# Patient Record
Sex: Male | Born: 1955 | Race: White | Hispanic: No | Marital: Married | Smoking: Current every day smoker
Health system: Southern US, Community
[De-identification: ages and names within clinical notes are randomized; demographics above are authoritative.]

## PROBLEM LIST (undated history)

## (undated) DIAGNOSIS — I719 Aortic aneurysm of unspecified site, without rupture: Secondary | ICD-10-CM

## (undated) DIAGNOSIS — E119 Type 2 diabetes mellitus without complications: Secondary | ICD-10-CM

## (undated) DIAGNOSIS — Z87891 Personal history of nicotine dependence: Secondary | ICD-10-CM

## (undated) DIAGNOSIS — Z720 Tobacco use: Secondary | ICD-10-CM

## (undated) DIAGNOSIS — IMO0001 Reserved for inherently not codable concepts without codable children: Secondary | ICD-10-CM

## (undated) DIAGNOSIS — Z794 Long term (current) use of insulin: Secondary | ICD-10-CM

## (undated) DIAGNOSIS — G4733 Obstructive sleep apnea (adult) (pediatric): Secondary | ICD-10-CM

## (undated) DIAGNOSIS — R1030 Lower abdominal pain, unspecified: Secondary | ICD-10-CM

## (undated) DIAGNOSIS — M62830 Muscle spasm of back: Secondary | ICD-10-CM

## (undated) DIAGNOSIS — M25552 Pain in left hip: Secondary | ICD-10-CM

## (undated) DIAGNOSIS — R109 Unspecified abdominal pain: Secondary | ICD-10-CM

## (undated) DIAGNOSIS — Z0000 Encounter for general adult medical examination without abnormal findings: Secondary | ICD-10-CM

## (undated) DIAGNOSIS — F419 Anxiety disorder, unspecified: Secondary | ICD-10-CM

## (undated) DIAGNOSIS — R079 Chest pain, unspecified: Secondary | ICD-10-CM

## (undated) DIAGNOSIS — I2109 ST elevation (STEMI) myocardial infarction involving other coronary artery of anterior wall: Secondary | ICD-10-CM

## (undated) DIAGNOSIS — I4891 Unspecified atrial fibrillation: Secondary | ICD-10-CM

## (undated) DIAGNOSIS — F09 Unspecified mental disorder due to known physiological condition: Secondary | ICD-10-CM

## (undated) DIAGNOSIS — R030 Elevated blood-pressure reading, without diagnosis of hypertension: Secondary | ICD-10-CM

## (undated) HISTORY — PX: CORONARY ANGIOPLASTY: SHX604

## (undated) HISTORY — DX: Unspecified mental disorder due to known physiological condition: F09

## (undated) HISTORY — DX: Anxiety disorder, unspecified: F41.9

## (undated) HISTORY — DX: Unspecified atrial fibrillation: I48.91

## (undated) HISTORY — DX: Tobacco use: Z72.0

## (undated) HISTORY — DX: Chest pain, unspecified: R07.9

## (undated) HISTORY — DX: Reserved for inherently not codable concepts without codable children: IMO0001

## (undated) HISTORY — DX: Elevated blood-pressure reading, without diagnosis of hypertension: R03.0

---

## 2005-12-08 HISTORY — PX: LAPAROSCOPIC CHOLECYSTECTOMY: SUR755

## 2006-02-02 ENCOUNTER — Emergency Department (HOSPITAL_COMMUNITY): Admission: EM | Admit: 2006-02-02 | Discharge: 2006-02-02 | Payer: Self-pay | Admitting: Emergency Medicine

## 2006-05-27 ENCOUNTER — Ambulatory Visit: Payer: Self-pay | Admitting: Cardiology

## 2006-10-19 ENCOUNTER — Emergency Department (HOSPITAL_COMMUNITY): Admission: EM | Admit: 2006-10-19 | Discharge: 2006-10-19 | Payer: Self-pay | Admitting: Emergency Medicine

## 2008-12-08 HISTORY — PX: CORONARY ARTERY BYPASS GRAFT: SHX141

## 2009-05-30 ENCOUNTER — Encounter: Payer: Self-pay | Admitting: Cardiology

## 2009-05-30 ENCOUNTER — Inpatient Hospital Stay (HOSPITAL_COMMUNITY): Admission: EM | Admit: 2009-05-30 | Discharge: 2009-05-30 | Payer: Self-pay | Admitting: Emergency Medicine

## 2009-06-07 DIAGNOSIS — I2109 ST elevation (STEMI) myocardial infarction involving other coronary artery of anterior wall: Secondary | ICD-10-CM

## 2009-06-07 HISTORY — PX: CARDIAC CATHETERIZATION: SHX172

## 2009-06-07 HISTORY — DX: ST elevation (STEMI) myocardial infarction involving other coronary artery of anterior wall: I21.09

## 2009-06-12 ENCOUNTER — Inpatient Hospital Stay (HOSPITAL_COMMUNITY): Admission: EM | Admit: 2009-06-12 | Discharge: 2009-06-19 | Payer: Self-pay | Admitting: Emergency Medicine

## 2009-06-12 ENCOUNTER — Encounter (INDEPENDENT_AMBULATORY_CARE_PROVIDER_SITE_OTHER): Payer: Self-pay | Admitting: *Deleted

## 2009-06-13 ENCOUNTER — Encounter: Payer: Self-pay | Admitting: Cardiology

## 2009-06-13 ENCOUNTER — Encounter (INDEPENDENT_AMBULATORY_CARE_PROVIDER_SITE_OTHER): Payer: Self-pay | Admitting: Cardiovascular Disease

## 2009-06-13 ENCOUNTER — Ambulatory Visit: Payer: Self-pay | Admitting: Cardiothoracic Surgery

## 2009-06-15 ENCOUNTER — Encounter: Payer: Self-pay | Admitting: Cardiothoracic Surgery

## 2009-06-15 ENCOUNTER — Encounter: Payer: Self-pay | Admitting: Cardiology

## 2009-06-16 ENCOUNTER — Encounter: Payer: Self-pay | Admitting: Cardiology

## 2009-06-19 ENCOUNTER — Encounter: Payer: Self-pay | Admitting: Cardiothoracic Surgery

## 2009-07-18 ENCOUNTER — Encounter: Admission: RE | Admit: 2009-07-18 | Discharge: 2009-07-18 | Payer: Self-pay | Admitting: Cardiothoracic Surgery

## 2009-07-18 ENCOUNTER — Encounter: Payer: Self-pay | Admitting: Cardiology

## 2009-07-18 ENCOUNTER — Ambulatory Visit: Payer: Self-pay | Admitting: Cardiothoracic Surgery

## 2009-12-17 ENCOUNTER — Encounter: Payer: Self-pay | Admitting: Cardiology

## 2009-12-23 ENCOUNTER — Encounter: Payer: Self-pay | Admitting: Cardiology

## 2009-12-25 ENCOUNTER — Encounter: Payer: Self-pay | Admitting: Cardiology

## 2009-12-26 DIAGNOSIS — I429 Cardiomyopathy, unspecified: Secondary | ICD-10-CM

## 2009-12-26 DIAGNOSIS — F4323 Adjustment disorder with mixed anxiety and depressed mood: Secondary | ICD-10-CM

## 2009-12-26 DIAGNOSIS — I472 Ventricular tachycardia, unspecified: Secondary | ICD-10-CM | POA: Insufficient documentation

## 2009-12-26 DIAGNOSIS — F17200 Nicotine dependence, unspecified, uncomplicated: Secondary | ICD-10-CM | POA: Insufficient documentation

## 2009-12-26 DIAGNOSIS — R072 Precordial pain: Secondary | ICD-10-CM | POA: Insufficient documentation

## 2009-12-28 ENCOUNTER — Ambulatory Visit: Payer: Self-pay | Admitting: Cardiology

## 2009-12-28 DIAGNOSIS — R0989 Other specified symptoms and signs involving the circulatory and respiratory systems: Secondary | ICD-10-CM | POA: Insufficient documentation

## 2009-12-28 DIAGNOSIS — I2589 Other forms of chronic ischemic heart disease: Secondary | ICD-10-CM

## 2010-01-01 ENCOUNTER — Encounter: Payer: Self-pay | Admitting: Cardiology

## 2010-01-07 ENCOUNTER — Encounter (INDEPENDENT_AMBULATORY_CARE_PROVIDER_SITE_OTHER): Payer: Self-pay | Admitting: *Deleted

## 2010-01-23 ENCOUNTER — Encounter: Payer: Self-pay | Admitting: Cardiology

## 2010-01-28 ENCOUNTER — Ambulatory Visit: Payer: Self-pay | Admitting: Cardiology

## 2010-02-28 IMAGING — CR DG CHEST 1V PORT
1 series · 1 of 1 positions shown · non-contrast
Comparison: Portable exam 1115 hours compared to 05/30/2009

CLINICAL DATA: Unstable angina, chest pain, left arm numbness

PORTABLE CHEST - 1 VIEW

[AP]
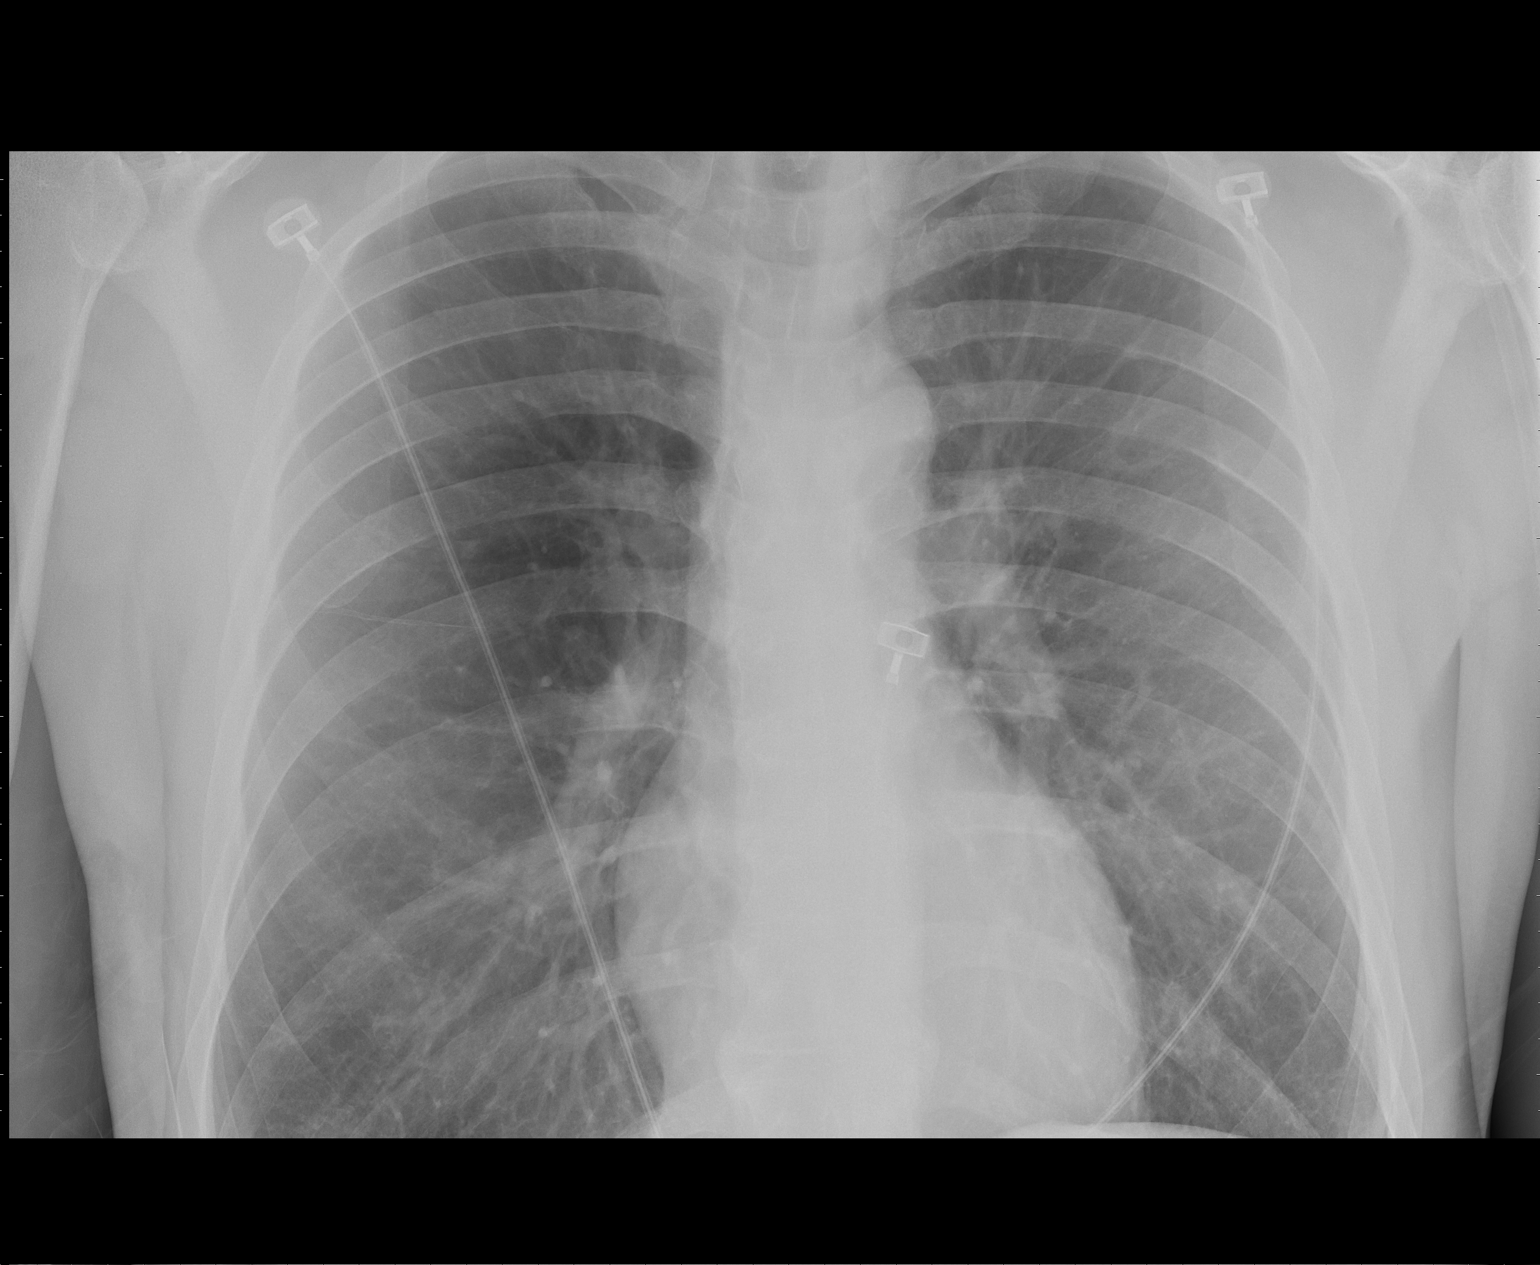

[1 of 1 positions shown; findings below may reference images not displayed]

FINDINGS: Normal heart size, mediastinal contours, pulmonary vascularity.
Lungs clear.
Question mild underlying emphysematous changes.
IMPRESSION: No acute abnormalities.

## 2010-03-06 IMAGING — CR DG CHEST 2V
2 series · 2 of 2 positions shown · non-contrast
Comparison: 06/17/2009

CLINICAL DATA: Coronary artery disease. Status post CABG.

CHEST - 2 VIEW

[w chest pa]
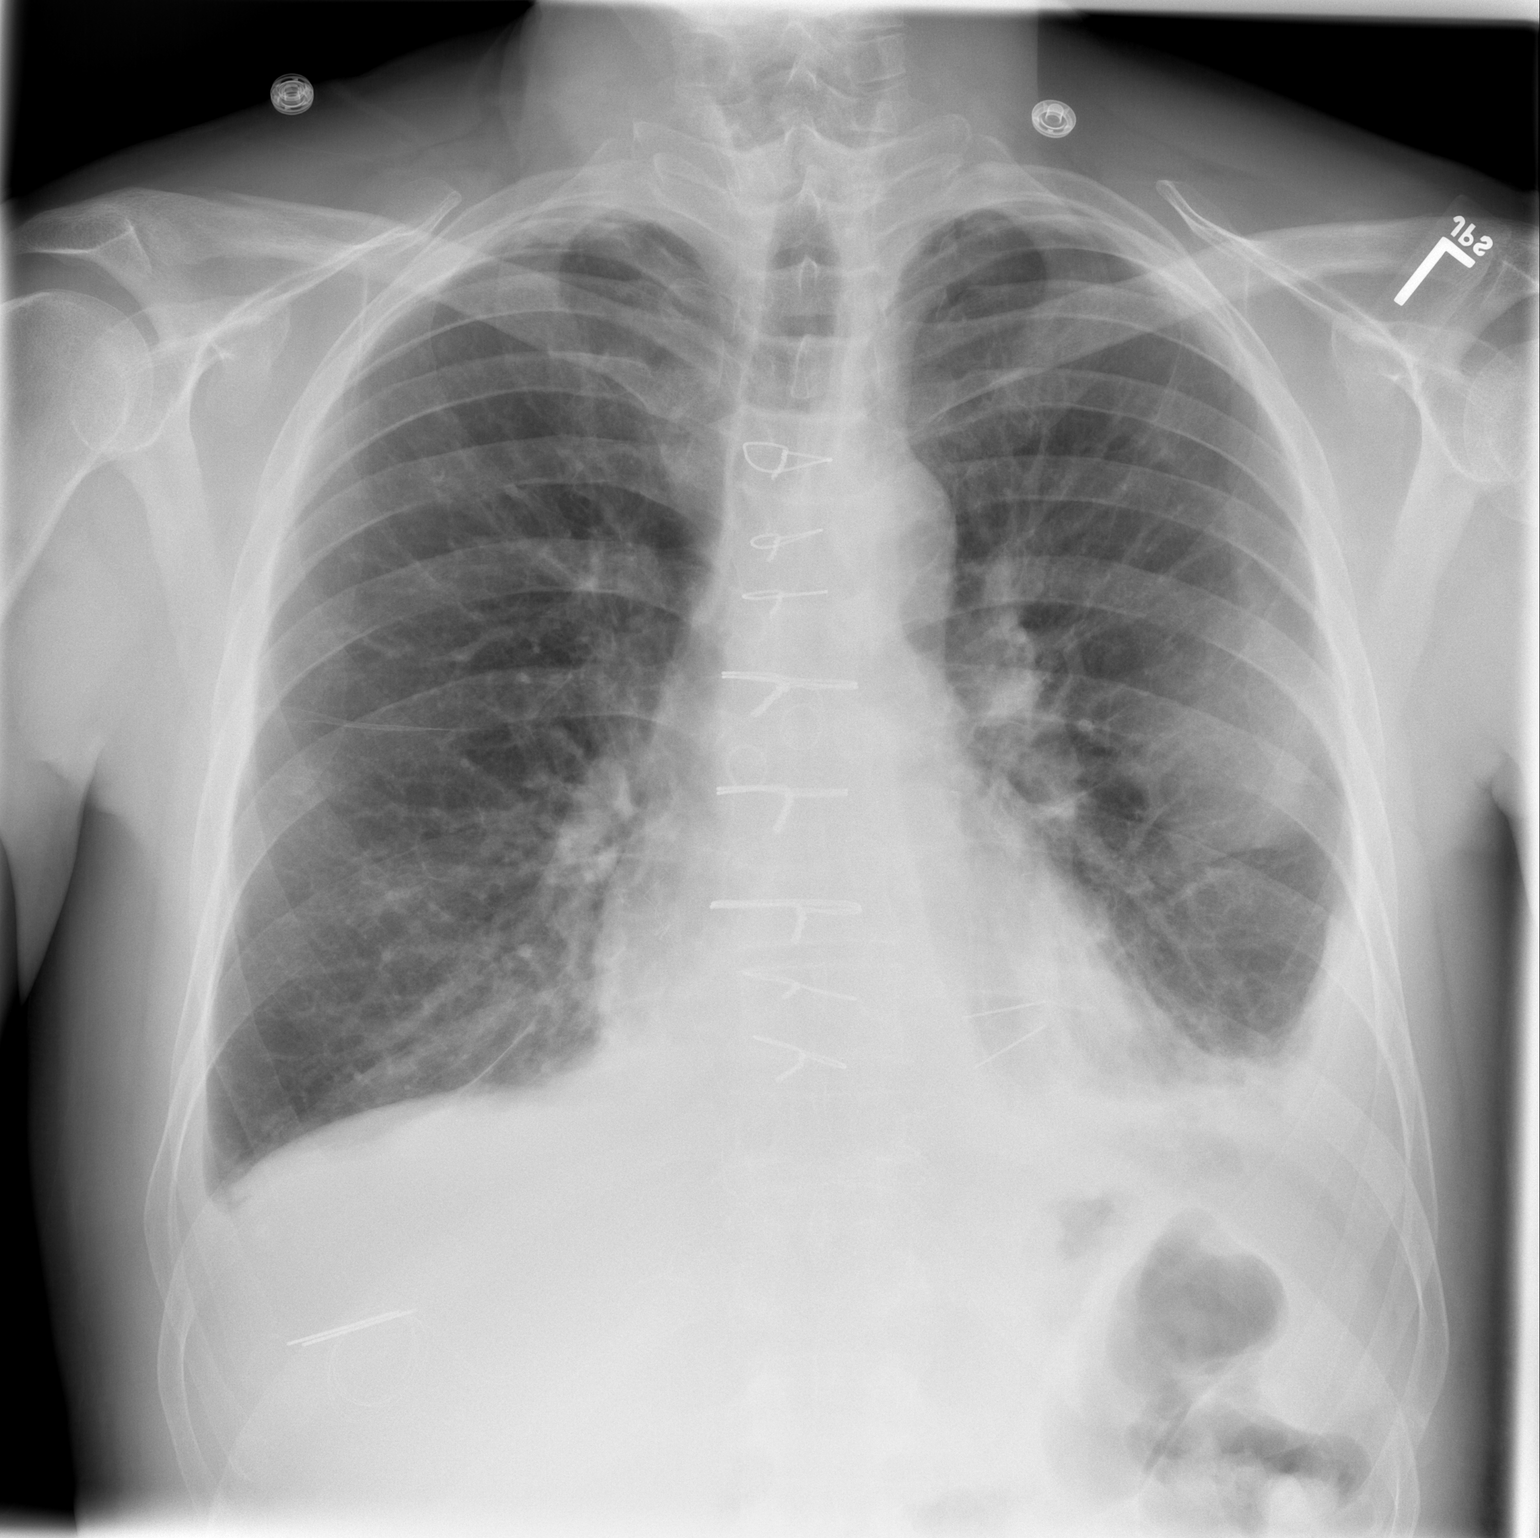

[w chest lat]
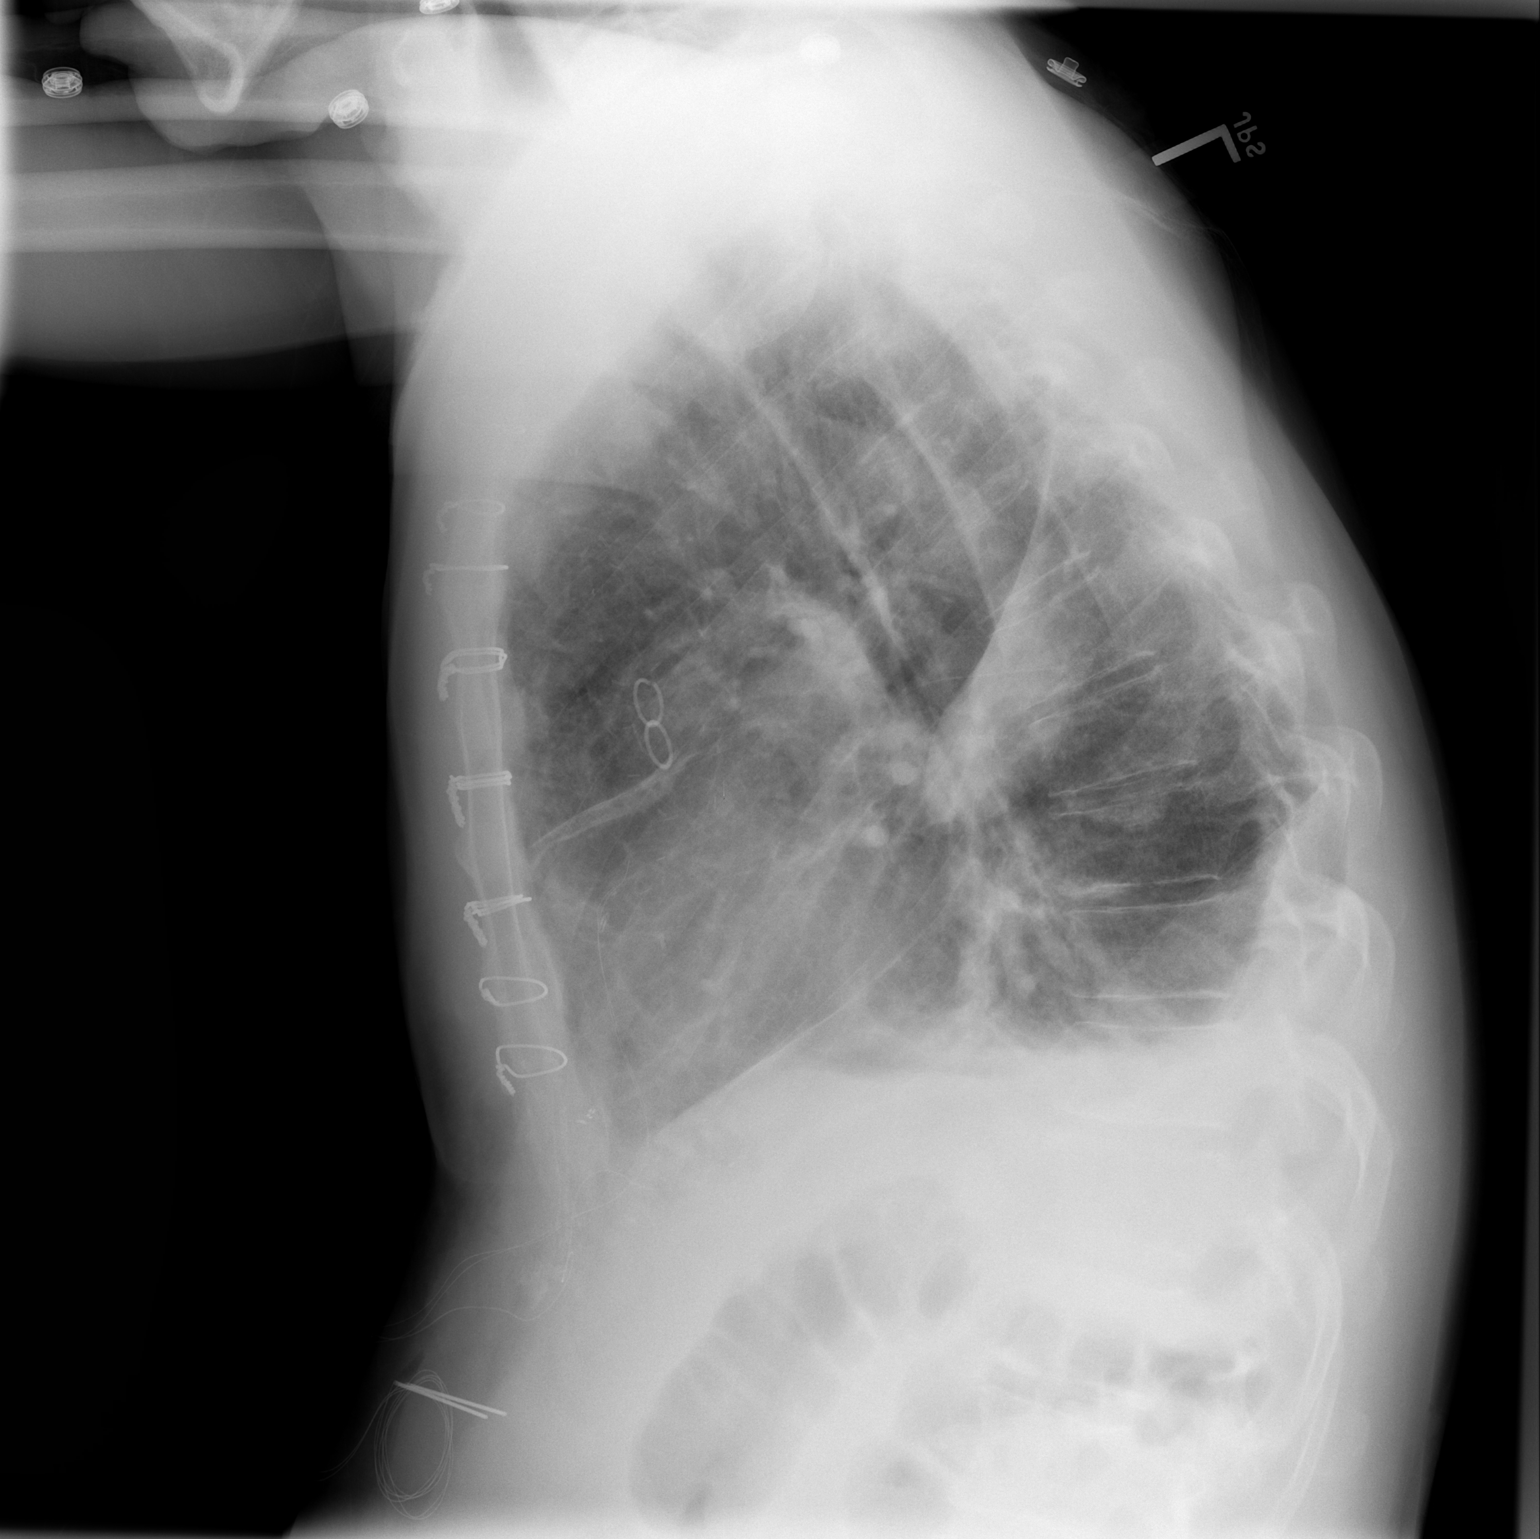

[2 of 2 positions shown; findings below may reference images not displayed]

FINDINGS: The left-sided chest tube has been removed.  There is no
pneumothorax.

The small left effusion has slightly increased.  There is fluid
loculated along the posterior-superior aspect of the left major
fissure.  There is a small right effusion.  Heart size and
vascularity are normal.  There is mild atelectasis at the bases
medially.
IMPRESSION: No pneumothorax after chest tube removal.  Small effusions.  Mild
bibasilar atelectasis.

## 2011-01-09 NOTE — Letter (Signed)
Summary: Discharge Summary  Discharge Summary   Imported By: Zachary George 12/27/2009 11:35:00  _____________________________________________________________________  External Attachment:    Type:   Image     Comment:   External Document

## 2011-01-09 NOTE — Letter (Signed)
Summary: Discharge Summary  Discharge Summary   Imported By: Zachary George 12/27/2009 11:35:54  _____________________________________________________________________  External Attachment:    Type:   Image     Comment:   External Document

## 2011-01-09 NOTE — Letter (Signed)
Summary: External Correspondence/ OFFICE NOTE DR. VYAS  External Correspondence/ OFFICE NOTE DR. VYAS   Imported By: Dorise Hiss 12/26/2009 16:35:31  _____________________________________________________________________  External Attachment:    Type:   Image     Comment:   External Document

## 2011-01-09 NOTE — Procedures (Signed)
Summary: Holter and Event  Holter and Event   Imported By: Zachary George 12/26/2009 16:34:16  _____________________________________________________________________  External Attachment:    Type:   Image     Comment:   External Document

## 2011-01-09 NOTE — Assessment & Plan Note (Signed)
Summary: 1 MO FU -SRS   Visit Type:  Follow-up Primary Provider:  Sherril Croon  CC:  follow-up visit.  History of Present Illness: the patient is a 55 year old male with a history of an ischemic cardiomyopathy ejection fraction 40%. The patient is status large anterior wall myocardial infarction July 2010. The patient underwent coronary artery bypass grafting. Preoperatively his ejection fraction was 25%. Last office visit the patient was found to have a loud carotid bruits and carotid Dopplers were obtained. However, it was less than 50% stenosis bilaterally. The patient also reported episodes of dizziness and a CardioNet monitor was applied by his primary care physician. There was only a single episode of a 5 beat run of ventricular tachycardia with a heart rate of 192 beats per minute at that time the patient was asymptomatic. The patient also had junctional rhythm at 30 beats per minute in the early morning hours while asleep. The patient currently reports no palpitations or syncope. He denies any chest pain. He is just recovering from a recent upper respiratory tract infection and has been prescribed antibiotics by his primary care physician. The patient continues to smoke but is trying to quit.  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 Take 1 Tablet By Mouth Once A Day 2)  Carvedilol 3.125 Mg Tabs (Carvedilol) .... Take 1 Tablet By Mouth Two Times A Day 3)   Aspir-Trin 325 Mg Tbec (Aspirin) .... Take 1 Tablet By Mouth Once A Day 4)  Lisinopril 10 Mg Tabs (Lisinopril) .... Take 1 Tablet By Mouth Once A Day 5)  Amoxicillin 500 Mg Caps (Amoxicillin) .... Take 1 Tablet By Mouth Three Times A Day With Food For Ten Days  Allergies (verified): No Known Drug Allergies  Comments:  Nurse/Medical Assistant: The patient's medications and allergies were reviewed with the patient and were updated in the Medication and Allergy Lists. Bottles reviewed.  Past History:  Past Medical History: Last updated: 12/26/2009 chest pains elevated blood pressure Anxiety disorder Tobacco abuse Chronic brain syndrome per patient  Past Surgical History: Last updated: 12/26/2009 CABG x 3 Cholecystectomy 2007 Cardiac Catheterization  Family History: Last updated: 12/26/2009 + family hx of CAD  Social History: Last updated: 12/26/2009 Married with 2 children Unemployed smokes 2 packs of cigaretts per day He uses marijuana frequently  Risk Factors: Smoking Status: current (01/28/2010) Packs/Day: 1 PPD (01/28/2010)  Review of Systems  The patient denies fatigue, malaise, fever, weight gain/loss, vision loss, decreased hearing, hoarseness, chest pain, palpitations, shortness of breath, prolonged cough, wheezing, sleep apnea, coughing up blood, abdominal pain, blood in stool, nausea, vomiting, diarrhea, heartburn, incontinence, blood in urine, muscle weakness, joint pain, leg swelling, rash, skin lesions, headache, fainting, dizziness, depression, anxiety, enlarged lymph nodes, easy bruising or bleeding, and environmental allergies.    Vital Signs:  Patient profile:   55 year old male Height:      69 inches Weight:      176 pounds Pulse rate:   87 / minute BP  sitting:   108 / 71  (left arm) Cuff size:   regular  Vitals Entered By: Carlye Grippe (January 28, 2010 12:54 PM) CC: follow-up visit   Physical Exam  Additional Exam:  General:  Well-developed, well-nourished in no distress head: Normocephalic and atraumatic eyes PERRLA/EOMI intact, conjunctiva and lids normal nose: No deformity or lesions mouth normal dentition, normal posterior pharynx neck: Supple, no JVD.  No masses, thyromegaly or abnormal cervical nodes. soft bilateral carotid bruits lungs: Normal breath sounds bilaterally without wheezing.  Normal percussion heart: regular rate and rhythm with normal S1 and S2, no S3 or S4.  PMI is normal.  No pathological murmurs abdomen: Normal bowel sounds, abdomen is soft and nontender without masses, organomegaly or hernias noted.  No hepatosplenomegaly musculoskeletal: Back normal, normal gait muscle strength and tone normal pulsus: Pulse is normal in all 4 extremities Extremities: No peripheral pitting edema neurologic: Alert and oriented x 3 skin: Intact without lesions or rashes cervical nodes: No significant adenopathy psychologic: Normal affect    Impression & Recommendations:  Problem # 1:  CARDIOMYOPATHY, ISCHEMIC (ICD-414.8) the patient has no evidence of congestive heart failure. Will continue on his current medical regimen. He has had significant improvement in his ejection fraction postoperatively. The patient is also statin drug therapy and blood work can be done by his primary care physician. His updated medication list for this problem includes:    Carvedilol 3.125 Mg Tabs (Carvedilol) .Marland Kitchen... Take 1 tablet by mouth two times a day    Aspir-trin 325 Mg Tbec (Aspirin) .Marland Kitchen... Take 1 tablet by mouth once a day    Lisinopril 10 Mg Tabs (Lisinopril) .Marland Kitchen... Take 1 tablet by mouth once a day  Problem # 2:  CAROTID BRUIT (ICD-785.9) no evidence of significant carotid artery disease. Continue risk factor modification.  Problem # 3:  NONDEPENDENT TOBACCO USE DISORDER (ICD-305.1) the patient was counseled extensively regarding tobacco discontinuation.  Patient Instructions: 1)  Your physician recommends that  you continue on your current medications as directed. Please refer to the Current Medication list given to you today. 2)  Your physician wants you to follow-up in: 6months. You will receive a reminder letter in the mail about two months in advance. If you don't receive a letter, please call our office to schedule the follow-up appointment.

## 2011-01-09 NOTE — Consult Note (Signed)
Summary: Consultation Report  Consultation Report   Imported By: Zachary George 12/27/2009 11:36:36  _____________________________________________________________________  External Attachment:    Type:   Image     Comment:   External Document

## 2011-01-09 NOTE — Cardiovascular Report (Signed)
Summary: Cardiac Cath Other  Cardiac Cath Other   Imported By: Zachary George 12/27/2009 11:45:43  _____________________________________________________________________  External Attachment:    Type:   Image     Comment:   External Document

## 2011-01-09 NOTE — Assessment & Plan Note (Signed)
Summary: EST LAST SEEN 2006 CARDIOMYOPATHY   Visit Type:  Initial Consult Primary Provider:  Vyas  CC:  new patient and abnormal heart functin.  History of Present Illness: the patient is a 55 year old Dave [name] with a history of an ischemic cardiomyopathy ejection fraction currently 40%. The patient is status a large anterior wall myocardial infarction in July of 2010. He said single he underwent coronary artery bypass grafting. His ejection fraction at that time was 25%. 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 The patient does have carotid bruits on exam today. He [facility]. The patient also reported dizziness over the last several weeks. The patient was scheduled for a 2-D echocardiogram was scheduled by his primary care physician. The study was read [facility] Ejection fraction was 40% with no mention was made of focal wall motion abnormalities. The patient then also wore a CardioNet monitor which demonstrated a single episode of 5 beat run of ventricular tachycardia with heart rate of 192 beats per minute. However patient was asymptomatic. No dizziness was reported. Another strip also reveals a junctional rhythm at a heart rate of 33 beats per minute but this was recorded early in the morning and the patient was asleep. Of note is also that the patient has significant anxiety and smokes marijuana, for medical purposes. He does smoke tobacco. He denies any recent presyncope or syncope. He has had no heart failure symptoms with no shortness of breath orthopnea PND.  Preventive Screening-Counseling & Management  Alcohol-Tobacco     Smoking Status: current     Smoking Cessation Counseling: yes     Packs/Day: 1 PPD  Clinical Review Panels:  CXR CXR results  Clinical Data: Post CABG for chest  pain    CHEST - 2 VIEW    Comparison: 06/18/2009    Findings: Post CABG.  Heart normal.  Lungs have cleared.  No   pleural fluid.  Osseous structures intact.    IMPRESSION:   Post CABG - no active disease.       Released By:  Bernerd Limbo,  M.D. (07/18/2009)  Echocardiogram Echocardiogram Echocardiography  Study Conclusions    1. Left ventricle: The cavity size was normal. Wall thickness was      normal. Systolic function was normal. The estimated ejection      fraction was in the range of 50% to 55%. Wall motion was normal;      there were no regional wall motion abnormalities.   2. Mitral valve: Calcified annulus. Mild regurgitation.   Echocardiography. M-mode, complete 2D, spectral Doppler, and color   Doppler. Height: Height: 175cm. Height: 68.9in. Weight: Weight:   76kg. Weight: 167.2lb. Body mass index: BMI: Dave.8kg/m^2. Body   surface area: BSA: 1.83m^2. Patient status: Inpatient. Location:   (06/13/2009)  Cardiac Imaging Cardiac Cath Findings  CARDIAC CATHETERIZATION   IMPRESSION:   1. Severe global left ventricular dysfunction with an ejection       fraction of approximately 25% with significant hypocontractility to       akinesis involving an inferior wall hypocontractility       anterolaterally and significant hypocontractility in the       inferolateral wall.   2. Significant multivessel coronary obstructive disease with separate       ostium giving rise to the left anterior descending and left  circumflex vessels with diffuse 70% ostial proximal left anterior       descending stenosis, 60% left anterior descending stenosis after       the takeoff of the first septal and diagonal vessel and bifurcation       95% and 80% septal mid left anterior descending stenosis.   3. Large circumflex system with questionable ostial catheter spasm.   4. Total occlusion of the right coronary artery with extensive left-to-       right collaterals.   5. An 80% left  vertebral artery narrowing arising from the left       circumflex system with evidence for plaque in the proximal       subclavian system with a widely patent unbypassed left internal       mammary artery.   6. 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      RECOMMENDATIONS:  The patient will be evaluated for consideration of   CABG revascularization surgery.           (06/12/2009)    Current Medications (verified): 1)  Simvastatin 40 Mg Tabs (Simvastatin) .... Take 1 Tablet By Mouth Once A Day 2)  Carvedilol 3.125 Mg Tabs (Carvedilol) .... Take 1 Tablet By Mouth Two Times A Day 3)  Aspir-Trin 325 Mg Tbec (Aspirin) .... Take 1 Tablet By Mouth Once A Day 4)  Lisinopril 10 Mg Tabs (Lisinopril) .... Take 1 Tablet By Mouth Once A Day  Allergies (verified): No Known Drug Allergies  Comments:  Nurse/Medical Assistant: The patient's medications and allergies were reviewed with the patient and were updated in the Medication and Allergy Lists. Bottlles brought.  Past History:  Past Medical History: Last updated: 12/26/2009 chest pains elevated blood pressure Anxiety disorder Tobacco abuse Chronic brain syndrome per patient  Past Surgical History: Last updated: 12/26/2009 CABG x 3 Cholecystectomy 2007 Cardiac Catheterization  Family History: Last updated: 12/26/2009 + family hx of CAD  Social History: Last updated: 12/26/2009 Married with 2 children Unemployed smokes 2 packs of cigaretts per day He uses marijuana frequently  Risk Factors: Smoking Status: current (12/28/2009) Packs/Day: 1 PPD (12/28/2009)  Social History: Smoking Status:  current Packs/Day:  1 PPD  Review of Systems       The patient complains of dizziness.  The patient denies fatigue, malaise, fever, weight gain/loss, vision loss, decreased hearing, hoarseness, chest pain, palpitations, shortness of breath, prolonged cough,  wheezing, sleep apnea, coughing up blood, abdominal pain, blood in stool, nausea, vomiting, diarrhea, heartburn, incontinence, blood in urine, muscle weakness, joint pain, leg swelling, rash, skin lesions, headache, fainting, depression, anxiety, enlarged lymph nodes, easy bruising or bleeding, and environmental allergies.    Vital Signs:  Patient profile:   55 year old Dave [name] Height:      69 inches Weight:      185 pounds BMI:     27.42 Pulse rate:   79 / minute BP sitting:   127 / 84  (left arm) Cuff size:   regular  Vitals Entered By: Carlye Grippe (December 28, 2009 8:10 AM)  Nutrition Counseling: Patient's BMI is greater than 25 and therefore counseled on weight management options. CC: new patient,abnormal heart functin   Physical Exam  Additional Exam:  General: Well-developed, well-nourished in no distress head: Normocephalic and atraumatic eyes PERRLA/EOMI intact, conjunctiva and lids normal nose: No deformity or lesions mouth normal dentition, normal posterior pharynx neck: Supple, no  JVD.  No masses, thyromegaly or abnormal cervical nodes. Bilateral carotid bruits left greater than right lungs: Normal breath sounds bilaterally without wheezing.  Normal percussion heart: regular rate and rhythm with normal S1 and S2, no S3 or S4.  PMI is normal.  No pathological murmurs abdomen: Normal bowel sounds, abdomen is soft and nontender without masses, organomegaly or hernias noted.  No hepatosplenomegaly musculoskeletal: Back normal, normal gait muscle strength and tone normal pulsus: Pulse is normal in all 4 extremities Extremities: No peripheral pitting edema neurologic: Alert and oriented x 3 skin: Intact without lesions or rashes cervical nodes: No significant adenopathy psychologic: Normal affect    Impression & Recommendations:  Problem # 1:  CAROTID BRUIT (ICD-785.9) the patient will be scheduled for followup carotid Dopplers. He has a significant bruit particularly  on the left side. He has known disease of 60-79% proximal 6 months ago. Orders: Carotid Duplex (Carotid Duplex)  Problem # 2:  PAROXYSMAL VENTRICULAR TACHYCARDIA (ICD-427.1) the patient has a reported strip with nonsustained VT of only 5 beats. He was asymptomatic. His ejection fraction is currently 40% currently no clear indication for ICD placement. The patient however will need a followup echocardiogram in approximately in 6 months. I would prefer this performed inhospital and read  by our group. I am concerned that no mention was made of an anterior wall motion abnormality. His updated medication list for this problem includes:    Carvedilol 3.125 Mg Tabs (Carvedilol) .Marland Kitchen... Take 1 tablet by mouth two times a day    Aspir-trin 325 Mg Tbec (Aspirin) .Marland Kitchen... Take 1 tablet by mouth once a day    Lisinopril 10 Mg Tabs (Lisinopril) .Marland Kitchen... Take 1 tablet by mouth once a day  Orders: T-Basic Metabolic Panel [id number])  Problem # 3:  CARDIOMYOPATHY, ISCHEMIC (ICD-414.8) lisinopril will be up titrated to 10 mg p.o. q. day. His updated medication list for this problem includes:    Carvedilol 3.125 Mg Tabs (Carvedilol) .Marland Kitchen... Take 1 tablet by mouth two times a day    Aspir-trin 325 Mg Tbec (Aspirin) .Marland Kitchen... Take 1 tablet by mouth once a day    Lisinopril 10 Mg Tabs (Lisinopril) .Marland Kitchen... Take 1 tablet by mouth once a day  Orders: T-Basic Metabolic Panel [id number])  Problem # 4:  ADJ DISORDER WITH MIXED ANXIETY & DEPRESSED MOOD (ICD-309.28) the patient significant anxiety and uses marijuana for relief. I encouraged the patient to stop the use of marijuana as well has tobacco.  Patient Instructions: 1)  Lisinopril 10mg  daily 2)  Carotid dopplers  3)  Labs:  BMET 4)  Follow up in  1 month Prescriptions: LISINOPRIL 10 MG TABS (LISINOPRIL) Take 1 tablet by mouth once a day  #30 x 6   Entered by:   Hoover Brunette, LPN   Authorized by:   Lewayne Bunting, MD, [facility]   Signed by:   Hoover Brunette, LPN on 83/15/1761    Method used:   Electronically to        [facility] [address] (retail)       [address]       [place], [place]  [address]       Ph: [phone] or [phone]       Fax: [phone]   RxID:   [id number]

## 2011-01-09 NOTE — Letter (Signed)
Summary: Engineer, materials at [facility]  [address]   [place], [place] [address]   Phone: [phone]  Fax: [phone]        January 07, 2010 MRN: [id number]   Dave [name] [address] [place], [place]  [address]   Dear Mr. PERZ,  Your test ordered by Selena Batten has been reviewed by your physician (or physician assistant) and was found to be normal or stable. Your physician (or physician assistant) felt no changes were needed at this time.  ____ Echocardiogram  ____ Cardiac Stress Test  __X__ Lab Work  ____ Peripheral vascular study of arms, legs or neck  ____ CT scan or X-ray  ____ Lung or Breathing test  ____ Other:   Thank you.   Hoover Brunette, LPN    Duane Boston, M.D., F.A.C.C. Thressa Sheller, M.D., F.A.C.C. Oneal Grout, M.D., F.A.C.C. Cheree Ditto, M.D., F.A.C.C. Daiva Nakayama, M.D., F.A.C.C. Kenney Houseman, M.D., F.A.C.C. Jeanne Ivan, PA-C

## 2011-01-09 NOTE — Op Note (Signed)
Summary: Operative Report  Operative Report   Imported By: Zachary George 12/27/2009 11:37:24  _____________________________________________________________________  External Attachment:    Type:   Image     Comment:   External Document

## 2011-01-09 NOTE — Progress Notes (Signed)
Summary: TCT OFFICE VISIT  TCT OFFICE VISIT   Imported By: Zachary George 12/26/2009 16:49:16  _____________________________________________________________________  External Attachment:    Type:   Image     Comment:   External Document

## 2011-03-16 LAB — BLOOD GAS, ARTERIAL
Acid-base deficit: 0.3 mmol/L (ref 0.0–2.0)
Bicarbonate: 23.6 mEq/L (ref 20.0–24.0)
FIO2: 0.21 %
O2 Saturation: 95.6 %
Patient temperature: 98.6
TCO2: 24.7 mmol/L (ref 0–100)
pCO2 arterial: 36.9 mmHg (ref 35.0–45.0)
pH, Arterial: 7.421 (ref 7.350–7.450)
pO2, Arterial: 73 mmHg — ABNORMAL LOW (ref 80.0–100.0)

## 2011-03-16 LAB — TSH: TSH: 2.107 u[IU]/mL (ref 0.350–4.500)

## 2011-03-16 LAB — POCT I-STAT 4, (NA,K, GLUC, HGB,HCT)
Glucose, Bld: 103 mg/dL — ABNORMAL HIGH (ref 70–99)
Glucose, Bld: 105 mg/dL — ABNORMAL HIGH (ref 70–99)
Glucose, Bld: 81 mg/dL (ref 70–99)
Glucose, Bld: 81 mg/dL (ref 70–99)
Glucose, Bld: 82 mg/dL (ref 70–99)
Glucose, Bld: 84 mg/dL (ref 70–99)
Glucose, Bld: 91 mg/dL (ref 70–99)
HCT: 22 % — ABNORMAL LOW (ref 39.0–52.0)
HCT: 25 % — ABNORMAL LOW (ref 39.0–52.0)
HCT: 25 % — ABNORMAL LOW (ref 39.0–52.0)
HCT: 26 % — ABNORMAL LOW (ref 39.0–52.0)
HCT: 31 % — ABNORMAL LOW (ref 39.0–52.0)
HCT: 34 % — ABNORMAL LOW (ref 39.0–52.0)
HCT: 37 % — ABNORMAL LOW (ref 39.0–52.0)
Hemoglobin: 10.5 g/dL — ABNORMAL LOW (ref 13.0–17.0)
Hemoglobin: 11.6 g/dL — ABNORMAL LOW (ref 13.0–17.0)
Hemoglobin: 12.6 g/dL — ABNORMAL LOW (ref 13.0–17.0)
Hemoglobin: 7.5 g/dL — CL (ref 13.0–17.0)
Hemoglobin: 8.5 g/dL — ABNORMAL LOW (ref 13.0–17.0)
Hemoglobin: 8.5 g/dL — ABNORMAL LOW (ref 13.0–17.0)
Hemoglobin: 8.8 g/dL — ABNORMAL LOW (ref 13.0–17.0)
Potassium: 3.7 mEq/L (ref 3.5–5.1)
Potassium: 3.8 meq/L (ref 3.5–5.1)
Potassium: 3.8 meq/L (ref 3.5–5.1)
Potassium: 4 mEq/L (ref 3.5–5.1)
Potassium: 4.1 mEq/L (ref 3.5–5.1)
Potassium: 4.1 meq/L (ref 3.5–5.1)
Potassium: 4.6 meq/L (ref 3.5–5.1)
Sodium: 131 mEq/L — ABNORMAL LOW (ref 135–145)
Sodium: 131 meq/L — ABNORMAL LOW (ref 135–145)
Sodium: 132 meq/L — ABNORMAL LOW (ref 135–145)
Sodium: 134 meq/L — ABNORMAL LOW (ref 135–145)
Sodium: 136 mEq/L (ref 135–145)
Sodium: 137 mEq/L (ref 135–145)
Sodium: 137 meq/L (ref 135–145)

## 2011-03-16 LAB — PROTIME-INR
INR: 1.4 (ref 0.00–1.49)
Prothrombin Time: 17.4 seconds — ABNORMAL HIGH (ref 11.6–15.2)

## 2011-03-16 LAB — PLATELET FUNCTION ASSAY: Collagen / Epinephrine: 127 s (ref 0–180)

## 2011-03-16 LAB — URINALYSIS, ROUTINE W REFLEX MICROSCOPIC
Bilirubin Urine: NEGATIVE
Glucose, UA: NEGATIVE mg/dL
Hgb urine dipstick: NEGATIVE
Ketones, ur: NEGATIVE mg/dL
Nitrite: NEGATIVE
Protein, ur: NEGATIVE mg/dL
Specific Gravity, Urine: 1.019 (ref 1.005–1.030)
Urobilinogen, UA: 0.2 mg/dL (ref 0.0–1.0)
pH: 6.5 (ref 5.0–8.0)

## 2011-03-16 LAB — CBC
HCT: 27 % — ABNORMAL LOW (ref 39.0–52.0)
HCT: 27.6 % — ABNORMAL LOW (ref 39.0–52.0)
HCT: 27.8 % — ABNORMAL LOW (ref 39.0–52.0)
HCT: 30.1 % — ABNORMAL LOW (ref 39.0–52.0)
HCT: 30.5 % — ABNORMAL LOW (ref 39.0–52.0)
HCT: 30.8 % — ABNORMAL LOW (ref 39.0–52.0)
HCT: 39.3 % (ref 39.0–52.0)
HCT: 39.4 % (ref 39.0–52.0)
Hemoglobin: 10.4 g/dL — ABNORMAL LOW (ref 13.0–17.0)
Hemoglobin: 10.7 g/dL — ABNORMAL LOW (ref 13.0–17.0)
Hemoglobin: 10.8 g/dL — ABNORMAL LOW (ref 13.0–17.0)
Hemoglobin: 13.4 g/dL (ref 13.0–17.0)
Hemoglobin: 13.6 g/dL (ref 13.0–17.0)
Hemoglobin: 14.2 g/dL (ref 13.0–17.0)
Hemoglobin: 9.4 g/dL — ABNORMAL LOW (ref 13.0–17.0)
Hemoglobin: 9.6 g/dL — ABNORMAL LOW (ref 13.0–17.0)
Hemoglobin: 9.8 g/dL — ABNORMAL LOW (ref 13.0–17.0)
MCHC: 34.4 g/dL (ref 30.0–36.0)
MCHC: 34.5 g/dL (ref 30.0–36.0)
MCHC: 34.8 g/dL (ref 30.0–36.0)
MCHC: 34.9 g/dL (ref 30.0–36.0)
MCHC: 35 g/dL (ref 30.0–36.0)
MCHC: 35 g/dL (ref 30.0–36.0)
MCHC: 35.2 g/dL (ref 30.0–36.0)
MCV: 90.2 fL (ref 78.0–100.0)
MCV: 90.2 fL (ref 78.0–100.0)
MCV: 90.4 fL (ref 78.0–100.0)
MCV: 90.5 fL (ref 78.0–100.0)
MCV: 90.9 fL (ref 78.0–100.0)
MCV: 91.2 fL (ref 78.0–100.0)
MCV: 91.3 fL (ref 78.0–100.0)
MCV: 91.4 fL (ref 78.0–100.0)
MCV: 91.4 fL (ref 78.0–100.0)
Platelets: 108 10*3/uL — ABNORMAL LOW (ref 150–400)
Platelets: 117 10*3/uL — ABNORMAL LOW (ref 150–400)
Platelets: 120 10*3/uL — ABNORMAL LOW (ref 150–400)
Platelets: 126 10*3/uL — ABNORMAL LOW (ref 150–400)
Platelets: 126 10*3/uL — ABNORMAL LOW (ref 150–400)
Platelets: 128 10*3/uL — ABNORMAL LOW (ref 150–400)
RBC: 2.96 MIL/uL — ABNORMAL LOW (ref 4.22–5.81)
RBC: 3.05 MIL/uL — ABNORMAL LOW (ref 4.22–5.81)
RBC: 3.08 MIL/uL — ABNORMAL LOW (ref 4.22–5.81)
RBC: 3.31 MIL/uL — ABNORMAL LOW (ref 4.22–5.81)
RBC: 3.39 MIL/uL — ABNORMAL LOW (ref 4.22–5.81)
RBC: 3.41 MIL/uL — ABNORMAL LOW (ref 4.22–5.81)
RBC: 4.43 MIL/uL (ref 4.22–5.81)
RBC: 4.5 MIL/uL (ref 4.22–5.81)
RDW: 13.4 % (ref 11.5–15.5)
RDW: 13.4 % (ref 11.5–15.5)
RDW: 13.4 % (ref 11.5–15.5)
RDW: 13.6 % (ref 11.5–15.5)
RDW: 13.7 % (ref 11.5–15.5)
RDW: 13.9 % (ref 11.5–15.5)
RDW: 14.1 % (ref 11.5–15.5)
RDW: 14.2 % (ref 11.5–15.5)
WBC: 10.1 10*3/uL (ref 4.0–10.5)
WBC: 10.6 10*3/uL — ABNORMAL HIGH (ref 4.0–10.5)
WBC: 11.3 10*3/uL — ABNORMAL HIGH (ref 4.0–10.5)
WBC: 11.3 10*3/uL — ABNORMAL HIGH (ref 4.0–10.5)
WBC: 8.6 10*3/uL (ref 4.0–10.5)
WBC: 9.6 10*3/uL (ref 4.0–10.5)
WBC: 9.7 10*3/uL (ref 4.0–10.5)
WBC: 9.7 10*3/uL (ref 4.0–10.5)

## 2011-03-16 LAB — MAGNESIUM
Magnesium: 2 mg/dL (ref 1.5–2.5)
Magnesium: 2.1 mg/dL (ref 1.5–2.5)
Magnesium: 2.4 mg/dL (ref 1.5–2.5)

## 2011-03-16 LAB — COMPREHENSIVE METABOLIC PANEL
ALT: 17 U/L (ref 0–53)
AST: 22 U/L (ref 0–37)
Albumin: 3.6 g/dL (ref 3.5–5.2)
Alkaline Phosphatase: 65 U/L (ref 39–117)
BUN: 14 mg/dL (ref 6–23)
CO2: 29 mEq/L (ref 19–32)
Calcium: 9.1 mg/dL (ref 8.4–10.5)
Calcium: 9.2 mg/dL (ref 8.4–10.5)
Creatinine, Ser: 1.36 mg/dL (ref 0.4–1.5)
GFR calc Af Amer: >60 mL/min (ref >60)
GFR calc non Af Amer: >60 mL/min (ref >60)
Glucose, Bld: 71 mg/dL (ref 70–99)
Sodium: 139 mEq/L (ref 135–145)
Total Protein: 6.2 g/dL (ref 6.0–8.3)
Total Protein: 6.7 g/dL (ref 6.0–8.3)

## 2011-03-16 LAB — GLUCOSE, CAPILLARY
Glucose-Capillary: 103 mg/dL — ABNORMAL HIGH (ref 70–99)
Glucose-Capillary: 106 mg/dL — ABNORMAL HIGH (ref 70–99)
Glucose-Capillary: 110 mg/dL — ABNORMAL HIGH (ref 70–99)
Glucose-Capillary: 110 mg/dL — ABNORMAL HIGH (ref 70–99)
Glucose-Capillary: 110 mg/dL — ABNORMAL HIGH (ref 70–99)
Glucose-Capillary: 111 mg/dL — ABNORMAL HIGH (ref 70–99)
Glucose-Capillary: 114 mg/dL — ABNORMAL HIGH (ref 70–99)
Glucose-Capillary: 115 mg/dL — ABNORMAL HIGH (ref 70–99)
Glucose-Capillary: 116 mg/dL — ABNORMAL HIGH (ref 70–99)
Glucose-Capillary: 118 mg/dL — ABNORMAL HIGH (ref 70–99)
Glucose-Capillary: 118 mg/dL — ABNORMAL HIGH (ref 70–99)
Glucose-Capillary: 120 mg/dL — ABNORMAL HIGH (ref 70–99)
Glucose-Capillary: 121 mg/dL — ABNORMAL HIGH (ref 70–99)
Glucose-Capillary: 122 mg/dL — ABNORMAL HIGH (ref 70–99)
Glucose-Capillary: 125 mg/dL — ABNORMAL HIGH (ref 70–99)
Glucose-Capillary: 128 mg/dL — ABNORMAL HIGH (ref 70–99)
Glucose-Capillary: 147 mg/dL — ABNORMAL HIGH (ref 70–99)
Glucose-Capillary: 167 mg/dL — ABNORMAL HIGH (ref 70–99)
Glucose-Capillary: 180 mg/dL — ABNORMAL HIGH (ref 70–99)
Glucose-Capillary: 94 mg/dL (ref 70–99)
Glucose-Capillary: 97 mg/dL (ref 70–99)
Glucose-Capillary: 97 mg/dL (ref 70–99)

## 2011-03-16 LAB — POCT I-STAT 3, ART BLOOD GAS (G3+)
Acid-Base Excess: 1 mmol/L (ref 0.0–2.0)
Acid-Base Excess: 4 mmol/L — ABNORMAL HIGH (ref 0.0–2.0)
Acid-base deficit: 1 mmol/L (ref 0.0–2.0)
Bicarbonate: 24.8 meq/L — ABNORMAL HIGH (ref 20.0–24.0)
Bicarbonate: 25.2 mEq/L — ABNORMAL HIGH (ref 20.0–24.0)
Bicarbonate: 26.9 mEq/L — ABNORMAL HIGH (ref 20.0–24.0)
Bicarbonate: 28.2 mEq/L — ABNORMAL HIGH (ref 20.0–24.0)
Bicarbonate: 29.4 meq/L — ABNORMAL HIGH (ref 20.0–24.0)
O2 Saturation: 100 %
O2 Saturation: 100 %
O2 Saturation: 100 %
O2 Saturation: 100 %
O2 Saturation: 99 %
Patient temperature: 35.1
Patient temperature: 37.3
TCO2: 26 mmol/L (ref 0–100)
TCO2: 26 mmol/L (ref 0–100)
TCO2: 28 mmol/L (ref 0–100)
TCO2: 30 mmol/L (ref 0–100)
TCO2: 31 mmol/L (ref 0–100)
pCO2 arterial: 40.6 mmHg (ref 35.0–45.0)
pCO2 arterial: 41.4 mmHg (ref 35.0–45.0)
pCO2 arterial: 43.2 mmHg (ref 35.0–45.0)
pCO2 arterial: 45.5 mmHg — ABNORMAL HIGH (ref 35.0–45.0)
pCO2 arterial: 60.6 mmHg (ref 35.0–45.0)
pH, Arterial: 7.276 — ABNORMAL LOW (ref 7.350–7.450)
pH, Arterial: 7.367 (ref 7.350–7.450)
pH, Arterial: 7.401 (ref 7.350–7.450)
pH, Arterial: 7.412 (ref 7.350–7.450)
pH, Arterial: 7.418 (ref 7.350–7.450)
pO2, Arterial: 128 mmHg — ABNORMAL HIGH (ref 80.0–100.0)
pO2, Arterial: 188 mmHg — ABNORMAL HIGH (ref 80.0–100.0)
pO2, Arterial: 265 mmHg — ABNORMAL HIGH (ref 80.0–100.0)
pO2, Arterial: 288 mmHg — ABNORMAL HIGH (ref 80.0–100.0)
pO2, Arterial: 532 mmHg — ABNORMAL HIGH (ref 80.0–100.0)

## 2011-03-16 LAB — CK TOTAL AND CKMB (NOT AT ARMC)
CK, MB: 1.3 ng/mL (ref 0.3–4.0)
CK, MB: 1.5 ng/mL (ref 0.3–4.0)
CK, MB: 1.7 ng/mL (ref 0.3–4.0)
CK, MB: 5.6 ng/mL — ABNORMAL HIGH (ref 0.3–4.0)
Relative Index: 2.2 (ref 0.0–2.5)
Relative Index: INVALID (ref 0.0–2.5)
Relative Index: INVALID (ref 0.0–2.5)
Relative Index: INVALID (ref 0.0–2.5)
Total CK: 256 U/L — ABNORMAL HIGH (ref 7–232)
Total CK: 70 U/L (ref 7–232)
Total CK: 77 U/L (ref 7–232)

## 2011-03-16 LAB — TYPE AND SCREEN
ABO/RH(D): O POS
Antibody Screen: NEGATIVE

## 2011-03-16 LAB — POCT I-STAT, CHEM 8
BUN: 14 mg/dL (ref 6–23)
BUN: 15 mg/dL (ref 6–23)
Calcium, Ion: 1.13 mmol/L (ref 1.12–1.32)
Calcium, Ion: 1.15 mmol/L (ref 1.12–1.32)
Chloride: 103 mEq/L (ref 96–112)
Chloride: 98 mEq/L (ref 96–112)
Creatinine, Ser: 1.2 mg/dL (ref 0.4–1.5)
Creatinine, Ser: 1.2 mg/dL (ref 0.4–1.5)
Glucose, Bld: 106 mg/dL — ABNORMAL HIGH (ref 70–99)
Glucose, Bld: 112 mg/dL — ABNORMAL HIGH (ref 70–99)
HCT: 26 % — ABNORMAL LOW (ref 39.0–52.0)
HCT: 31 % — ABNORMAL LOW (ref 39.0–52.0)
Hemoglobin: 10.5 g/dL — ABNORMAL LOW (ref 13.0–17.0)
Hemoglobin: 8.8 g/dL — ABNORMAL LOW (ref 13.0–17.0)
Potassium: 4.4 mEq/L (ref 3.5–5.1)
Potassium: 4.4 mEq/L (ref 3.5–5.1)
Sodium: 133 mEq/L — ABNORMAL LOW (ref 135–145)
Sodium: 136 mEq/L (ref 135–145)
TCO2: 24 mmol/L (ref 0–100)
TCO2: 25 mmol/L (ref 0–100)

## 2011-03-16 LAB — BASIC METABOLIC PANEL
BUN: 11 mg/dL (ref 6–23)
BUN: 12 mg/dL (ref 6–23)
BUN: 12 mg/dL (ref 6–23)
BUN: 14 mg/dL (ref 6–23)
CO2: 25 mEq/L (ref 19–32)
CO2: 27 mEq/L (ref 19–32)
CO2: 29 mEq/L (ref 19–32)
Calcium: 7.4 mg/dL — ABNORMAL LOW (ref 8.4–10.5)
Calcium: 7.8 mg/dL — ABNORMAL LOW (ref 8.4–10.5)
Calcium: 8.2 mg/dL — ABNORMAL LOW (ref 8.4–10.5)
Chloride: 100 mEq/L (ref 96–112)
Chloride: 103 mEq/L (ref 96–112)
Chloride: 107 mEq/L (ref 96–112)
Chloride: 99 mEq/L (ref 96–112)
Creatinine, Ser: 1.12 mg/dL (ref 0.4–1.5)
Creatinine, Ser: 1.13 mg/dL (ref 0.4–1.5)
Creatinine, Ser: 1.19 mg/dL (ref 0.4–1.5)
GFR calc Af Amer: >60 mL/min (ref >60)
GFR calc Af Amer: >60 mL/min (ref >60)
GFR calc Af Amer: >60 mL/min (ref >60)
GFR calc non Af Amer: >60 mL/min (ref >60)
GFR calc non Af Amer: >60 mL/min (ref >60)
GFR calc non Af Amer: >60 mL/min (ref >60)
GFR calc non Af Amer: >60 mL/min (ref >60)
Glucose, Bld: 104 mg/dL — ABNORMAL HIGH (ref 70–99)
Glucose, Bld: 106 mg/dL — ABNORMAL HIGH (ref 70–99)
Glucose, Bld: 118 mg/dL — ABNORMAL HIGH (ref 70–99)
Glucose, Bld: 97 mg/dL (ref 70–99)
Potassium: 3.7 mEq/L (ref 3.5–5.1)
Potassium: 4 mEq/L (ref 3.5–5.1)
Potassium: 4 mEq/L (ref 3.5–5.1)
Potassium: 4.2 mEq/L (ref 3.5–5.1)
Sodium: 130 mEq/L — ABNORMAL LOW (ref 135–145)
Sodium: 133 mEq/L — ABNORMAL LOW (ref 135–145)
Sodium: 136 mEq/L (ref 135–145)
Sodium: 138 mEq/L (ref 135–145)

## 2011-03-16 LAB — POCT I-STAT 3, VENOUS BLOOD GAS (G3P V)
Acid-base deficit: 2 mmol/L (ref 0.0–2.0)
Bicarbonate: 24.4 meq/L — ABNORMAL HIGH (ref 20.0–24.0)
O2 Saturation: 77 %
TCO2: 26 mmol/L (ref 0–100)
pCO2, Ven: 47.9 mmHg (ref 45.0–50.0)
pH, Ven: 7.316 — ABNORMAL HIGH (ref 7.250–7.300)
pO2, Ven: 46 mmHg — ABNORMAL HIGH (ref 30.0–45.0)

## 2011-03-16 LAB — DIFFERENTIAL
Basophils Relative: 0 % (ref 0–1)
Eosinophils Absolute: 0.4 10*3/uL (ref 0.0–0.7)
Eosinophils Relative: 4 % (ref 0–5)
Lymphocytes Relative: 42 % (ref 12–46)
Lymphs Abs: 3.2 10*3/uL (ref 0.7–4.0)
Lymphs Abs: 4 10*3/uL (ref 0.7–4.0)
Monocytes Absolute: 0.7 10*3/uL (ref 0.1–1.0)
Monocytes Relative: 7 % (ref 3–12)
Monocytes Relative: 9 % (ref 3–12)
Neutro Abs: 4.4 10*3/uL (ref 1.7–7.7)
Neutrophils Relative %: 46 % (ref 43–77)

## 2011-03-16 LAB — LIPID PANEL
Cholesterol: 230 mg/dL — ABNORMAL HIGH (ref 0–200)
LDL Cholesterol: 163 mg/dL — ABNORMAL HIGH (ref 0–99)
Triglycerides: 218 mg/dL — ABNORMAL HIGH (ref <150)
VLDL: 44 mg/dL — ABNORMAL HIGH (ref 0–40)

## 2011-03-16 LAB — APTT
aPTT: 31 seconds (ref 24–37)
aPTT: 39 seconds — ABNORMAL HIGH (ref 24–37)

## 2011-03-16 LAB — CREATININE, SERUM
Creatinine, Ser: 1.13 mg/dL (ref 0.4–1.5)
Creatinine, Ser: 1.15 mg/dL (ref 0.4–1.5)
GFR calc Af Amer: >60 mL/min (ref >60)
GFR calc Af Amer: >60 mL/min (ref >60)
GFR calc non Af Amer: >60 mL/min (ref >60)
GFR calc non Af Amer: >60 mL/min (ref >60)

## 2011-03-16 LAB — ABO/RH: ABO/RH(D): O POS

## 2011-03-16 LAB — HEMOGLOBIN AND HEMATOCRIT, BLOOD
HCT: 22.9 % — ABNORMAL LOW (ref 39.0–52.0)
Hemoglobin: 7.9 g/dL — CL (ref 13.0–17.0)

## 2011-03-16 LAB — HEPARIN LEVEL (UNFRACTIONATED)
Heparin Unfractionated: <0.1 IU/mL — ABNORMAL LOW (ref 0.30–0.70)
Heparin Unfractionated: <0.1 IU/mL — ABNORMAL LOW (ref 0.30–0.70)

## 2011-03-16 LAB — MRSA PCR SCREENING: MRSA by PCR: NEGATIVE

## 2011-03-16 LAB — PLATELET COUNT: Platelets: 111 K/uL — ABNORMAL LOW (ref 150–400)

## 2011-03-16 LAB — HEMOGLOBIN A1C: Mean Plasma Glucose: 105 mg/dL

## 2011-03-16 LAB — TROPONIN I: Troponin I: 0.1 ng/mL — ABNORMAL HIGH (ref 0.00–0.06)

## 2011-03-17 LAB — CK TOTAL AND CKMB (NOT AT ARMC)
CK, MB: 1.2 ng/mL (ref 0.3–4.0)
Relative Index: INVALID (ref 0.0–2.5)
Total CK: 82 U/L (ref 7–232)

## 2011-03-17 LAB — DIFFERENTIAL
Basophils Absolute: 0 10*3/uL (ref 0.0–0.1)
Basophils Relative: 0 % (ref 0–1)
Eosinophils Absolute: 0.3 10*3/uL (ref 0.0–0.7)
Eosinophils Relative: 3 % (ref 0–5)
Monocytes Absolute: 0.9 10*3/uL (ref 0.1–1.0)

## 2011-03-17 LAB — POCT CARDIAC MARKERS: Myoglobin, poc: 39.5 ng/mL (ref 12–200)

## 2011-03-17 LAB — COMPREHENSIVE METABOLIC PANEL
ALT: 19 U/L (ref 0–53)
Albumin: 3.9 g/dL (ref 3.5–5.2)
Calcium: 9.4 mg/dL (ref 8.4–10.5)
GFR calc Af Amer: >60 mL/min (ref >60)
Glucose, Bld: 87 mg/dL (ref 70–99)
Sodium: 136 mEq/L (ref 135–145)
Total Protein: 7 g/dL (ref 6.0–8.3)

## 2011-03-17 LAB — CBC
HCT: 43 % (ref 39.0–52.0)
Hemoglobin: 14.8 g/dL (ref 13.0–17.0)
MCHC: 34.3 g/dL (ref 30.0–36.0)
Platelets: 256 10*3/uL (ref 150–400)
RDW: 13.8 % (ref 11.5–15.5)

## 2011-03-17 LAB — PROTIME-INR: Prothrombin Time: 13.5 seconds (ref 11.6–15.2)

## 2011-03-17 LAB — POCT I-STAT, CHEM 8
BUN: 21 mg/dL (ref 6–23)
Calcium, Ion: 1.17 mmol/L (ref 1.12–1.32)
Creatinine, Ser: 1.3 mg/dL (ref 0.4–1.5)
Glucose, Bld: 88 mg/dL (ref 70–99)
TCO2: 24 mmol/L (ref 0–100)

## 2011-03-17 LAB — HIGH SENSITIVITY CRP: CRP, High Sensitivity: 1.9 mg/L

## 2011-04-22 NOTE — Consult Note (Signed)
Dave [name], BUSKEY NO.:  [id number]   MEDICAL RECORD NO.:  [id number]          PATIENT TYPE:  INP   LOCATION:  2906                         FACILITY:  [facility]   PHYSICIAN:  Anselm Jungling, MD  DATE OF BIRTH:  12-05-1956   DATE OF CONSULTATION:  06/13/2009  DATE OF DISCHARGE:                                 CONSULTATION   IDENTIFYING DATA AND REASON FOR REFERRAL:  The patient is a 55 year old  disabled married Caucasian male referred for psychiatric consultation by  his medical treatment team here at [facility].  A psychiatric  consultation is requested to assess mental status and to make  recommendations.  The patient has been highly agitated, and making  parasuicidal statements in relation to recommendations that he stay for  coronary artery bypass graft surgery.   HISTORY OF THE PRESENTING PROBLEMS:  The patient, who is not currently  under a psychiatrist's care, nonetheless states that he is on a form of  psychiatric disability for chronic brain syndrome.  He states that  this was diagnosed several years ago after a nervous breakdown.  His  current medical [name] is Dr. Sherril Croon, a primary care physician.  He  takes no psychotropic medication now on a prescribed basis, but admits  to heavy marijuana use on a daily basis, which he states adequately  controls his agitation and restlessness.  He is somewhat vague as to his  psychiatric history otherwise, for instance, not stating clearly whether  or not he was ever hospitalized psychiatrically, or at the time he had  his nervous breakdown.   The patient apparently had an acute MI approximately 2 weeks ago,  presented for treatment at that time, and then left AMA, only to return  again due to reoccurrence of chest pain yesterday.  He has subsequently  been evaluated and has had cardiac catheterization which has resulted in  a recommendation of coronary artery bypass graft surgery.  When this was  presented to the patient today, he again began to talk about leaving  against medical advice, stating that he could not stand to stay in the  hospital.  Somewhere in the course of this, there were comments by the  patient about jumping out of the window if necessary.  There was also  some comment about taking a bunch of pills.   This led to a call for psychiatric consultation.  Between this time and  the time of my arrival to see the patient, the patient has calmed down  somewhat, and has consented to going ahead with the plan for coronary  artery bypass graft surgery, which he has been told will occur 2, 3, or  perhaps 4 days from now.   SOCIAL HISTORY:  The patient states that he has been on disability, and  unable to work since his reported nervous breakdown 7-8 years ago.  He  currently lives with his wife.  He has two grown daughters, and he has  some grandchildren by them.  He states that he was born in [place], [place] area, but has  lived in [place] for  most of his adult life.  He states that he has been using marijuana  since he was a young teenager.  He indicates that circumstances have  been difficult for he and his wife, due to their extremely limited  finances.   He describes himself as an individual who has always been high-strung,  nervous, restless, and with a great deal of excess energy.  As  referenced above, he denies any clear history indicating any major  psychiatric disorder such as schizophrenia, bipolar disorder, or clearly  defined organic etiology.  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  His  wife was not able to clarify this any further.   MENTAL STATUS AND OBSERVATIONS:  The patient is a slender, but  adequately nourished and normally-developed adult male who I interviewed  in his cardiac care unit room.  His wife was present, and his adult  daughters and some  other family were nearby.  When I approached him, he  immediately addressed to me and told me that he thought that I might be  the psychiatrist.  I indicated that I was, and he immediately began  talking fairly rapidly about his resentment that a psychiatrist has been  called in.  He stated several times I am not suicidal.  He began to  talk immediately about his discomfort at being asked to remain at  bedrest (apparently he needs to stay at bedrest for approximately one  more hour, in the aftermath of his cardiac cath procedure).  I asked him  if he wanted to give consent for me to interview him, and I reminded him  that he was not required to speak with me.  He calmed down a bit and  then said that he was alright talking with me.  He indicated that he was  alright with his wife staying for the interview.  His daughters and  other family left the area.   I sat down with the patient and we had an extended conversation.  He  apologized for having been so irritable with me.  He stated that he had  nothing personally against me.  I reassured him that I took his  statements seriously that he is not suicidal.  I reassured him that I  understand how difficulty his current situation is for him.  With this,  he went on to describe how restless an individual he is and how  difficult it is for him to endure the possibility of having to be in a  hospital bed for several days, without being able to get up and walk  around, go out and have a cigarette.  I told him I understood all of  this completely.  We talked about his marijuana usage, and he reassured  me that he is not a drug addict and he is not seeking drugs for  pleasure.  I reassured him that I understand that some individuals do  derive significant benefit from marijuana for a variety of conditions,  including various pain syndromes, anxiety disorders, etc.  I told him  that I was not here to judge him about his marijuana usage, but to see   if I could find a way to help make his situation more tolerable.  He is  fully oriented and appears to be of above average intelligence.  His  speech is clear and articulate.  He is rational  and, once his anxiety  level drops, is really a very reasonable individual to speak with.  By  the end of our conversation, he was quite congenial, thanked me, shook  my hand, and indicated that he would be looking forward to see me again.   In my interaction with the patient, I reassured him that we would do  everything possible to keep him comfortable while he is with [place], and  that we would be able to discuss whatever medication would be necessary  for that to occur.  I told him that the endeavor here was for a life  saving medical and surgical procedure, and for now we were not going to  be concerned about issues of whether marijuana use is legal or  appropriate, etc., but merely to get him through this difficult and  stressful challenge with the best outcome possible.  He appeared to  appreciate this.   IMPRESSION:  Mr. Hornback is an unusual individual.  I would have  difficulty attaching any particular psychiatric diagnosis to him.  He  seems to be one of those individuals who is highly energetic, by nature  very tense, frequently on edge, and somewhat irritable.  However, he is  also a very pleasant and reasonable individual, and is acutely aware of  his level of attention, irritability, and actually seems to work hard to  modify and minimize his tense and irritable responses.   I believe that this is a situation that for him is extremely  challenging.  He apparently has never had any significant physical  illness before, with the exception of a cholecystectomy a few years ago.  I believe that he is an individual for whom it would very difficult to  be confined to a hospital, and to a bed.  Also, I believe he does have a  certain degree of anxiety about being separated from his regular   marijuana use.   I believe that he will be an individual that we can work well with, as  long as he is given due respect to special challenges that his  personality and temperament bring to the situation.  I believe that we  should be able to successfully keep him comfortable both before and  following surgery.   DIAGNOSTIC IMPRESSION:  Axis I:  Anxiety disorder, not otherwise  specified.  Axis II:  Personality disorder, not otherwise specified, with histrionic  traits.  Axis III:  Coronary artery disease, severe, tobacco dependence.  Axis IV:  Stressors severe.  Axis V:  Global Assessment of Functioning 75.   RECOMMENDATIONS:  At this time, I have formed a positive relationship  with the patient I believe, and I have reassured him that we will work  with medication to keep him comfortable while he is here.  For now, I  have ordered Valium 10 mg p.o. or 5 mg IV q.2 h., as needed for  agitation or sleep, not to exceed 60 mg in 24 hours.  I have also  ordered Phenergan 12.5 mg to be given with any dose of morphine sulfate  (right now he has a very bad headache due to his nitroglycerin drip).  I  think the addition of Phenergan will have a synergistic effect with his  morphine.   I think that we can look into the possibility of Marinol tablets, which,  given his dependence on cannabis, may put him at ease as well.  I will  contact the pharmacy tomorrow to see if this  is something that is  available here.   I will visit the patient daily, and will return tomorrow morning, on  June 14, 2009, to visit with the patient.  I reassured him today that I  would return tomorrow morning and we would adjust his medications as  necessary.   I do not believe that any form of supervision sitter is necessary as I  believe he is not at risk for any form of self-harm.  He seems to have a  very supportive family who was present for him.   Thank you for involving me in this patient's care.       Anselm Jungling, MD  Electronically Signed     SPB/MEDQ  D:  06/13/2009  T:  06/13/2009  Job:  [id number]

## 2011-04-22 NOTE — H&P (Signed)
NAMESWADE, SHONKA NO.:  [id number]   MEDICAL RECORD NO.:  [id number]          PATIENT TYPE:  INP   LOCATION:  2906                         FACILITY:  [facility]   PHYSICIAN:  Nanetta Batty, M.D.   DATE OF BIRTH:  1956/04/01   DATE OF ADMISSION:  06/12/2009  DATE OF DISCHARGE:                              HISTORY & PHYSICAL   CHIEF COMPLAINT:  Chest pain.   HISTORY OF PRESENT ILLNESS:  Mr. Marsico is a 55 year old male who is  followed by Dr. Doreen Beam in [place].  He had originally been seen at [facility]  ER on May 30, 2009, with chest pain but had signed out AMA.  He had  recurrent chest pain which he described as a substernal burning  associated with shortness of breath and left arm numbness.  He had  recurrent chest pain on June 12, 2009, and EMS was called.  He was taken  to [facility].  EKG initially showed ST elevation in leads V2, V3  and V4.  He was treated as a STEMI.  He was transferred by EMS to [facility]  cath lab directly.  On arrival at the cath lab, he was evaluated by Dr.  Allyson Sabal.  He was pain free.  The EKG done in the cath lab holding area  showed resolution of his ST elevation.  He was admitted to CCU for  further evaluation.   PAST MEDICAL HISTORY:  Remarkable for anxiety disorder and chronic  brain syndrome per the patient.  He denies any history of diabetes or  hypertension.  His lipids are unknown.  He had a cholecystectomy in  2007.   CURRENT MEDICATIONS:  Xanax p.r.n.   ALLERGIES:  NO KNOWN DRUG ALLERGIES.   SOCIAL HISTORY:  He is married.  He has two children.  He is not  working.  He smokes two packs a day.  He does use marijuana frequently,  he says this is for his anxiety disorder.   FAMILY HISTORY:  Unremarkable for coronary disease or diabetes.   REVIEW OF SYSTEMS:  Essentially unremarkable, he has no history of CVA  or strokes.  There is no history of GI bleeding or peptic ulcer disease.   PHYSICAL EXAMINATION:  VITAL SIGNS:   On admission, blood pressure  140/72, pulse 78, respirations 12.  GENERAL:  He is a well-developed, thin, anxious male in no acute  distress.  HEENT:  Normocephalic, atraumatic.  Extraocular movements are intact.  Sclerae nonicteric.  NECK:  Reveals no JVD with bilateral carotid bruits.  CHEST:  Clear to auscultation and percussion.  CARDIAC:  Reveals a regular rate and rhythm without obvious murmur, rub  or gallop.  Normal S1-S2.  ABDOMEN:  Nontender, nondistended.  No bruits.  EXTREMITIES:  Reveal no edema.  Distal pulses are 2+/4 bilaterally.  NEURO:  Grossly intact.  He is awake, alert, oriented, cooperative.  He  is asking for something for his nerves.   IMPRESSION:  1. ST elevation myocardial infarction, aborted.  2. Heavy smoking.  3. Anxiety disorder.  4. History of chronic brain syndrome of unclear etiology per  the      patient.   PLAN:  The patient will be admitted to CCU.  He will be put on heparin,  nitrates, beta-blocker, statin, aspirin and PPI.  He will need  diagnostic catheterization.      Abelino Derrick, P.A.      Nanetta Batty, M.D.  Electronically Signed    LKK/MEDQ  D:  06/13/2009  T:  06/13/2009  Job:  [id number]   cc:   Nanetta Batty, M.D.

## 2011-04-22 NOTE — Consult Note (Signed)
Dave [name], COPEN NO.:  [id number]   MEDICAL RECORD NO.:  [id number]          PATIENT TYPE:  INP   LOCATION:  2906                         FACILITY:  [facility]   PHYSICIAN:  Sheliah Plane, MD    DATE OF BIRTH:  [birth year]   DATE OF CONSULTATION:  DATE OF DISCHARGE:                                 CONSULTATION   REQUESTING PHYSICIAN:  Nicki Guadalajara, MD   PRIMARY CARE PHYSICIAN:  Doreen Beam, MD   REASON FOR CONSULTATION:  He is on aborted STEMI with multivessel  coronary artery disease.   HISTORY OF PRESENT ILLNESS:  The patient is a 55 year old male who  before 2 weeks ago had no previous history of cardiac disease.  He came  to the [facility] Emergency Room on May 30, 2009, with classic  substernal chest pain radiating to his left arm and neck and was  classified as a STEMI.  He was seen in the emergency room and ultimately  he signed out AMA.  Last night, he again had recurrent chest pain  radiating into his neck and left arm.  He went to [facility]  where he was initially treated and transferred to [facility] under Dr.  Nanetta Batty and ultimately underwent cardiac catheterization today.  His peak troponin was 0.14, total CK-MB was 80/1.7.  He is currently  today pain free.  Previous cardiac history is essentially negative other  than the episode 2 weeks ago when he presented to the emergency room and  then signed out AMA.  He does have a history of hypertension, newly  diagnosed, not treated; history of hyperlipidemia, also untreated,  positive family history for cardiac disease in his siblings.  He has had  no previous stroke.  No claudication.  No renal insufficiency.   PAST MEDICAL HISTORY:  Although, it is poorly documented in the  patient's chart.  He says that he has organic brain syndrome and  anxiety disorder and has social security disability for this.  The  details of this are unknown.   PAST SURGICAL HISTORY:  Include lap  cholecystectomy 1-2 years ago in  [place] in 1998.  He had history of pneumonia with empyema on the left with  multiple chest tubes placed.   SOCIAL HISTORY:  The patient is married, has chronic disability for  chronic brain syndrome.  Denies alcohol use.  Notes that he smokes up  to 20 marijuana cigarettes a day and 2 packs of tobacco cigarettes a  day.   He was on no medication at the time of admission.   Denies any allergies.  Positive history for chest pain.  He denies  shortness of breath or exertional shortness of breath.  Denies  palpitations, syncope, presyncope, or orthopnea.  He specifically had no  TIA.  Does say occasional cough.  No hemoptysis.  Psychiatric history,  he notes that he is very anxious.  He has seen Psychiatry in the past,  but does not remember who was and has not seen them recently.   PHYSICAL EXAMINATION:  The patient is very agitated.  He has  told nurses  on several occasions his plan to jump out the window and also notes that  he has medications that he will take once he leaves.  He is aware of  where he is and answers questions appropriately, but has very forced  rapid speech.  His blood pressure is 150/88, pulse is 63, and O2 sats  97%.  He is 69 inches' tall, 75 pounds.  Pupils are equal, round, and  reactive to light.  He has loud bilateral carotid bruits.  He has no  cardiac murmur.  Abdominal exam is benign without palpable masses.  He  has 2+ DP and PT pulses bilaterally, appears to have adequate vein for  bypass.   LABORATORY FINDINGS:  White count is 9.7, hematocrit is 41, and platelet  count 232.  Cholesterol is 230, triglycerides 213, HDL 23, and LDL 163.  Creatinine 1.1.  Cardiac catheterization films were reviewed.  He has a  25% ejection fraction with significant anterior hypokinesis.  He has  really very short left main with proximal disease in his LAD and  diagonal of greater than 80% and mid LAD disease and a  totally occluded  right  coronary artery.   IMPRESSION:  The patient with multivessel coronary artery disease with  unstable anginal symptoms.  Currently, the patient is very agitated with  talking of signed himself out of the hospital in spite of warnings  against the medical advisability of this.  I have discussed with him  cardiac surgery, but with his agitated state, forced speech, and  expressing to the nursing staff his suicidal ideation.  At this point,  it would be difficult to obtain any informed consent from him.  I have  contacted the Psychiatry Service to review his situation and help in his  management and hopefully to avoid again sign out AMA and then returning  with additional myocardial infarction.      Sheliah Plane, MD  Electronically Signed     EG/MEDQ  D:  06/13/2009  T:  06/14/2009  Job:  [id number]   cc:   Nicki Guadalajara, M.D.  Doreen Beam, MD

## 2011-04-22 NOTE — Cardiovascular Report (Signed)
Dave [name], Dave [name] NO.:  [id number]   MEDICAL RECORD NO.:  [id number]          PATIENT TYPE:  INP   LOCATION:  2906                         FACILITY:  [facility]   PHYSICIAN:  Nicki Guadalajara, M.D.     DATE OF BIRTH:  [birth year]   DATE OF PROCEDURE:  06/12/2009  DATE OF DISCHARGE:                            CARDIAC CATHETERIZATION   INDICATIONS:  [name] is a 55 year old gentleman who has  a history of longstanding tobacco use, as well as development of  recurrent episodes of chest pain.  He has history of marijuana use.  Apparently several weeks ago, he presented to an emergency room with  chest pain and left against medical advice.  Yesterday, he represented  to [facility] with chest pain and his ECG suggested anterior ST-  segment elevation.  Code STEMI protocol was initiated.  The patient was  treated with IV nitroglycerin, heparin, beta-blocker therapy with  resolution of pain in ECG changes.  He was seen upon arrival to [facility] by Dr. Allyson Sabal.  At that time, the patient was pain free.  The decision was made to continue the patient on nitroglycerin, heparin,  and he is now scheduled for cardiac catheterization.   PROCEDURE:  After premedication with Versed 2 mg intravenously plus  fentanyl 100 mcg, the patient was prepped and draped in usual fashion.  His right femoral artery was punctured anteriorly and a 5-French sheath  was inserted.  Diagnostic cardiac catheterization was done utilizing 5-  [place] FL-4 catheters.  A 5-French FR-4 catheter as well as a no-torque  right catheter were used for angiography of the right coronary system.  The right catheter was used for selective angiography into the left  subclavian, internal mammary artery system.  A 5-French pigtail catheter  was used for biplane cineventriculography.  Distal aortography was also  performed.  The patient tolerated the procedure well.  He returned to  his room in  satisfactory condition.   HEMODYNAMIC DATA:  Central aortic pressure was 165/93.  Left ventricle  pressure 165/24.   ANGIOGRAPHIC DATA:  The LAD arose several millimeters superior from the  separate ostium giving rise to the circumflex vessel.  The  LAD as it  arose from the aorta, had diffuse narrowing of at least 70%.  This was  associated with catheter dampening with engagement into the LAD system.  Following this, diffuse 70% ostial proximal stenosis.  The LAD gave rise  to proximal septal perforating artery and first diagonal vessel.  There  was a diffuse 60% narrowing in the LAD just after the diagonal takeoff.  This was then followed by a bifurcation stenosis involving a moderate-  sized septal perforating artery and LAD system.  There was 95% narrowing  in the ostium of the septal perforating artery and 70-80% narrowing in  the LAD at this bifurcation site.  The LAD did extend and then wrapped  around the LV apex.   Circumflex vessel again arose from a separate ostium inferior to the  takeoff of the LAD system.  On most views, there did not  appear to be  ostial narrowing and on several views, there was a suggestion of at  least 40% narrowing of the ostium, but the possibility was that this  also could be related to catheter dampening.  The circumflex vessel was  a dominant system that gave rise to a large OM1 and OM2 vessel.  Collateralization to the distal RCA was seen both via LAD and circumflex  collaterals.   The LV right coronary was totally occluded at its origin.  There was  visualization of the conus branch and very faint visualization of the  very proximal RCA.  Again, the RCA was supplied predominately by left-to-  right collaterals.   The left subclavian system had evidence for calcification and plaque.  There appeared to be a least an 80% ostial proximal vertebral artery  stenosis.  The LIMA itself was widely patent and suitable for CABG  surgery.   Biplane  cineventriculography revealed a dilated left ventricle with  severe LV dysfunction.  Ejection fraction was approximately 25%.  There  was a severe hypo-to-akinesis of the inferior wall and hypocontractility  of the mid distal anterolateral wall on the RAO projection.  On the LAO  projection, there was moderate global hypocontractility with more  significant inferior, inferolateral hypocontractility.   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  There was mild diffuse  narrowing in the iliac systems of approximately 30-40%.   IMPRESSION:  1. Severe global left ventricular dysfunction with an ejection      fraction of approximately 25% with significant hypocontractility to      akinesis involving an inferior wall hypocontractility      anterolaterally and significant hypocontractility in the      inferolateral wall.  2. Significant multivessel coronary obstructive disease with separate      ostium giving rise to the left anterior descending and left      circumflex vessels with diffuse 70% ostial proximal left anterior      descending stenosis, 60% left anterior descending stenosis after      the takeoff of the first septal and diagonal vessel and bifurcation      95% and 80% septal mid left anterior descending stenosis.  3. Large circumflex system with questionable ostial catheter spasm.  4. Total occlusion of the right coronary artery with extensive left-to-      right collaterals.  5. An 80% left vertebral artery narrowing arising from the left      circumflex system with evidence for plaque in the proximal      subclavian system with a widely patent unbypassed left internal      mammary artery.  6. 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   RECOMMENDATIONS:  The patient will be evaluated for consideration of  CABG revascularization  surgery.           ______________________________  Nicki Guadalajara, M.D.     TK/MEDQ  D:  06/13/2009  T:  06/14/2009  Job:  [id number]   cc:   Nanetta Batty, M.D.  Doreen Beam, MD

## 2011-04-22 NOTE — Op Note (Signed)
Dave [name], Dave [name] NO.:  [id number]   MEDICAL RECORD NO.:  [id number]          PATIENT TYPE:  INP   LOCATION:  2015                         FACILITY:  [facility]   PHYSICIAN:  Sheliah Plane, MD    DATE OF BIRTH:  [birth year]   DATE OF PROCEDURE:  06/15/2009  DATE OF DISCHARGE:                               OPERATIVE REPORT   PREOPERATIVE DIAGNOSES:  Recent acute myocardial infarction and coronary  occlusive disease.   POSTOPERATIVE DIAGNOSES:  Recent acute myocardial infarction and  coronary occlusive disease.   SURGICAL PROCEDURE:  Coronary artery bypass grafting x3 with left  internal mammary to left anterior descending coronary artery, reverse  saphenous vein graft to the diagonal coronary artery, and reverse  saphenous vein graft to posterior descending coronary artery with right  thigh endovein harvesting.   SURGEON:  Sheliah Plane, MD   FIRST ASSISTANT:  Doree Fudge, PA   BRIEF HISTORY:  The patient is a 55 year old male, who several weeks ago  presented to the [facility] Emergency Room with substernal chest pain.  Ultimately, left the emergency room, although this had not been  recommended to him.  He again returned because of persistent prolonged  chest pain, was found to have elevated CK and troponins, was admitted,  and underwent cardiac catheterization by Dr. Daphene Jaeger, which showed  high-grade complex proximal LAD diagonal lesion and total occlusion of  the right coronary artery circumflex.  Actually, she came off with  separate ostium without significant disease.  Initially, the patient was  very reluctant to have bypass surgery and became very agitated, required  acute psychiatry consultation.  He has previously been have a user of  marijuana and heavy smoker, up to 2 packs a day.  Ultimately, he agreed  to proceed with coronary artery bypass grafting, his left ventricular  function was markedly pressed at the time of catheterization,  estimated  at about 25%.  The risks and options were discussed with the patient in  detail and he was willing to proceed.   DESCRIPTION OF PROCEDURE:  With Swan-Ganz and arterial line monitors in  place, the patient under general endotracheal anesthesia without  incident.  Transesophageal echo probe was placed, results dictated under  separate note.  Skin of chest and legs were prepped with Betadine and  draped in usual sterile manner.  Using the [employer] endovein harvest  system, vein was harvested from the right thigh and it was adequate  quality and caliber.  Median sternotomy was performed, left internal  mammary artery was dissected down to his pedicle graft.  Distal artery  was divided and hydrostatically dilated with heparinized saline.  Pericardium was opened.  The patient did have evidence of global  hypokinesis.  The ventricle did not appear to have full-thickness scar.  He was systemically heparinized the ascending aorta.  The right atrium  was cannulated and aortic root vent cardioplegia needle was introduced  into the ascending aorta.  The patient was placed on cardiopulmonary  bypass 2.4 L per minute per meter square.  Sites of anastomosis were  selected and dissected out  of the epicardium.  The patient's body  temperature cooled to 30 degrees.  Aortic crossclamp was applied and 500  mL of cold blood potassium cardioplegia was administered antegrade.  Attention was turned first to the distal right coronary artery, which  was totally occluded angiographically with very faint filling by his  posterior descending branch was identified and was larger than expected,  admitted a 1.5-mm probe, using a running 7-0 Prolene, distal anastomosis  was performed.  Attention was then turned to the diagonal coronary  artery, which was small vessel 1.2 mm size, using running 7-0 Prolene,  distal anastomosis was performed with second reverse saphenous vein  graft.  The left internal mammary  artery was then anastomosed and mid  left anterior descending coronary artery with a running 8-0 Prolene.  Additional cold blood cardioplegia was administered intermittently down  the vein grafts with crossclamp still in place, 2 punch of aortotomies  were performed.  Each of the two vein grafts were anastomosed to the  ascending aorta.  Air was evacuated from grafts in the ascending aorta  and aortic crossclamp was removed.  Total crossclamp time was 64  minutes.  The patient spontaneously converted to a sinus rhythm.  Sites  of anastomosis were inspected free of bleeding.  The patient was then  ventilated and weaned cardiopulmonary bypass without difficulty.  Atrial  ventricular pacing wires were applied.  Graft markers applied.  Left  pleural tube and Blake mediastinal drain left in place.  Sternum was  closed with #6 stainless steel wire fashion.  Pericardium was  reapproximated.  Sternum was then closed with #6 stainless steel wire.  Fascia was closed with interrupted 0 Vicryl, running 3-0 Vicryl  subcutaneous tissue or subcuticular stitch in the skin edges.  Dry  dressings were applied.  Sponge and needle count was reported as correct  at completion of the procedure.  The patient tolerated the procedure  without obvious complication and was transferred to Surgical Intensive  Care Unit for further postoperative care.       Sheliah Plane, MD  Electronically Signed     EG/MEDQ  D:  06/17/2009  T:  06/18/2009  Job:  [id number]   cc:   Nicki Guadalajara, M.D.

## 2011-04-22 NOTE — Op Note (Signed)
[name], [name] NO.:  [id number]   MEDICAL RECORD NO.:  [id number]          PATIENT TYPE:  INP   LOCATION:  2302                         FACILITY:  [facility]   PHYSICIAN:  Guadalupe Maple, M.D.  DATE OF BIRTH:  1956-07-24   DATE OF PROCEDURE:  06/15/2009  DATE OF DISCHARGE:                               OPERATIVE REPORT   PROCEDURE:  Intraoperative transesophageal echocardiography.   [name] is a 55 year old white male with a history of  multivessel coronary artery disease and unstable anginal symptoms, who  was scheduled to undergo coronary artery bypass grafting by Dr.  Tyrone Sage.  With his cardiac catheterization, he was noted to have  ejection fraction of 25% and intraoperative transesophageal  echocardiography was requested to evaluate the left ventricular function  to determine if any other valvular pathology was present and to serve as  a monitor for intraoperative volume status.   The patient was brought to the operating room at [facility] and  general anesthesia was induced without difficulty.  The trachea was  intubated without difficulty.   IMPRESSION:  Prebypass findings:  1. Aortic valve:  The aortic valve was trileaflet and opened normally.      The leaflets were thin and pliable.  There was no aortic      insufficiency.  2. Mitral valve:  The mitral leaflets opened well and coapted      normally.  There was no mitral insufficiency noted.  3. Left ventricle:  The left ventricular cavity appeared somewhat      dilated.  Left ventricular end diastolic diameter measured 6.5 cm      of the mid papillary level.  Left ventricular wall thickness was      normal and measured 1.0-1.1 cm at end diastole at the mid papillary      level concentrically.  There was significant hypokinesis noted of      the distal anterior wall and anterior septum, but the apex in the      anterior septum.  Ejection fraction overall was estimated at 40-    45%.  There was no thrombus noted in the left ventricular apex.  At      the mid papillary level in the short axis view, there was good      contractility in all segments.  4. Right ventricle:  The right ventricular size was normal.  There was      good contractility of the right ventricular free wall and normal-      appearing right ventricular function.  5. Tricuspid valve:  The tricuspid valve was structurally intact.      There was trace tricuspid insufficiency.  6. Interatrial septum:  The interatrial septum was intact without      evidence of patent foramen ovale or atrioseptal defect by color      Doppler or bubble study.  7. Left atrium:  There was no thrombus noted in the left atrium or      left atrial appendage.  8. Ascending aorta:  The ascending aorta showed a well-defined aortic  root inside the tubular ridge with no significant atheromatous      disease noted.  9. Descending aorta:  The descending aorta showed moderate      atheromatous changes and measured 2.6 cm in diameter.   POST-BYPASS FINDINGS:  1. Aortic valve:  The aortic valve was unchanged from the prebypass      study.  There was no aortic insufficiency and normal opening of the      aortic valve.  2. Mitral valve:  The mitral valve again was unchanged from the      prebypass study.  There was no mitral insufficiency and the valve      appeared normal.  3. Left ventricle:  The left ventricular cavity again showed      hypokinesis of the anterior wall and anterior septum and ejection      fraction was again estimated at 40-45%.  4. Right ventricle:  The right ventricular function appeared normal      with good contractility of the right ventricular      free wall and normal right ventricular size.  5. Tricuspid valve:  The tricuspid valve was unchanged from the      prebypass study with trace tricuspid insufficiency and normal-      appearing valve.           ______________________________  Guadalupe Maple, M.D.     DCJ/MEDQ  D:  06/15/2009  T:  06/16/2009  Job:  [id number]

## 2011-04-22 NOTE — Assessment & Plan Note (Signed)
OFFICE VISIT   Dave [name], Dave [name]  DOB:  20-Sep-1956                                        July 18, 2009  CHART #:  [id number]   The patient returns to the office now following his presentation with  acute anterior myocardial infarction and subsequent coronary artery  bypass grafting x3 with left internal mammary to the LAD, reverse  saphenous vein graft to the diagonal, reverse saphenous vein graft to  the posterior descending done on June 15, 2009.  The patient has made  very good progress postoperatively.  He has had no recurrent angina or  evidence of congestive heart failure.  He is increasing his activities  appropriately.  The only downside to his recovery is unfortunately he  continues to smoke, though he notes that he continues to make efforts to  stop and has dramatically cut down on his degree of smoking.   On exam, his blood pressure 108/64, pulse is 84, respiratory rate is 18,  O2 sats are 98%.  His sternum is stable and well healed.  Lungs are  clear bilaterally.  His vein harvest site is healing well.  He has no  pedal edema.   Followup chest x-ray shows clear lung fields bilaterally.   He continues on aspirin 325 mg a day, Coreg 12.5 mg twice a day, and  simvastatin 40 mg a day.  He had initially been discharged home on  lisinopril because of his acute myocardial infarction and depressed LV  function.  However, his blood pressure was running too low and this was  discontinued by Dr. Allyson Sabal when he saw him in the office several days  ago.   Overall, I am very pleased with the patient's progress.  He wanted to  discuss his chronic medications for his anxiety disorder.  He was seen  by Dr. Geralyn Flash while in the hospital for his psychiatric issues  and will call back to make a followup appointment with him for his  chronic psychiatric meds.   I have not made the patient a followup appointment to see me but would  be glad to see him at  his or Dr. Sherril Croon' or Dr. Hazle Coca request at  anytime.   Sheliah Plane, MD  Electronically Signed   EG/MEDQ  D:  07/18/2009  T:  07/19/2009  Job:  [id number]   cc:   Nanetta Batty, M.D.  Doreen Beam, MD  Anselm Jungling, MD

## 2011-04-22 NOTE — Discharge Summary (Signed)
NAMEROGERS, DITTER NO.:  [id number]   MEDICAL RECORD NO.:  [id number]          PATIENT TYPE:  INP   LOCATION:  2015                         FACILITY:  [facility]   PHYSICIAN:  Sheliah Plane, MD    DATE OF BIRTH:  06-Jul-1956   DATE OF ADMISSION:  06/12/2009  DATE OF DISCHARGE:  06/19/2009                               DISCHARGE SUMMARY   HISTORY:  The patient is a 55 year old male who is followed by Dr. Sherril Croon  in [place], [place].  He was recently seen in the [facility] Emergency  Room on May 30, 2009, with chest pain; however, he signed out against  medical advice.  He has recurrent chest pain, which he described as  substernal burning associated with shortness of breath and left arm  numbness.  He had this on June 12, 2009, and EMS was called.  He was  taken to [facility] and initial EKG revealed ST elevations in  leads V2, V3, and V4.  He was treated as a STEMI.  He was transferred by  EMS to [facility] catheterization lab directly.  He was evaluated by Dr.  Allyson Sabal.  EKG in the cath holding area showed resolution of his ST  elevation.  He was admitted to the CCU for further evaluation.   PAST MEDICAL HISTORY:  1. Anxiety disorder.  2. Chronic brain syndrome per the patient.  3. He denies history of diabetes or hypertension.  His lipids status      is unknown.   PREVIOUS SURGERIES:  Cholecystectomy in 2007.   MEDICATIONS PRIOR TO ADMISSION:  Xanax p.r.n.   ALLERGIES:  No known drug allergies.   SOCIAL HISTORY:  He is married with 2 children.  He is unemployed.  He  smokes 2 packs of cigarettes a day.  He uses marijuana frequently.  He  says he does this for his anxiety disorder.   FAMILY HISTORY:  Unremarkable.   REVIEW OF SYSTEMS:  See the history and physical.   PHYSICAL EXAMINATION:  Please see the history and physical.   HOSPITAL COURSE:  The patient was taken to the cardiac catheterization  lab.  Findings included significant multivessel coronary  artery disease  with impaired left ventricular function with an ejection fraction  approximately 25%, with significant hypercontractility to hypokinesis  involving the inferior wall and hypocontractility in the anterolateral  and inferolateral walls.  Surgical consultation was recommended and  obtained with Dr. Sheliah Plane, MD, who evaluated the patient and  studies and agreed with the recommendations to proceed with surgical  revascularization.  He was noted at time of consultation to be agitated  and was talking of signing himself out of the hospital despite warnings  of the medical inadvisability of this.  Psychiatric consultation was  felt to be indicated and was obtained.  Orders were made for Valium,  Phenergan, and additionally Marinol.  The patient was stabilized from a  medical and psychiatric view point.  Pulmonary function studies were  obtained preoperatively, which revealed moderate obstructive airway  disease.  He was deemed to be an acceptable candidate for proceeding  with the surgery.  Preoperative carotid Duplex revealed bilateral 60% to  79% ICA stenosis left greater than right.  This was felt to be a  nonsurgical situation, but would require further evaluation in the  future.  The patient was scheduled and then underwent surgery on June 15, 2009.   PROCEDURE:  Coronary artery bypass grafting x3.  The following grafts  were placed;  1. Left internal mammary artery to the LAD.  2. Saphenous vein graft to the diagonal.  3. Saphenous vein graft to the right coronary artery.   The patient was taken to the Surgical Intensive Care Unit in stable  condition.   POSTOPERATIVE HOSPITAL COURSE:  The patient has progressed nicely  overtime.  He was weaned from the ventilator without significant  difficulty.  All routines lines, monitors, drains, and devices were  discontinued in the standard fashion.  His laboratory values have been  stable.  He does have moderate acute  blood loss anemia.  Most recent  values on June 18, 2009, revealed hemoglobin 9.4 and hematocrit 27.0.  Electrolytes, BUN and creatinine are within normal limits.  He has a  moderate volume overload, but he is tolerating diuresis.  His incisions  were shown to be healing well without evidence of infection.  Of note,  the patient did have an 2D echocardiogram revealing ejection fraction to  be 50-55% as compared to the findings during catheterization of 25%.  The patient on June 19, 2009, was found to have a low-grade fever.  T-  max was 100.2.  His pacer wires were discontinued on that day.  It was  discussed with the patient that we will recheck him later in the  afternoon to make a decision on discharge on June 19, 2009.  However, he  was not willing to wait and signed out against medical advise on June 19, 2009, at 11:50.   MEDICATIONS ON DISCHARGE:  1. Aspirin 325 mg daily.  2. Zocor 40 mg daily.  3. Nicotine patch p.r.n.  4. Lisinopril 5 mg daily.  5. Coreg 12.5 mg twice daily.  6. Oxycodone 5 mg 1-2 every 4-6 hours as needed for pain.  7. Marinol 5-10 mg q.4 h. p.r.n.   We have discussed smoking cessation with him including marijuana.   CONDITION ON DISCHARGE:  The patient did appear stable although final  decision and evaluation was not made as the patient did sign out against  medical advise.   FINAL DIAGNOSES:  Severe multivessel coronary artery disease with  impaired left ventricular function with estimated ejection fraction at  catheterization 25%.  He also had a 2D echocardiogram which revealed  significant disparity in his finding with ejection fraction of 50% to  55%.  He will need further evaluation of this in the future to see where  he stands.   OTHER DIAGNOSES:  1. Extracranial cerebrovascular occlusive disease.  He will require      repeat carotid Duplex in a timeframe to be determined by his      cardiologist, with moderate disease at this point in the 60%  to 79%      stenosis range.  2. Tobacco abuse.  3. Chronic obstructive pulmonary disease.  4. Acute blood loss anemia.  5. Anxiety disorder.  6. History of chronic brain disorder, possibly organic versus related      to substance abuse.  7. History of cholecystectomy.   INSTRUCTIONS:  The patient received written restrictions in regard to  medications, activity,  diet, wound care and followup.  Follow up was to  include Dr. Allyson Sabal 2 weeks postdischarge.  Appointment to have this chest  tube sutures removed on the June 25, 2009, at the CT surgery office.  Dr. Tyrone Sage was to see the patient on July 19, 2009, at 2:30 p.m.      Rowe Clack, P.A.-C.      Sheliah Plane, MD  Electronically Signed    WEG/MEDQ  D:  06/19/2009  T:  06/20/2009  Job:  [id number]   cc:   Doreen Beam, MD  Nanetta Batty, M.D.

## 2011-04-25 NOTE — Discharge Summary (Signed)
Dave [name], Dave [name]               ACCOUNT NO.:  [id number]   MEDICAL RECORD NO.:  [id number]          PATIENT TYPE:  INP   LOCATION:  1824                         FACILITY:  [facility]   PHYSICIAN:  Mohan N. Sharyn Lull, M.D. DATE OF BIRTH:  16-Jan-1956   DATE OF ADMISSION:  05/30/2009  DATE OF DISCHARGE:  05/30/2009                               DISCHARGE SUMMARY   ADMITTING DIAGNOSES:  1. Chest pain, rule out myocardial infarction.  2. Elevated blood pressure, rule out hypertension.  3. Anxiety disorder.  4. Tobacco abuse.  5. Positive family history of coronary artery disease.   DISCHARGE DIAGNOSES:  1. Chest pain, rule out myocardial infarction.  2. Elevated blood pressure, rule out hypertension.  3. Anxiety disorder.  4. Tobacco abuse.  5. Positive family history of coronary artery disease.   The patient signed out against medical advice.  The patient was advised  to call 911 if he gets recurrent chest pain and follow up with me or PMD  in the next few days.   BRIEF HISTORY AND HOSPITAL COURSE:  The patient is a 55 year old white  male with past medical history significant for anxiety disorder, tobacco  abuse, and positive family history of coronary artery disease.  He came  to the ER by [facility] as code STEMI was called.  The patient  complained of retrosternal chest pain described as tightness, radiating  to the neck associated with shortness of breath and diaphoresis lasting  approximately 10-15 minutes.  EKG done on the field showed normal sinus  rhythm with J-point elevation and minor ST-T wave changes.  The patient  received 4 baby aspirin and 3 sublingual nitro with relief of chest  pain.  Denies history of such pain in the past.  Denies exertional chest  pain.  Denies palpitation, lightheadedness, or syncope.  Denies PND,  orthopnea, or leg swelling.  Denies chest pain at present.   PAST MEDICAL HISTORY:  As above.   PAST SURGICAL HISTORY:  He had left lung  surgery in the past.   ALLERGIES:  No known drug allergies.   MEDICATIONS:  None.   SOCIAL HISTORY:  He is married, 2 children, on disability.  He smoked 2  packs per day for 30+ years.  No history of alcohol or drug abuse.   FAMILY HISTORY:  Father died of complications of black lung.  Mother is  alive, she is 32.  He has 2 brothers and 2 sisters, they have coronary  artery disease requiring CABG.   PHYSICAL EXAMINATION:  GENERAL:  He is alert, awake, and oriented x3, in  no acute distress.  VITAL SIGNS:  Blood pressure was 131/95.  Pulse was 77 and regular.  HEENT:  Conjunctivae were pink.  NECK:  Supple.  No JVD.  No bruit.  LUNGS:  Clear to auscultation without rhonchi or rales.  CARDIOVASCULAR:  S1 and S2 was normal.  There was soft systolic murmur.  There was no S3 or S4 gallop.  ABDOMEN:  Soft, bowel sounds present, nontender.  EXTREMITIES:  There is no clubbing, cyanosis, or edema.   EKG showed  normal sinus rhythm, incomplete right bundle-branch block  with nonspecific ST-T wave changes, poor R-wave progression in V1-V3.  The labs were still pending.   BRIEF HOSPITAL COURSE:  The patient was admitted to telemetry unit with  chest pain, rule out MI.  Discussed with the patient regarding left  cath, possible PTCA and stenting, its risks and benefits, i.e., death,  MI, stroke, need for emergency CABG, risk of restenosis, local vascular  complications, etc. and wanted to discuss with his wife before making  any decision.  The patient stayed in the ER for probably 1-2 hours and  decided to leave.  The patient understand the risk of dying, having  massive heart attack, etc. and wanted to sign out against medical  advice.  The patient did not have any further chest pain during the  hospital stay and the patient left hospital from ER itself.      Eduardo Osier. Sharyn Lull, M.D.  Electronically Signed     MNH/MEDQ  D:  06/28/2009  T:  06/29/2009  Job:  [id number]

## 2012-06-29 ENCOUNTER — Encounter: Payer: Self-pay | Admitting: Cardiology

## 2013-09-30 ENCOUNTER — Encounter

## 2015-06-25 ENCOUNTER — Ambulatory Visit: Admit: 2015-06-25 | Payer: PRIVATE HEALTH INSURANCE | Attending: Family Medicine | Primary: Family Medicine

## 2015-06-25 DIAGNOSIS — Z0000 Encounter for general adult medical examination without abnormal findings: Secondary | ICD-10-CM

## 2015-06-25 LAB — AMB POC URINALYSIS DIP STICK AUTO W/O MICRO
Bilirubin (UA POC): NEGATIVE
Blood (UA POC): NEGATIVE
Glucose (UA POC): NEGATIVE
Ketones (UA POC): NEGATIVE
Leukocyte esterase (UA POC): NEGATIVE
Nitrites (UA POC): NEGATIVE
Protein (UA POC): NEGATIVE mg/dL
Specific gravity (UA POC): 1.02 (ref 1.001–1.035)
Urobilinogen (UA POC): 0.2 (ref 0.2–1)
pH (UA POC): 7.5 (ref 4.6–8.0)

## 2015-06-25 NOTE — Progress Notes (Signed)
SUBJECTIVE:   Anthony [name] is a 59 y.o. male who is here for complete physical exam, and here to establish care as a new patient. Pt endorses having a previous PCP, Dr. Izola [name] ; pt's last check up was 1.5 years ago; pt's last blood work was 1.5 years ago.     Pt states Dr. Paulino [name], his cardiologist, preforms a stress test biannually.     Pt reports he is currently struggling with smoking cessation, and notes he is using Nicotine patches. He claims he had quit for two weeks, but started again. Pt states he has tried Chantix with moderate results, Welbutrin without results, and Nicotine gum without results. He reports smoking a pack a day.     Pt claims he had some pulmonary function testing five years ago without significant results.     Pt reports going to the gym 3-4 times a week. Pt states he will be going to [facility] for a few days, and knows he will not be sticking to a diet.     Pt c/o occasional pain in left hip that he attributes to arthritis.     Pt c/o skin thinning on the top of his head where there is no hair.     Pt reports having freckles and moles that do not bother him, but denies seeing a dermatologist for a check up.    Pt states his brother Anthony [name] works at [employer].     Pt specifically denies changes in vision or hearing, trouble with swallowing or taste, CP, SOB, heartburn or upset stomach, change in bowel habits, problems urinating, unusual joint or muscle pains, numbness or tingling in extremities, or skin lesions of concern.    At this time, he is otherwise doing well and has brought no other complaints to my attention today. For a list of the medical issues addressed today, see the assessment and plan below.      PMH:   Past Medical History   Diagnosis Date   ??? Hypercholesterolemia    ??? Chronic obstructive pulmonary disease (HCC)    ??? Hypertension    ??? CAD (coronary artery disease)      heart attack 2011        PSH:  has past surgical history that includes cardiac surg procedure unlist.    Allergies: has No Known Allergies.    Meds:   Current Outpatient Prescriptions   Medication Sig   ??? metoprolol succinate (TOPROL-XL) 50 mg XL tablet    ??? rosuvastatin (CRESTOR) 10 mg tablet    ??? omega-3 fatty acids-vitamin e 1,000 mg cap Take 1 Cap by mouth daily.   ??? aspirin delayed-release 81 mg tablet Take 81 mg by mouth daily.   ??? multivitamin (ONE A DAY) tablet Take 1 Tab by mouth daily.   ??? glucosamine-chondroitin (ARTHX) 500-400 mg cap Take 1 Cap by mouth daily.     No current facility-administered medications for this visit.       Fam hx: family history includes Diabetes in his brother; Heart Disease in his father; Hypertension in his mother.    Soc hx:  reports that he has been smoking.  He has never used smokeless tobacco. He reports that he does not drink alcohol or use illicit drugs.      Review of Systems - History obtained from the patient  General ROS: +difficulty with smoking cessation, otherwise negative  Psychological ROS: negative  Ophthalmic ROS: negative  ENT ROS: negative  Respiratory ROS: no cough, shortness of breath, or wheezing  Cardiovascular ROS: no chest pain or dyspnea on exertion  Gastrointestinal ROS: no abdominal pain, change in bowel habits, or black or bloody stools  Genito-Urinary ROS: negative  Musculoskeletal ROS: negative  Neurological ROS: negative  Dermatological ROS: negative    OBJECTIVE:   Vitals: BP 135/88 mmHg   Pulse 61   Temp(Src) 98.7 ??F (37.1 ??C) (Oral)   Resp 17   Ht 5' 10.5" (1.791 m)   Wt 249 lb 6.4 oz (113.127 kg)   BMI 35.27 kg/m2   SpO2 97%  Gen: Pleasant 59 y.o. male in NAD. HEENT: PERRLA. EOMI. OP moist and pink.  EARS: TMs normal and canals equal bilaterally.  NECK: Supple.  No LAD. No thyromegaly.  HEART: RRR, No M/G/R.   LUNGS: CTAB No W/R.  ABDOMEN: S, NT, ND, BS+.   EXTREMITIES: Warm. No C/C/E.  MUSCULOSKELETAL: Normal ROM,  muscle strength 5/5 all groups.  NEURO: Alert and oriented x 3. Patellar Reflex R>L. Cranial nerves grossly intact.  No focal sensory or motor deficits noted. SKIN: Warm. Dry. No rashes or other lesions noted. PROSTATE: normal rectal exam, external hemorrhoids noted, prostate not enlarged on palpation. Actinic keratosis on scalp. Cheeks and nose: erythema across both cheeks, erythema and thickening of skin on nose. Erythema noted on bilateral buttocks medially.     ASSESSMENT/ PLAN:     Diagnoses and all orders for this visit:    Physical exam, annual  Orders:  -     AMB POC URINALYSIS DIP STICK AUTO W/O MICRO  -     CBC WITH AUTOMATED DIFF  -     METABOLIC PANEL, COMPREHENSIVE  -     HEMOGLOBIN A1C WITH EAG  -     LIPID PANEL  -     T4, FREE  -     TSH 3RD GENERATION  -     PSA - SCREENING ([id number])  -     REFERRAL TO GASTROENTEROLOGY  -     REFERRAL TO DERMATOLOGY      I referred pt to Dr. Sandie Anouckworth (GI) for a colonoscopy.     I encouraged pt to use a Nicotine lozenge for every other cigarette to help with smoking cessation. Once he has decreased his smoking, I will prescribe Chantix. I advised pt to have another pulmonary function testing when he has stopped smoking as much.     I advised pt to use a moisturizer sunscreen on his head daily. I referred pt to Dr. Pernell DupreAdams (dermatology) for investigation of moles.     [name]'s physical exam was normal and urinalysis was clear. Pt was given lab orders for a CBC, CMP, lipid panel, PSA, Hgb A1C, T4, and TSH to have done when he is fasting.     Pt will f/u in 6 months for hld and htn.     Follow-up Disposition:  Return in about 6 months (around 12/26/2015) for  follow up hld and htn.    I have reviewed the patient's medications and risks/side effects/benefits were discussed. Diagnosis(-es) explained to patient and questions answered. Literature provided where appropriate.     Written by Edrick KinsAriana Dickens, Scribekick, as dictated by Jackolyn Conferuth Kamaryn Grimley, MD.

## 2015-06-27 ENCOUNTER — Encounter: Admit: 2015-06-27 | Discharge: 2015-06-28 | Payer: PRIVATE HEALTH INSURANCE | Primary: Family Medicine

## 2015-06-28 LAB — METABOLIC PANEL, COMPREHENSIVE
A-G Ratio: 2.2 (ref 1.1–2.5)
ALT (SGPT): 29 IU/L (ref 0–44)
AST (SGOT): 21 IU/L (ref 0–40)
Albumin: 4.6 g/dL (ref 3.5–5.5)
Alk. phosphatase: 50 IU/L (ref 39–117)
BUN/Creatinine ratio: 16 (ref 9–20)
BUN: 13 mg/dL (ref 6–24)
Bilirubin, total: 0.3 mg/dL (ref 0.0–1.2)
CO2: 26 mmol/L (ref 18–29)
Calcium: 9.7 mg/dL (ref 8.7–10.2)
Chloride: 98 mmol/L (ref 97–108)
Creatinine: 0.81 mg/dL (ref 0.76–1.27)
GFR est AA: 114 mL/min/{1.73_m2} (ref >59)
GFR est non-AA: 99 mL/min/{1.73_m2} (ref >59)
GLOBULIN, TOTAL: 2.1 g/dL (ref 1.5–4.5)
Glucose: 125 mg/dL — ABNORMAL HIGH (ref 65–99)
Potassium: 4.8 mmol/L (ref 3.5–5.2)
Protein, total: 6.7 g/dL (ref 6.0–8.5)
Sodium: 139 mmol/L (ref 134–144)

## 2015-06-28 LAB — CBC WITH AUTOMATED DIFF
ABS. BASOPHILS: 0 10*3/uL (ref 0.0–0.2)
ABS. EOSINOPHILS: 0.2 10*3/uL (ref 0.0–0.4)
ABS. IMM. GRANS.: 0 10*3/uL (ref 0.0–0.1)
ABS. MONOCYTES: 0.6 10*3/uL (ref 0.1–0.9)
ABS. NEUTROPHILS: 3.1 10*3/uL (ref 1.4–7.0)
Abs Lymphocytes: 1.9 10*3/uL (ref 0.7–3.1)
BASOPHILS: 1 %
EOSINOPHILS: 4 %
HCT: 43.6 % (ref 37.5–51.0)
HGB: 14.4 g/dL (ref 12.6–17.7)
IMMATURE GRANULOCYTES: 0 %
Lymphocytes: 32 %
MCH: 31 pg (ref 26.6–33.0)
MCHC: 33 g/dL (ref 31.5–35.7)
MCV: 94 fL (ref 79–97)
MONOCYTES: 10 %
NEUTROPHILS: 53 %
PLATELET: 222 10*3/uL (ref 150–379)
RBC: 4.65 x10E6/uL (ref 4.14–5.80)
RDW: 13 % (ref 12.3–15.4)
WBC: 5.9 10*3/uL (ref 3.4–10.8)

## 2015-06-28 LAB — HEMOGLOBIN A1C WITH EAG
Estimated average glucose: 140 mg/dL
Hemoglobin A1c: 6.5 % — ABNORMAL HIGH (ref 4.8–5.6)

## 2015-06-28 LAB — TSH 3RD GENERATION: TSH: 2.32 u[IU]/mL (ref 0.450–4.500)

## 2015-06-28 LAB — LIPID PANEL
Cholesterol, total: 119 mg/dL (ref 100–199)
HDL Cholesterol: 36 mg/dL — ABNORMAL LOW (ref >39)
LDL, calculated: 67 mg/dL (ref 0–99)
Triglyceride: 78 mg/dL (ref 0–149)
VLDL, calculated: 16 mg/dL (ref 5–40)

## 2015-06-28 LAB — T4, FREE: T4, Free: 1.03 ng/dL (ref 0.82–1.77)

## 2015-06-28 LAB — PSA SCREENING (SCREENING): Prostate Specific Ag: 2 ng/mL (ref 0.0–4.0)

## 2015-06-28 NOTE — Addendum Note (Signed)
Addended by: Loma Messing R on: 06/28/2015 02:01 PM      Modules accepted: Level of Service

## 2015-06-28 NOTE — Progress Notes (Signed)
Quick Note:        Please send a letter informing this patient of the results. This patient needs to f/u in 3 mo for diabetes.    CBC-Normal red and white blood cell levels.    CMP-Normal electrolyte levels except for a elevation in glucose levels, normal renal, and liver function.    Lipid panel-normal except for a low hdl    A1c-Your current hgbA1c is high and in the diabetic range.    Work on following a diabetic diet and exercise.    Recheck this test: hgbA1c in 3 months.    Follow up in 3 mo.                Thyroid function-normal    PSA-normal    ______

## 2015-06-29 NOTE — Progress Notes (Signed)
Quick Note:        Letter mailed    ______

## 2015-12-26 ENCOUNTER — Ambulatory Visit: Admit: 2015-12-26 | Payer: PRIVATE HEALTH INSURANCE | Attending: Family Medicine | Primary: Family Medicine

## 2015-12-26 DIAGNOSIS — I10 Essential (primary) hypertension: Secondary | ICD-10-CM

## 2015-12-26 NOTE — Progress Notes (Signed)
SUBJECTIVE:   [name] is a 60 y.o. male who is here for follow up of htn.      Pt's BP is well controlled at 122/77 today.     Pt reports right rotator cuff issues, but notes it is better than it was 2 weeks ago.     Pt endorses lowering sugar and carbohydrate intake. However, he notes he could do better. Pt claims he averages 2 trips to the gym/week.     Pt is requesting a referral to another sleep study. Pt claims he needs to upgrade his CPAP because his last study was 15 years ago.     Pt reports his last ophthalmology appointment was about 2 years ago. Pt denies changes in vision.     Pt states he has frequent left hip pain and occasional tingling in left foot. 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 Pt notes the tingling is less with exercising, and has decreased compared to last year.     ROUTINE HEALTH MAINTENANCE:  PREVENTIVE:   Flu: up to date according to pt   Hep C: ordered today.     At this time, he is otherwise doing well and has brought no other complaints to my attention today.  For a list of the medical issues addressed today, see the assessment and plan below.    PMH:   Past Medical History   Diagnosis Date   ??? CAD (coronary artery disease)      heart attack 2011   ??? Chronic obstructive pulmonary disease (HCC)    ??? Hypercholesterolemia    ??? Hypertension      PSH:  has a past surgical history that includes cardiac surg procedure unlist.    All: has No Known Allergies.   MEDS:   Current Outpatient Prescriptions   Medication Sig   ??? metoprolol succinate (TOPROL-XL) 50 mg XL tablet    ??? rosuvastatin (CRESTOR) 10 mg tablet    ??? omega-3 fatty acids-vitamin e 1,000 mg cap Take 1 Cap by mouth daily.   ??? aspirin delayed-release 81 mg tablet Take 81 mg by mouth daily.   ??? multivitamin (ONE A DAY) tablet Take 1 Tab by mouth daily.   ??? glucosamine-chondroitin (ARTHX) 500-400 mg cap Take 1 Cap by mouth daily.     No current facility-administered medications for this visit.         FH: family history includes Diabetes in his brother; Heart Disease in his father; Hypertension in his mother.   SH:  reports that he has been smoking.  He has a 40.00 pack-year smoking history. He has never used smokeless tobacco. He reports that he does not drink alcohol or use illicit drugs.     Review of Systems - History obtained from the patient  General ROS: negative  Psychological ROS: negative  Ophthalmic ROS: negative  ENT ROS: negative  Respiratory ROS: no cough, shortness of breath, or wheezing  Cardiovascular ROS: no chest pain or dyspnea on exertion  Gastrointestinal ROS: no abdominal pain, change in bowel habits, or black or bloody stools  Genito-Urinary ROS: negative  Musculoskeletal ROS: negative  Neurological ROS: negative  Dermatological ROS: negative    OBJECTIVE:   Vitals:   Visit Vitals   ??? BP 122/77 (BP 1 Location: Left arm, BP Patient Position: Sitting)   ??? Pulse 69   ??? Temp 98.2 ??F (36.8 ??C) (Oral)   ??? Resp 16   ??? Ht 5' 10.5" (1.791 m)   ???  Wt 250 lb (113.4 kg)   ??? SpO2 95%   ??? BMI 35.36 kg/m2      Gen: Pleasant 60 y.o.  male in NAD.  HEENT: NC/AT.  HEART: RRR, No M/G/R. LUNGS: CTAB No W/R. EXTREMITIES: Warm. No C/C/E. NEURO: Alert and oriented x 3.  Cranial nerves grossly intact.  No focal sensory or motor deficits noted. SKIN: Warm. Dry. No rashes or other lesions noted.    ASSESSMENT/ PLAN:     [name] was seen today for complete physical and follow-up.    Diagnoses and all orders for this visit:    Essential hypertension  -     METABOLIC PANEL, COMPREHENSIVE    OSA (obstructive sleep apnea)  -     REFERRAL TO SLEEP STUDIES    Controlled type 2 diabetes mellitus without complication, without long-term current use of insulin (HCC)  -     HEMOGLOBIN A1C WITH EAG  -     METABOLIC PANEL, COMPREHENSIVE  -     MICROALBUMIN, UR, RAND W/ MICROALBUMIN/CREA RATIO    Need for hepatitis C screening test  -     HEPATITIS C AB       This patient's blood pressure is stable and controlled on the current  medication. Pt will continue on current htn medication.     Pt was referred to Dr. Evelene Croon (sleep study) for sleep apnea.     I ordered Hep C, CMP, microalbumin, and Hgb A1C labs for monitoring of medication, DM, and hepatitis C screening.     I recommended the pt use a wedge when he drives to relieve pressure on tailbone.     If pt's Hgb A1C increases, then I will mail a lab to be done in 3 months.     Pt will f/u in 6 months for CPE/fasting labs.     Follow-up Disposition:  Return in about 6 months (around 06/26/2016) for CPE/fasting labs.    I have reviewed the patient's medications and risks/side effects/benefits were discussed. Diagnosis(-es) explained to patient and questions answered. Literature provided where appropriate.     Written by Edrick Kins, Sc[employer]ekick, as dictated by Jackolyn Confer, MD.

## 2015-12-26 NOTE — Progress Notes (Signed)
1. Have you been to the ER, urgent care clinic since your last visit?  Hospitalized since your last visit?no    2. Have you seen or consulted any other health care providers outside of the [facility] since your last visit?  Include any pap smears or colon screening. no

## 2015-12-27 LAB — HEPATITIS C ANTIBODY: HCV Ab: <0.1 s/co ratio (ref 0.0–0.9)

## 2015-12-27 LAB — METABOLIC PANEL, COMPREHENSIVE
A-G Ratio: 2 (ref 1.1–2.5)
ALT (SGPT): 35 IU/L (ref 0–44)
AST (SGOT): 25 IU/L (ref 0–40)
Albumin: 4.7 g/dL (ref 3.5–5.5)
Alk. phosphatase: 55 IU/L (ref 39–117)
BUN/Creatinine ratio: 18 (ref 9–20)
BUN: 14 mg/dL (ref 6–24)
Bilirubin, total: 0.3 mg/dL (ref 0.0–1.2)
CO2: 25 mmol/L (ref 18–29)
Calcium: 9.6 mg/dL (ref 8.7–10.2)
Chloride: 101 mmol/L (ref 96–106)
Creatinine: 0.8 mg/dL (ref 0.76–1.27)
GFR est AA: 115 mL/min/{1.73_m2} (ref >59)
GFR est non-AA: 99 mL/min/{1.73_m2} (ref >59)
GLOBULIN, TOTAL: 2.3 g/dL (ref 1.5–4.5)
Glucose: 122 mg/dL — ABNORMAL HIGH (ref 65–99)
Potassium: 4.5 mmol/L (ref 3.5–5.2)
Protein, total: 7 g/dL (ref 6.0–8.5)
Sodium: 140 mmol/L (ref 134–144)

## 2015-12-27 LAB — HEMOGLOBIN A1C WITH EAG
Estimated average glucose: 140 mg/dL
Hemoglobin A1c: 6.5 % — ABNORMAL HIGH (ref 4.8–5.6)

## 2015-12-27 LAB — MICROALBUMIN, UR, RAND W/ MICROALB/CREAT RATIO
Creatinine, urine random: 34.5 mg/dL
Microalb/Creat ratio (ug/mg creat.): <8.7 mg/g creat (ref 0.0–30.0)
Microalbumin, urine: <3 ug/mL

## 2015-12-27 LAB — HEPATITIS C AB: Hep C Virus Ab: <0.1 s/co ratio (ref 0.0–0.9)

## 2015-12-28 ENCOUNTER — Telehealth

## 2015-12-28 NOTE — Telephone Encounter (Signed)
Please let this patient know his A1c is stable at 6.5 and he will need a repeat lab in 38m. I placed the order today. His next office visit should be 6 mo from his most recent visit.

## 2016-01-01 NOTE — Telephone Encounter (Signed)
I called pt, no answer- left voice message to return call to office.

## 2016-01-02 NOTE — Telephone Encounter (Signed)
Letter printed by Dr Ruth Latham on 12/27/15 has been mailed out to pt.

## 2016-01-02 NOTE — Telephone Encounter (Signed)
Patient states he's returning your call from yesterday. Please call. Thank you

## 2016-01-15 ENCOUNTER — Ambulatory Visit
Admit: 2016-01-15 | Discharge: 2016-01-15 | Payer: PRIVATE HEALTH INSURANCE | Attending: Pediatric Pulmonology | Primary: Family Medicine

## 2016-01-15 DIAGNOSIS — G4733 Obstructive sleep apnea (adult) (pediatric): Secondary | ICD-10-CM

## 2016-01-15 NOTE — Progress Notes (Signed)
[address] [place], [place] [address]  Tel.  [phone]  Fax. [phone] [address]  [place], [place] [address]  Tel.  [phone]  Fax. [phone] [address]  [place], [place] [address]  Tel.  [phone]  Fax. [phone]         Subjective:      Anthony [name] is an 60 y.o. male referred for evaluation for a sleep disorder. He complains of snoring associated with periods of not breathing.  Symptoms began several years ago, he was diagnosed with OSA and treated with CPAP therapy since that time. He reports of snoring on CPAP therapy and feels his machine is not functioning properly.  He usually can fall asleep in 5 minutes.  Family or house members note snoring. He denies completely or partially paralyzed while falling asleep or waking up.  Anthony [name] does wake up frequently at night. He is not bothered by waking up too early and left unable to get back to sleep. He actually sleeps about 7 hours at night and wakes up about 3 times during the night. He does not work shifts:  Anthony [name] indicates he does not get too little sleep at night. His bedtime is 2230. He awakens at 0500. He does take naps. He takes 3 naps a week lasting 1 hour. He has the following observed behaviors: Loud snoring, Pauses in breathing - prior to CPAP therapy; Kicking with legs - legs twitch during sleep.  Other remarks: waking with gasp or snort    Epworth Sleepiness Score: 9 which reflect mild daytime drowsiness.    No Known Allergies      Current Outpatient Prescriptions:   ???  metoprolol succinate (TOPROL-XL) 50 mg XL tablet, , Disp: , Rfl: 5  ???  rosuvastatin (CRESTOR) 10 mg tablet, , Disp: , Rfl: 1  ???  omega-3 fatty acids-vitamin e 1,000 mg cap, Take 1 Cap by mouth daily., Disp: , Rfl:   ???  aspirin delayed-release 81 mg tablet, Take 81 mg by mouth daily., Disp: , Rfl:   ???  multivitamin (ONE A DAY) tablet, Take 1 Tab by mouth daily., Disp: , Rfl:    ???  glucosamine-chondroitin (ARTHX) 500-400 mg cap, Take 1 Cap by mouth daily., Disp: , Rfl:      He  has a past medical history of CAD (coronary artery disease); Chronic obstructive pulmonary disease (HCC); Hypercholesterolemia; and Hypertension.    He  has a past surgical history that includes cardiac surg procedure unlist.    He family history includes Diabetes in his brother; Heart Disease in his father; Hypertension in his mother.    He  reports that he has been smoking.  He has a 40.00 pack-year smoking history. He has never used smokeless tobacco. He reports that he does not drink alcohol or use illicit drugs.     Review of Systems:  Constitutional:  No significant weight loss or weight gain.  Eyes:  No blurred vision.  CVS:  No significant chest pain  Pulm:  No significant shortness of breath  GI:  No significant nausea or vomiting  GU:  No significant nocturia  Musculoskeletal:  No significant joint pain at night  Skin:  No significant rashes  Neuro:  No significant dizziness   Psych:  No active mood issues    Sleep Review of Systems: notable for no difficulty falling asleep; frequent awakenings at night;  regular dreaming not reported; no nightmares ; no early  morning headaches; no memory problems; no concentration issues; no history of any automobile or occupational accidents due to daytime drowsiness.      Objective:     Visit Vitals   ??? BP 137/86   ??? Pulse 79   ??? Ht 5' 10.5" (1.791 m)   ??? Wt 254 lb (115.2 kg)   ??? SpO2 98%   ??? BMI 35.93 kg/m2         General:   Not in acute distress   Eyes:  Anicteric sclerae, no obvious strabismus   Nose:  No obvious nasal septum deviation    Oropharynx:   Class 4 oropharyngeal outlet, thick tongue base, uvula could not be seen due to low-lying soft palate, narrow tonsilo-pharyngeal pilars   Tonsils:   tonsils are not seen due to low-lying soft palate   Neck:   Neck circ. in "inches": 18.5; midline trachea   Chest/Lungs:  Equal lung expansion, clear on auscultation     CVS:  Normal rate, regular rhythm; no JVD   Skin:  Warm to touch; no obvious rashes   Neuro:  No focal deficits ; no obvious tremor    Psych:  Normal affect,  normal countenance;          Assessment:       ICD-10-CM ICD-9-CM    1. OSA (obstructive sleep apnea) G47.33 327.23 SPLIT CPAP/PSG   2. Chronic obstructive pulmonary disease, unspecified COPD type (HCC) J44.9 496 SPLIT CPAP/PSG   3. Essential hypertension I10 401.9    4. BMI 35.0-35.9,adult Z68.35 V85.35          Plan:     * The patient currently has a High Risk for having sleep apnea.  STOP-BANG score 8.  * Sleep testing was ordered for initial evaluation.    * He was provided information on sleep apnea including coresponding risk factors and the importance of proper treatment.   * Treatment options if indicated were reviewed today. New device to be prescribed after testing.  * Counseling was provided regarding proper sleep hygiene (including effect of light on sleep) and safe driving.  * Effect of sleep disturbance on weight was reviewed. We have recommended a dedicated weight loss through appropriate diet and an exercise regiment as significant weight reduction has been shown to reduce severity of obstructive sleep apnea.         Thank you for allowing [place] to participate in your patient's medical care.  We'll keep you updated on these investigations.    Romana Juniper, MD, FAASM  Diplomate American Board of Sleep Medicine  Diplomate in Sleep Medicine - ABP  Electronically signed.

## 2016-01-15 NOTE — Patient Instructions (Signed)
[address]  [place] [address]  Tel.  [phone]  Fax. [phone] [address]  [place] [address]  Tel.  [phone]  Fax. [phone] [address] [place] [address]  Tel.  [phone]  Fax. [phone]     Sleep Apnea: After Your Visit  Your Care Instructions  Sleep apnea occurs when you frequently stop breathing for 10 seconds or longer during sleep. It can be mild to severe, based on the number of times per hour that you stop breathing or have slowed breathing. Blocked or narrowed airways in your nose, mouth, or throat can cause sleep apnea. Your airway can become blocked when your throat muscles and tongue relax during sleep.  Sleep apnea is common, occurring in 1 out of 20 individuals.  Individuals having any of the following characteristics should be evaluated and treated right away due to high risk and detrimental consequences from untreated sleep apnea:  1. Obesity  2. Congestive Heart failure  3. Atrial Fibrillation  4. Uncontrolled Hypertension  5. Type II Diabetes  6. Night-time Arrhythmias  7. Stroke  8. Pulmonary Hypertension  9. High-risk Driving Populations (pilots, truck drivers, etc.)  10. Patients Considering Weight-loss Surgery    How do you know you have sleep apnea?  You probably have sleep apnea if you answer 'yes' to 3 or more of the following questions:  S - Have you been told that you Snore?   T - Are you often Tired during the day?  O - Has anyone Observed you stop breathing while sleeping?  P- Do you have (or are being treated for) high blood Pressure?    B - Are you obese (Body Mass Index > 35)?  A - Is your Age 50 years old or older?  N - Is your Neck size greater than 16 inches?  G - Are you male Gender?  A sleep physician can prescribe a breathing device that prevents tissues in the throat from blocking your airway. Or your doctor may recommend using a dental device (oral breathing device) to help keep your airway  open. In some cases, surgery may be needed to remove enlarged tissues in the throat.  Follow-up care is a key part of your treatment and safety. Be sure to make and go to all appointments, and call your doctor if you are having problems. It's also a good idea to know your test results and keep a list of the medicines you take.  How can you care for yourself at home?  ?? Lose weight, if needed. It may reduce the number of times you stop breathing or have slowed breathing.  ?? Go to bed at the same time every night.  ?? Sleep on your side. It may stop mild apnea. If you tend to roll onto your back, sew a pocket in the back of your pajama top. Put a tennis ball into the pocket, and stitch the pocket shut. This will help keep you from sleeping on your back.  ?? Avoid alcohol and medicines such as sleeping pills and sedatives before bed.  ?? Do not smoke. Smoking can make sleep apnea worse. If you need help quitting, talk to your doctor about stop-smoking programs and medicines. These can increase your chances of quitting for good.  ?? Prop up the head of your bed 4 to 6 inches by putting bricks under the legs of the bed.  ?? Treat breathing problems, such as a stuffy nose, caused   by a cold or allergies.  ?? Use a continuous positive airway pressure (CPAP) breathing machine if lifestyle changes do not help your apnea and your doctor recommends it. The machine keeps your airway from closing when you sleep.  ?? If CPAP does not help you, ask your doctor whether you should try other breathing machines. A bilevel positive airway pressure machine has two types of air pressure????????one for breathing in and one for breathing out. Another device raises or lowers air pressure as needed while you breathe.  ?? If your nose feels dry or bleeds when using one of these machines, talk with your doctor about increasing moisture in the air. A humidifier may help.  ?? If your nose is runny or stuffy from using a breathing machine, talk  with your doctor about using decongestants or a corticosteroid nasal spray.  When should you call for help?  Watch closely for changes in your health, and be sure to contact your doctor if:  ?? You still have sleep apnea even though you have made lifestyle changes.  ?? You are thinking of trying a device such as CPAP.  ?? You are having problems using a CPAP or similar machine.                Where can you learn more?   Go to http://www.[email or web address].net/[email or web address].  Enter J936 in the search box to learn more about "Sleep Apnea: After Your Visit."   ?? 2006-2010 Healthwise, Inc[employer]. Care instructions adapted under license by [employer] (which disclaims liability or warranty for this information). This care instruction is for use with your licensed healthcare professional. If you have questions about a medical condition or this instruction, always ask your healthcare professional. Healthwise disclaims any warranty or liability for your use of this information.      PROPER SLEEP HYGIENE    What to avoid  ?? Do not have drinks with caffeine, such as coffee or black tea, for 8 hours before bed.  ?? Do not smoke or use other types of tobacco near bedtime. Nicotine is a stimulant and can keep you awake.  ?? Avoid drinking alcohol late in the evening, because it can cause you to wake in the middle of the night.  ?? Do not eat a big meal close to bedtime. If you are hungry, eat a light snack.  ?? Do not drink a lot of water close to bedtime, because the need to urinate may wake you up during the night.  ?? Do not read or watch TV in bed. Use the bed only for sleeping and sexual activity.  What to try  ?? Go to bed at the same time every night, and wake up at the same time every morning. Do not take naps during the day.  ?? Keep your bedroom quiet, dark, and cool.  ?? Get regular exercise, but not within 3 to 4 hours of your bedtime..  ?? Sleep on a comfortable pillow and mattress.   ?? If watching the clock makes you anxious, turn it facing away from you so you cannot see the time.  ?? If you worry when you lie down, start a worry book. Well before bedtime, write down your worries, and then set the book and your concerns aside.  ?? Try meditation or other relaxation techniques before you go to bed.  ?? If you cannot fall asleep, get up and go to another room until you feel sleepy. Do something relaxing. Repeat your bedtime routine   before you go to bed again.  ?? Make your house quiet and calm about an hour before bedtime. Turn down the lights, turn off the TV, log off the computer, and turn down the volume on music. This can help you relax after a busy day.    Drowsy Driving  The [place] National Highway Traffic Safety Administration cites drowsiness as a causing factor in more than 100,000 police reported crashes annually, resulting in 76,000 injuries and 1,500 deaths. Other surveys suggest 55% of people polled have driven while drowsy in the past year, 23% had fallen asleep but not crashed, 3% crashed, and 2% had and accident due to drowsy driving.  Who is at risk?   Young Drivers: One study of drowsy driving accidents states that 55% of the drivers were under 25 years. Of those, 75% were male.   Shift Workers and Travelers: People who work overnight or travel across time zones frequently are at higher risk of experiencing Circadian Rhythm Disorders. They are trying to work and function when their body is programed to sleep.   Sleep Deprived: Lack of sleep has a serious impact on your ability to pay attention or focus on a task. Consistently getting less than the average of 8 hours your body needs creates partial or cumulative sleep deprivation.   Untreated Sleep Disorders: Sleep Apnea, Narcolepsy, R.L.S., and other sleep disorders (untreated) prevent a person from getting enough restful sleep. This leads to excessive daytime sleepiness and increases the risk  for drowsy driving accidents by up to 7 times.  Medications / Alcohol: Even over the counter medications can cause drowsiness. Medications that impair a drivers attention should have a warning label. Alcohol naturally makes you sleepy and on its own can cause accidents. Combined with excessive drowsiness its effects are amplified.   Signs of Drowsy Driving:   * You don't remember driving the last few miles   * You may drift out of your lane   * You are unable to focus and your thoughts wander   * You may yawn more often than normal   * You have difficulty keeping your eyes open / nodding off   * Missing traffic signs, speeding, or tailgating  Prevention-   Good sleep hygiene, lifestyle and behavioral choices have the most impact on drowsy driving. There is no substitute for sleep and the average person requires 8 hours nightly. If you find yourself driving drowsy, stop and sleep. Consider the sleep hygiene tips provided during your visit as well.     Medication Refill Policy: Refills for all medications require 1 week advance notice. Please have your pharmacy fax a refill request. We are unable to fax, or call in "controled substance" medications and you will need to pick these prescriptions up from our office.     MyChart Activation    Thank you for requesting access to MyChart. Please follow the instructions below to securely access and download your online medical record. MyChart allows you to send messages to your doctor, view your test results, renew your prescriptions, schedule appointments, and more.    How Do I Sign Up?    1. In your internet browser, go to [email or web address].  2. Click on the First Time User? Click Here link in the Sign In box. You will see the New Member Sign Up page.  3. Enter your MyChart Access Code exactly as it appears below. You will not need to use this code after you???ve completed the sign-up process. If    you do not sign up before the expiration date, you must request a new code.    MyChart Access Code: [email or web address]  Expires: 03/25/2016 10:43 AM (This is the date your MyChart access code will expire)    4. Enter the last four digits of your Social Security Number (xxxx) and Date of Birth (mm/dd/yyyy) as indicated and click Submit. You will be taken to the next sign-up page.  5. Create a MyChart ID. This will be your MyChart login ID and cannot be changed, so think of one that is secure and easy to remember.  6. Create a MyChart password. You can change your password at any time.  7. Enter your Password Reset Question and Answer. This can be used at a later time if you forget your password.   8. Enter your e-mail address. You will receive e-mail notification when new information is available in MyChart.  9. Click Sign Up. You can now view and download portions of your medical record.  10. Click the Download Summary menu link to download a portable copy of your medical information.    Additional Information    If you have questions, please call [phone]. Remember, MyChart is NOT to be used for urgent needs. For medical emergencies, dial 911.

## 2016-01-29 DIAGNOSIS — E7800 Pure hypercholesterolemia, unspecified: Secondary | ICD-10-CM | POA: Diagnosis not present

## 2016-01-29 DIAGNOSIS — I2510 Atherosclerotic heart disease of native coronary artery without angina pectoris: Secondary | ICD-10-CM | POA: Diagnosis not present

## 2016-01-29 DIAGNOSIS — M545 Low back pain: Secondary | ICD-10-CM | POA: Diagnosis not present

## 2016-01-31 NOTE — Telephone Encounter (Addendum)
Per documentation lab letter mailed to patient. Closing encounter.

## 2016-03-06 DIAGNOSIS — G4730 Sleep apnea, unspecified: Secondary | ICD-10-CM

## 2016-03-07 ENCOUNTER — Inpatient Hospital Stay: Admit: 2016-03-07 | Payer: BLUE CROSS/BLUE SHIELD | Primary: Family Medicine

## 2016-03-11 ENCOUNTER — Telehealth

## 2016-03-11 NOTE — Telephone Encounter (Signed)
Orders Placed This Encounter   ??? AMB SUPPLY ORDER     Diagnosis: Obstructive Sleep Apnea ICD-10 Code (G47.33)    Positive Airway Pressure Therapy: Duration of need: 99 months.   ResMed APAP Device: Minimum Pressure: 4 cmH2O, Maximum Pressure: 20 cmH2O.  Ramp Time: 30 Minutes.  EPR: 2.    CPAP mask -  Patient preference, headgear, tubing, and filter;  heated humidifier; wireless modem. Remote monitoring enrollment.    Romana JuniperNaim S. Andre Gallego, MD, FAASM; NPI: [id number]  Diplomate American Board of Sleep Medicine  Electronically signed. Date: 03-11-16

## 2016-03-13 NOTE — Telephone Encounter (Signed)
Working on

## 2016-03-13 NOTE — Telephone Encounter (Signed)
Sent on 03/13/16.  Needs adherence appt.

## 2016-03-18 DIAGNOSIS — M545 Low back pain: Secondary | ICD-10-CM | POA: Diagnosis not present

## 2016-03-18 DIAGNOSIS — I2510 Atherosclerotic heart disease of native coronary artery without angina pectoris: Secondary | ICD-10-CM | POA: Diagnosis not present

## 2016-03-18 DIAGNOSIS — Z720 Tobacco use: Secondary | ICD-10-CM | POA: Diagnosis not present

## 2016-03-31 NOTE — Telephone Encounter (Signed)
I called pt to see if he received PAP machine and he has not.  Gave him [facility] contact information.  I told him once he receives machine, he needs to call us to get scheduled for adherence visit. (60-90 day)

## 2016-04-14 DIAGNOSIS — I2510 Atherosclerotic heart disease of native coronary artery without angina pectoris: Secondary | ICD-10-CM | POA: Diagnosis not present

## 2016-04-14 DIAGNOSIS — E7800 Pure hypercholesterolemia, unspecified: Secondary | ICD-10-CM | POA: Diagnosis not present

## 2016-04-16 DIAGNOSIS — M25512 Pain in left shoulder: Secondary | ICD-10-CM | POA: Diagnosis not present

## 2016-04-16 DIAGNOSIS — Z299 Encounter for prophylactic measures, unspecified: Secondary | ICD-10-CM | POA: Diagnosis not present

## 2016-05-19 DIAGNOSIS — E7800 Pure hypercholesterolemia, unspecified: Secondary | ICD-10-CM | POA: Diagnosis not present

## 2016-05-19 DIAGNOSIS — I2510 Atherosclerotic heart disease of native coronary artery without angina pectoris: Secondary | ICD-10-CM | POA: Diagnosis not present

## 2016-09-18 DIAGNOSIS — J449 Chronic obstructive pulmonary disease, unspecified: Secondary | ICD-10-CM | POA: Diagnosis not present

## 2016-09-18 DIAGNOSIS — Z79899 Other long term (current) drug therapy: Secondary | ICD-10-CM | POA: Diagnosis not present

## 2016-09-18 DIAGNOSIS — R0602 Shortness of breath: Secondary | ICD-10-CM | POA: Diagnosis not present

## 2016-09-18 DIAGNOSIS — J678 Hypersensitivity pneumonitis due to other organic dusts: Secondary | ICD-10-CM | POA: Diagnosis not present

## 2016-09-18 DIAGNOSIS — Z87891 Personal history of nicotine dependence: Secondary | ICD-10-CM | POA: Diagnosis not present

## 2016-09-18 DIAGNOSIS — F17200 Nicotine dependence, unspecified, uncomplicated: Secondary | ICD-10-CM | POA: Diagnosis not present

## 2016-09-18 DIAGNOSIS — Z951 Presence of aortocoronary bypass graft: Secondary | ICD-10-CM | POA: Diagnosis not present

## 2016-09-18 DIAGNOSIS — Z7982 Long term (current) use of aspirin: Secondary | ICD-10-CM | POA: Diagnosis not present

## 2016-09-18 DIAGNOSIS — I252 Old myocardial infarction: Secondary | ICD-10-CM | POA: Diagnosis not present

## 2016-09-18 DIAGNOSIS — J441 Chronic obstructive pulmonary disease with (acute) exacerbation: Secondary | ICD-10-CM | POA: Diagnosis not present

## 2016-09-22 DIAGNOSIS — I252 Old myocardial infarction: Secondary | ICD-10-CM | POA: Diagnosis not present

## 2016-09-22 DIAGNOSIS — Z951 Presence of aortocoronary bypass graft: Secondary | ICD-10-CM | POA: Diagnosis not present

## 2016-09-22 DIAGNOSIS — Z79899 Other long term (current) drug therapy: Secondary | ICD-10-CM | POA: Diagnosis not present

## 2016-09-22 DIAGNOSIS — J209 Acute bronchitis, unspecified: Secondary | ICD-10-CM | POA: Diagnosis not present

## 2016-09-22 DIAGNOSIS — R0682 Tachypnea, not elsewhere classified: Secondary | ICD-10-CM | POA: Diagnosis not present

## 2016-09-22 DIAGNOSIS — J441 Chronic obstructive pulmonary disease with (acute) exacerbation: Secondary | ICD-10-CM | POA: Diagnosis not present

## 2016-09-22 DIAGNOSIS — R0602 Shortness of breath: Secondary | ICD-10-CM | POA: Diagnosis not present

## 2016-09-22 DIAGNOSIS — Z7982 Long term (current) use of aspirin: Secondary | ICD-10-CM | POA: Diagnosis not present

## 2016-09-22 DIAGNOSIS — F17200 Nicotine dependence, unspecified, uncomplicated: Secondary | ICD-10-CM | POA: Diagnosis not present

## 2016-09-24 DIAGNOSIS — J209 Acute bronchitis, unspecified: Secondary | ICD-10-CM | POA: Diagnosis not present

## 2016-09-24 DIAGNOSIS — F17200 Nicotine dependence, unspecified, uncomplicated: Secondary | ICD-10-CM | POA: Diagnosis not present

## 2016-09-24 DIAGNOSIS — Z299 Encounter for prophylactic measures, unspecified: Secondary | ICD-10-CM | POA: Diagnosis not present

## 2016-09-24 DIAGNOSIS — I429 Cardiomyopathy, unspecified: Secondary | ICD-10-CM | POA: Diagnosis not present

## 2016-09-24 DIAGNOSIS — Z720 Tobacco use: Secondary | ICD-10-CM | POA: Diagnosis not present

## 2016-09-24 DIAGNOSIS — J441 Chronic obstructive pulmonary disease with (acute) exacerbation: Secondary | ICD-10-CM | POA: Diagnosis not present

## 2016-11-04 DIAGNOSIS — Z720 Tobacco use: Secondary | ICD-10-CM | POA: Diagnosis not present

## 2016-11-04 DIAGNOSIS — Z1211 Encounter for screening for malignant neoplasm of colon: Secondary | ICD-10-CM | POA: Diagnosis not present

## 2016-11-04 DIAGNOSIS — Z1389 Encounter for screening for other disorder: Secondary | ICD-10-CM | POA: Diagnosis not present

## 2016-11-04 DIAGNOSIS — Z299 Encounter for prophylactic measures, unspecified: Secondary | ICD-10-CM | POA: Diagnosis not present

## 2016-11-04 DIAGNOSIS — Z7189 Other specified counseling: Secondary | ICD-10-CM | POA: Diagnosis not present

## 2016-11-04 DIAGNOSIS — Z0000 Encounter for general adult medical examination without abnormal findings: Secondary | ICD-10-CM | POA: Diagnosis not present

## 2016-11-08 DIAGNOSIS — F17200 Nicotine dependence, unspecified, uncomplicated: Secondary | ICD-10-CM | POA: Diagnosis not present

## 2016-11-08 DIAGNOSIS — R0682 Tachypnea, not elsewhere classified: Secondary | ICD-10-CM | POA: Diagnosis not present

## 2016-11-08 DIAGNOSIS — I509 Heart failure, unspecified: Secondary | ICD-10-CM | POA: Diagnosis not present

## 2016-11-08 DIAGNOSIS — I5032 Chronic diastolic (congestive) heart failure: Secondary | ICD-10-CM | POA: Diagnosis not present

## 2016-11-08 DIAGNOSIS — J209 Acute bronchitis, unspecified: Secondary | ICD-10-CM | POA: Diagnosis not present

## 2016-11-08 DIAGNOSIS — I2510 Atherosclerotic heart disease of native coronary artery without angina pectoris: Secondary | ICD-10-CM | POA: Diagnosis not present

## 2016-11-08 DIAGNOSIS — J440 Chronic obstructive pulmonary disease with acute lower respiratory infection: Secondary | ICD-10-CM | POA: Diagnosis not present

## 2016-11-08 DIAGNOSIS — R079 Chest pain, unspecified: Secondary | ICD-10-CM | POA: Diagnosis not present

## 2016-11-08 DIAGNOSIS — R0602 Shortness of breath: Secondary | ICD-10-CM | POA: Diagnosis not present

## 2016-11-08 DIAGNOSIS — J441 Chronic obstructive pulmonary disease with (acute) exacerbation: Secondary | ICD-10-CM | POA: Diagnosis not present

## 2016-11-09 DIAGNOSIS — I2510 Atherosclerotic heart disease of native coronary artery without angina pectoris: Secondary | ICD-10-CM | POA: Diagnosis present

## 2016-11-09 DIAGNOSIS — Z23 Encounter for immunization: Secondary | ICD-10-CM | POA: Diagnosis not present

## 2016-11-09 DIAGNOSIS — J209 Acute bronchitis, unspecified: Secondary | ICD-10-CM | POA: Diagnosis present

## 2016-11-09 DIAGNOSIS — Z7982 Long term (current) use of aspirin: Secondary | ICD-10-CM | POA: Diagnosis not present

## 2016-11-09 DIAGNOSIS — R079 Chest pain, unspecified: Secondary | ICD-10-CM | POA: Diagnosis not present

## 2016-11-09 DIAGNOSIS — J441 Chronic obstructive pulmonary disease with (acute) exacerbation: Secondary | ICD-10-CM | POA: Diagnosis present

## 2016-11-09 DIAGNOSIS — J440 Chronic obstructive pulmonary disease with acute lower respiratory infection: Secondary | ICD-10-CM | POA: Diagnosis present

## 2016-11-09 DIAGNOSIS — I5032 Chronic diastolic (congestive) heart failure: Secondary | ICD-10-CM | POA: Diagnosis present

## 2016-11-09 DIAGNOSIS — R0602 Shortness of breath: Secondary | ICD-10-CM | POA: Diagnosis not present

## 2016-11-09 DIAGNOSIS — Z79899 Other long term (current) drug therapy: Secondary | ICD-10-CM | POA: Diagnosis not present

## 2016-11-09 DIAGNOSIS — F17200 Nicotine dependence, unspecified, uncomplicated: Secondary | ICD-10-CM | POA: Diagnosis present

## 2016-11-09 DIAGNOSIS — Z951 Presence of aortocoronary bypass graft: Secondary | ICD-10-CM | POA: Diagnosis not present

## 2016-11-09 DIAGNOSIS — I252 Old myocardial infarction: Secondary | ICD-10-CM | POA: Diagnosis not present

## 2016-11-11 ENCOUNTER — Encounter: Payer: Self-pay | Admitting: *Deleted

## 2016-11-12 ENCOUNTER — Encounter: Payer: Self-pay | Admitting: *Deleted

## 2016-11-12 ENCOUNTER — Ambulatory Visit (INDEPENDENT_AMBULATORY_CARE_PROVIDER_SITE_OTHER): Payer: Medicare Other | Admitting: Cardiology

## 2016-11-12 ENCOUNTER — Encounter: Payer: Self-pay | Admitting: Cardiology

## 2016-11-12 VITALS — BP 97/60 | HR 82 | Ht 69.0 in | Wt 184.2 lb

## 2016-11-12 DIAGNOSIS — R0602 Shortness of breath: Secondary | ICD-10-CM | POA: Diagnosis not present

## 2016-11-12 DIAGNOSIS — I4729 Other ventricular tachycardia: Secondary | ICD-10-CM

## 2016-11-12 DIAGNOSIS — I472 Ventricular tachycardia: Secondary | ICD-10-CM | POA: Diagnosis not present

## 2016-11-12 DIAGNOSIS — I2510 Atherosclerotic heart disease of native coronary artery without angina pectoris: Secondary | ICD-10-CM | POA: Diagnosis not present

## 2016-11-12 DIAGNOSIS — R0989 Other specified symptoms and signs involving the circulatory and respiratory systems: Secondary | ICD-10-CM

## 2016-11-12 NOTE — Patient Instructions (Signed)
Your physician recommends that you schedule a follow-up appointment in: 1 MONTH WITH DR. BRANCH   Your physician recommends that you continue on your current medications as directed. Please refer to the Current Medication list given to you today.  Your physician has requested that you have an echocardiogram. Echocardiography is a painless test that uses sound waves to create images of your heart. It provides your doctor with information about the size and shape of your heart and how well your heart's chambers and valves are working. This procedure takes approximately one hour. There are no restrictions for this procedure.  Your physician has requested that you have a carotid duplex. This test is an ultrasound of the carotid arteries in your neck. It looks at blood flow through these arteries that supply the brain with blood. Allow one hour for this exam. There are no restrictions or special instructions.  Thank you for choosing [facility]!!

## 2016-11-12 NOTE — Progress Notes (Signed)
Clinical Summary Mr. Su HiltRoberts is a 60 y.o.male seen as new patient. Previously seen by Dr Andee LinemaneGent in 2011.   1. CAD: 06/2009 CABG x 3 (LIMA-LAD, SVG-diag, SVG-PDA). He presented with an anterior MI.  - from prior clinic notes, LVEF 40% (do not see echo report) - no recent chest pain - recent SOB. Seen ER 3 times over the last 2 months. DOE chronic but can flare up. Exertion mainly limited by chronic hip pain, but SOB with short distance. No LE edema, no orthopnea - denies any coughing or wheezing.  - compliant with meds  2. SOB - [facility] -CXR mild edema, chronic COPD changes.  CT PE negative. Diffuse coronary calcifcations   3. COPD - diagnosis mentioned in chart, however he is unaware of PFTs   4. NSVT - denies any recent palpitations.    Past Medical History:  Diagnosis Date  . Anxiety disorder   . Chest pain   . Chronic brain syndrome   . Elevated blood pressure   . Tobacco abuse      Allergies not on file   Current Outpatient Prescriptions  Medication Sig Dispense Refill  . aspirin 325 MG EC tablet Take 325 mg by mouth daily.    . carvedilol (COREG) 3.125 MG tablet Take 3.125 mg by mouth 2 (two) times daily with a meal.    . lisinopril (PRINIVIL,ZESTRIL) 10 MG tablet Take 10 mg by mouth daily.    . simvastatin (ZOCOR) 40 MG tablet Take 40 mg by mouth every evening.     No current facility-administered medications for this visit.      Past Surgical History:  Procedure Laterality Date  . CARDIAC CATHETERIZATION    . CHOLECYSTECTOMY     2007  . CORONARY ARTERY BYPASS GRAFT     X3     NKDA    Family History  Problem Relation Age of Onset  . Coronary artery disease       Social History Mr. Su HiltRoberts reports that he has been smoking.  He does not have any smokeless tobacco history on file. Mr. Su HiltRoberts has no alcohol history on file.   Review of Systems CONSTITUTIONAL: No weight loss, fever, chills, weakness or  fatigue.  HEENT: Eyes: No visual loss, blurred vision, double vision or yellow sclerae.No hearing loss, sneezing, congestion, runny nose or sore throat.  SKIN: No rash or itching.  CARDIOVASCULAR: per HPI RESPIRATORY: No shortness of breath, cough or sputum.  GASTROINTESTINAL: No anorexia, nausea, vomiting or diarrhea. No abdominal pain or blood.  GENITOURINARY: No burning on urination, no polyuria NEUROLOGICAL: No headache, dizziness, syncope, paralysis, ataxia, numbness or tingling in the extremities. No change in bowel or bladder control.  MUSCULOSKELETAL: No muscle, back pain, joint pain or stiffness.  LYMPHATICS: No enlarged nodes. No history of splenectomy.  PSYCHIATRIC: No history of depression or anxiety.  ENDOCRINOLOGIC: No reports of sweating, cold or heat intolerance. No polyuria or polydipsia.  Marland Kitchen.   Physical Examination Vitals:   11/12/16 1325  BP: 97/60  Pulse: 82   Vitals:   11/12/16 1325  Weight: 184 lb 3.2 oz (83.6 kg)  Height: 5\' 9"  (1.753 m)    Gen: resting comfortably, no acute distress HEENT: no scleral icterus, pupils equal round and reactive, no palptable cervical adenopathy,  CV: RRR, no m/r/g, no jvd. +bilatearl carotid bruits Resp: Clear to auscultation bilaterally GI: abdomen is soft, non-tender, non-distended, normal bowel sounds, no hepatosplenomegaly MSK: extremities are  warm, no edema.  Skin: warm, no rash Neuro:  no focal deficits Psych: appropriate affect   Diagnostic Studies  06/2009 cath   IMPRESSION:  1. Severe global left ventricular dysfunction with an ejection      fraction of approximately 25% with significant hypocontractility to      akinesis involving an inferior wall hypocontractility      anterolaterally and significant hypocontractility in the      inferolateral wall.  2. Significant multivessel coronary obstructive disease with separate      ostium giving rise to the left anterior descending and left      circumflex  vessels with diffuse 70% ostial proximal left anterior      descending stenosis, 60% left anterior descending stenosis after      the takeoff of the first septal and diagonal vessel and bifurcation      95% and 80% septal mid left anterior descending stenosis.  3. Large circumflex system with questionable ostial catheter spasm.  4. Total occlusion of the right coronary artery with extensive left-to-      right collaterals.  5. An 80% left vertebral artery narrowing arising from the left      circumflex system with evidence for plaque in the proximal      subclavian system with a widely patent unbypassed left internal      mammary artery.  6. 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   RECOMMENDATIONS:  The patient will be evaluated for consideration of  CABG revascularization surgery.      Assessment and Plan  1. CAD - recent SOB, unclear if cardiac etiology. Several years since he has had cardiac evaluation - we will obtain echo, pending results likely consider exericse nuclear stress  2. SOB - cardiac evaluation as described above. Request previous PFT results from pcp as he is listed as having history of COPD, only on rescue inhaler  3. NSVT - nore recent symptoms. Currently not on medical therapy for suppressoin - f/u echo results to evaluate for structural heart disease - EKG in clniic shows SR, occasional PVCs.   4. Carotid Bruits - obtain carotid US  F/u 1 month   Antoine PocheJonathan F. Valeen Borys, M.D.

## 2016-12-02 DIAGNOSIS — Z87891 Personal history of nicotine dependence: Secondary | ICD-10-CM | POA: Diagnosis not present

## 2016-12-02 DIAGNOSIS — I252 Old myocardial infarction: Secondary | ICD-10-CM | POA: Diagnosis not present

## 2016-12-02 DIAGNOSIS — J441 Chronic obstructive pulmonary disease with (acute) exacerbation: Secondary | ICD-10-CM | POA: Diagnosis not present

## 2016-12-02 DIAGNOSIS — R0602 Shortness of breath: Secondary | ICD-10-CM | POA: Diagnosis not present

## 2016-12-02 DIAGNOSIS — Z951 Presence of aortocoronary bypass graft: Secondary | ICD-10-CM | POA: Diagnosis not present

## 2016-12-02 DIAGNOSIS — Z7982 Long term (current) use of aspirin: Secondary | ICD-10-CM | POA: Diagnosis not present

## 2016-12-02 DIAGNOSIS — Z79899 Other long term (current) drug therapy: Secondary | ICD-10-CM | POA: Diagnosis not present

## 2016-12-04 ENCOUNTER — Ambulatory Visit: Payer: Medicare Other

## 2016-12-04 DIAGNOSIS — I6523 Occlusion and stenosis of bilateral carotid arteries: Secondary | ICD-10-CM | POA: Diagnosis not present

## 2016-12-04 DIAGNOSIS — R0989 Other specified symptoms and signs involving the circulatory and respiratory systems: Secondary | ICD-10-CM

## 2016-12-04 DIAGNOSIS — R0602 Shortness of breath: Secondary | ICD-10-CM

## 2016-12-04 LAB — VAS US CAROTID
LCCADSYS: 76 cm/s
LCCAPDIAS: 33 cm/s
LCCAPSYS: 101 cm/s
LEFT ECA DIAS: -31 cm/s
LEFT VERTEBRAL DIAS: -11 cm/s
Left CCA dist dias: 30 cm/s
Left ICA dist dias: -34 cm/s
Left ICA dist sys: -90 cm/s
Left ICA prox dias: -57 cm/s
Left ICA prox sys: -280 cm/s
RCCADSYS: -56 cm/s
RCCAPDIAS: 26 cm/s
RCCAPSYS: 80 cm/s
RIGHT ECA DIAS: -24 cm/s
RIGHT VERTEBRAL DIAS: -30 cm/s

## 2016-12-09 DIAGNOSIS — R0602 Shortness of breath: Secondary | ICD-10-CM | POA: Diagnosis not present

## 2016-12-09 DIAGNOSIS — I472 Ventricular tachycardia: Secondary | ICD-10-CM | POA: Diagnosis not present

## 2016-12-09 DIAGNOSIS — I482 Chronic atrial fibrillation: Secondary | ICD-10-CM | POA: Diagnosis not present

## 2016-12-09 DIAGNOSIS — R0682 Tachypnea, not elsewhere classified: Secondary | ICD-10-CM | POA: Diagnosis not present

## 2016-12-09 DIAGNOSIS — I10 Essential (primary) hypertension: Secondary | ICD-10-CM | POA: Diagnosis not present

## 2016-12-09 DIAGNOSIS — I2510 Atherosclerotic heart disease of native coronary artery without angina pectoris: Secondary | ICD-10-CM | POA: Diagnosis not present

## 2016-12-09 DIAGNOSIS — J441 Chronic obstructive pulmonary disease with (acute) exacerbation: Secondary | ICD-10-CM | POA: Diagnosis not present

## 2016-12-09 DIAGNOSIS — F419 Anxiety disorder, unspecified: Secondary | ICD-10-CM | POA: Diagnosis not present

## 2016-12-09 DIAGNOSIS — J440 Chronic obstructive pulmonary disease with acute lower respiratory infection: Secondary | ICD-10-CM | POA: Diagnosis not present

## 2016-12-09 DIAGNOSIS — J209 Acute bronchitis, unspecified: Secondary | ICD-10-CM | POA: Diagnosis not present

## 2016-12-09 DIAGNOSIS — R7989 Other specified abnormal findings of blood chemistry: Secondary | ICD-10-CM | POA: Diagnosis not present

## 2016-12-10 DIAGNOSIS — I252 Old myocardial infarction: Secondary | ICD-10-CM | POA: Diagnosis not present

## 2016-12-10 DIAGNOSIS — J441 Chronic obstructive pulmonary disease with (acute) exacerbation: Secondary | ICD-10-CM | POA: Diagnosis present

## 2016-12-10 DIAGNOSIS — F419 Anxiety disorder, unspecified: Secondary | ICD-10-CM | POA: Diagnosis present

## 2016-12-10 DIAGNOSIS — I472 Ventricular tachycardia: Secondary | ICD-10-CM | POA: Diagnosis present

## 2016-12-10 DIAGNOSIS — F17200 Nicotine dependence, unspecified, uncomplicated: Secondary | ICD-10-CM | POA: Diagnosis present

## 2016-12-10 DIAGNOSIS — J440 Chronic obstructive pulmonary disease with acute lower respiratory infection: Secondary | ICD-10-CM | POA: Diagnosis present

## 2016-12-10 DIAGNOSIS — I2510 Atherosclerotic heart disease of native coronary artery without angina pectoris: Secondary | ICD-10-CM | POA: Diagnosis present

## 2016-12-10 DIAGNOSIS — Z79899 Other long term (current) drug therapy: Secondary | ICD-10-CM | POA: Diagnosis not present

## 2016-12-10 DIAGNOSIS — J209 Acute bronchitis, unspecified: Secondary | ICD-10-CM | POA: Diagnosis present

## 2016-12-10 DIAGNOSIS — Z7982 Long term (current) use of aspirin: Secondary | ICD-10-CM | POA: Diagnosis not present

## 2016-12-15 ENCOUNTER — Encounter: Payer: Self-pay | Admitting: *Deleted

## 2016-12-16 ENCOUNTER — Encounter: Payer: Self-pay | Admitting: *Deleted

## 2016-12-16 ENCOUNTER — Telehealth: Payer: Self-pay | Admitting: Cardiology

## 2016-12-16 ENCOUNTER — Ambulatory Visit (INDEPENDENT_AMBULATORY_CARE_PROVIDER_SITE_OTHER): Payer: Medicare Other | Admitting: Cardiology

## 2016-12-16 VITALS — BP 103/63 | HR 84 | Ht 69.0 in | Wt 194.8 lb

## 2016-12-16 DIAGNOSIS — I5021 Acute systolic (congestive) heart failure: Secondary | ICD-10-CM

## 2016-12-16 DIAGNOSIS — R0602 Shortness of breath: Secondary | ICD-10-CM

## 2016-12-16 MED ORDER — METOPROLOL SUCCINATE ER 25 MG PO TB24: 12.5000 mg | ORAL_TABLET | Freq: Every day | ORAL | 3 refills | Status: DC

## 2016-12-16 MED ORDER — FUROSEMIDE 20 MG PO TABS: ORAL_TABLET | ORAL | 3 refills | Status: DC

## 2016-12-16 MED ORDER — ATORVASTATIN CALCIUM 80 MG PO TABS: 80.0000 mg | ORAL_TABLET | Freq: Every day | ORAL | 3 refills | Status: DC

## 2016-12-16 NOTE — Telephone Encounter (Signed)
No precert required.  Pt has Medicare and Medicaid. °

## 2016-12-16 NOTE — Patient Instructions (Addendum)
Your physician recommends that you schedule a follow-up appointment in: 2 WEEKS WITH DR. BRANCH  Your physician has recommended you make the following change in your medication:   INCREASE ATORVASTATIN 80 MG DAILY  START TOPROL XL 12.5 MG DAILY  START LASIX 20 MG DAILY AS NEEDED FOR SWELLING   Your physician has requested that you have a cardiac catheterization. Cardiac catheterization is used to diagnose and/or treat various heart conditions. Doctors may recommend this procedure for a number of different reasons. The most common reason is to evaluate chest pain. Chest pain can be a symptom of coronary artery disease (CAD), and cardiac catheterization can show whether plaque is narrowing or blocking your heart's arteries. This procedure is also used to evaluate the valves, as well as measure the blood flow and oxygen levels in different parts of your heart. For further information please visit [email or web address]. Please follow instruction sheet, as given.  Your physician recommends that you return for lab work TOMORROW MORNING - PLEASE FAST PRIOR TO LAB WORK LIPIDS/TSH/CBC/BMP

## 2016-12-16 NOTE — Progress Notes (Signed)
Clinical Summary Dave [name] is a 61 y.o.male seen today for follow up of the following medical problems.     1. CAD: 06/2009 CABG x 3 (LIMA-LAD, SVG-diag, SVG-PDA). He presented with an anterior MI.  - from prior clinic notes, LVEF 40% (do not see echo report) at that time.  - no recent chest pain - recent SOB. Seen ER 3 times over the last 2 months. DOE chronic but can flare up. Exertion mainly limited by chronic hip pain, but SOB with short distance. No LE edema, no orthopnea - denies any coughing or wheezing.  - compliant with meds  - since last visit he completed an echo that showed LVEF 15%, diffuse hypokinesis. The report is available only in merge, I have contacted the echo staff about transitioning report to epic.  - since last visit admitted to [facility] admitted for about 6 days. Treated with diuretics and breathing treatments per his report, we are awaiting records. .   2. COPD - diagnosis mentioned in chart, however he is unaware of PFTs   3. NSVT - denies any recent palpitations.  Past Medical History:  Diagnosis Date  . Anxiety disorder   . Chest pain   . Chronic brain syndrome   . Elevated blood pressure   . Tobacco abuse      No Known Allergies   Current Outpatient Prescriptions  Medication Sig Dispense Refill  . albuterol (PROVENTIL) (2.5 MG/3ML) 0.083% nebulizer solution Inhale 1 mL into the lungs as needed.  99  . aspirin EC 81 MG tablet Take 81 mg by mouth daily.    Marland Kitchen atorvastatin (LIPITOR) 10 MG tablet Take 1 tablet by mouth daily.    . cefUROXime (CEFTIN) 250 MG tablet Take 1 tablet by mouth 2 (two) times daily.    . furosemide (LASIX) 20 MG tablet Take 1 tablet by mouth daily.    . methylPREDNISolone (MEDROL DOSEPAK) 4 MG TBPK tablet Take 1 tablet by mouth as directed. Dose pack    . Omega-3 Fatty Acids (FISH OIL) 1000 MG CPDR Take 1 capsule by mouth daily.    Marland Kitchen PROAIR HFA 108 (90 Base) MCG/ACT inhaler Inhale 2 puffs into the lungs 4 (four)  times daily as needed.    . vitamin B-12 (CYANOCOBALAMIN) 1000 MCG tablet Take 1,000 mcg by mouth daily.     No current facility-administered medications for this visit.      Past Surgical History:  Procedure Laterality Date  . CARDIAC CATHETERIZATION    . CHOLECYSTECTOMY     2007  . CORONARY ARTERY BYPASS GRAFT     X3     No Known Allergies    Family History  Problem Relation Age of Onset  . Coronary artery disease       Social History Dave [name] reports that he has been smoking.  He has never used smokeless tobacco. Dave [name] reports that he does not drink alcohol.   Review of Systems CONSTITUTIONAL: No weight loss, fever, chills, weakness or fatigue.  HEENT: Eyes: No visual loss, blurred vision, double vision or yellow sclerae.No hearing loss, sneezing, congestion, runny nose or sore throat.  SKIN: No rash or itching.  CARDIOVASCULAR: per HPI RESPIRATORY: per HPI GASTROINTESTINAL: No anorexia, nausea, vomiting or diarrhea. No abdominal pain or blood.  GENITOURINARY: No burning on urination, no polyuria NEUROLOGICAL: No headache, dizziness, syncope, paralysis, ataxia, numbness or tingling in the extremities. No change in bowel or bladder control.  MUSCULOSKELETAL: No muscle, back pain, joint  pain or stiffness.  LYMPHATICS: No enlarged nodes. No history of splenectomy.  PSYCHIATRIC: No history of depression or anxiety.  ENDOCRINOLOGIC: No reports of sweating, cold or heat intolerance. No polyuria or polydipsia.  Marland Kitchen.   Physical Examination Vitals:   12/16/16 1400  BP: 103/63  Pulse: 84   Vitals:   12/16/16 1400  Weight: 194 lb 12.8 oz (88.4 kg)  Height: 5\' 9"  (1.753 m)    Gen: resting comfortably, no acute distress HEENT: no scleral icterus, pupils equal round and reactive, no palptable cervical adenopathy,  CV: RRR, no m/r/g, no jvd Resp: Clear to auscultation bilaterally GI: abdomen is soft, non-tender, non-distended, normal bowel sounds, no  hepatosplenomegaly MSK: extremities are warm, no edema.  Skin: warm, no rash Neuro:  no focal deficits Psych: appropriate affect   Diagnostic Studies  06/2009 cath  IMPRESSION: 1. Severe global left ventricular dysfunction with an ejection fraction of approximately 25% with significant hypocontractility to akinesis involving an inferior wall hypocontractility anterolaterally and significant hypocontractility in the inferolateral wall. 2. Significant multivessel coronary obstructive disease with separate ostium giving rise to the left anterior descending and left circumflex vessels with diffuse 70% ostial proximal left anterior descending stenosis, 60% left anterior descending stenosis after the takeoff of the first septal and diagonal vessel and bifurcation 95% and 80% septal mid left anterior descending stenosis. 3. Large circumflex system with questionable ostial catheter spasm. 4. Total occlusion of the right coronary artery with extensive left-to- right collaterals. 5. An 80% left vertebral artery narrowing arising from the left circumflex system with evidence for plaque in the proximal subclavian system with a widely patent unbypassed left internal mammary artery. 6. 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  RECOMMENDATIONS: The patient will be evaluated for consideration of CABG revascularization surgery.       Assessment and Plan   1. CAD/ICM/Acute systolic HF - progressing SOB/DOE, no significant chest pain - recent echo shows LVEF 15% (report in merge only currently, to be transferred to Epic), a new finding for the patient - concern for recurrence of CAD given drop in LVEF and DOE. We will refer for LHC/RHC - start Toprol XL 12.5mg  daily. Increase atorva to 80mg  daily in setting of CAD. Start lasix 20mg  daily  prn swelling.  - if bp's tolerate low dose beta blocker, then start low dose ACE-I.       Antoine PocheJonathan F. Dail Meece, M.D.

## 2016-12-16 NOTE — Telephone Encounter (Signed)
PERCERT VERIFICATION FOR :  [facility] 12/18/16@7 :30 AM DR END

## 2016-12-17 ENCOUNTER — Other Ambulatory Visit: Payer: Self-pay | Admitting: Cardiology

## 2016-12-17 DIAGNOSIS — I5021 Acute systolic (congestive) heart failure: Secondary | ICD-10-CM | POA: Diagnosis not present

## 2016-12-18 ENCOUNTER — Encounter (HOSPITAL_COMMUNITY): Payer: Self-pay | Admitting: Internal Medicine

## 2016-12-18 ENCOUNTER — Ambulatory Visit (HOSPITAL_COMMUNITY)
Admission: RE | Admit: 2016-12-18 | Discharge: 2016-12-19 | Disposition: A | Discharge status: Home / Self Care | Payer: Medicare Other | Source: Ambulatory Visit | Attending: Internal Medicine | Admitting: Internal Medicine

## 2016-12-18 ENCOUNTER — Encounter (HOSPITAL_COMMUNITY)
Admission: RE | Disposition: A | Discharge status: Home / Self Care | Payer: Self-pay | Source: Ambulatory Visit | Attending: Internal Medicine

## 2016-12-18 ENCOUNTER — Other Ambulatory Visit: Payer: Self-pay

## 2016-12-18 DIAGNOSIS — I2582 Chronic total occlusion of coronary artery: Secondary | ICD-10-CM | POA: Diagnosis not present

## 2016-12-18 DIAGNOSIS — F17210 Nicotine dependence, cigarettes, uncomplicated: Secondary | ICD-10-CM | POA: Insufficient documentation

## 2016-12-18 DIAGNOSIS — Z8249 Family history of ischemic heart disease and other diseases of the circulatory system: Secondary | ICD-10-CM | POA: Diagnosis not present

## 2016-12-18 DIAGNOSIS — I429 Cardiomyopathy, unspecified: Secondary | ICD-10-CM

## 2016-12-18 DIAGNOSIS — M7989 Other specified soft tissue disorders: Secondary | ICD-10-CM | POA: Diagnosis not present

## 2016-12-18 DIAGNOSIS — I255 Ischemic cardiomyopathy: Secondary | ICD-10-CM | POA: Insufficient documentation

## 2016-12-18 DIAGNOSIS — I272 Pulmonary hypertension, unspecified: Secondary | ICD-10-CM | POA: Insufficient documentation

## 2016-12-18 DIAGNOSIS — F419 Anxiety disorder, unspecified: Secondary | ICD-10-CM | POA: Diagnosis not present

## 2016-12-18 DIAGNOSIS — J449 Chronic obstructive pulmonary disease, unspecified: Secondary | ICD-10-CM | POA: Diagnosis not present

## 2016-12-18 DIAGNOSIS — I252 Old myocardial infarction: Secondary | ICD-10-CM | POA: Diagnosis not present

## 2016-12-18 DIAGNOSIS — I5021 Acute systolic (congestive) heart failure: Secondary | ICD-10-CM | POA: Diagnosis present

## 2016-12-18 DIAGNOSIS — Z7982 Long term (current) use of aspirin: Secondary | ICD-10-CM | POA: Diagnosis not present

## 2016-12-18 DIAGNOSIS — I472 Ventricular tachycardia: Secondary | ICD-10-CM | POA: Diagnosis not present

## 2016-12-18 DIAGNOSIS — I5023 Acute on chronic systolic (congestive) heart failure: Secondary | ICD-10-CM | POA: Insufficient documentation

## 2016-12-18 DIAGNOSIS — I2584 Coronary atherosclerosis due to calcified coronary lesion: Secondary | ICD-10-CM | POA: Diagnosis not present

## 2016-12-18 DIAGNOSIS — I25810 Atherosclerosis of coronary artery bypass graft(s) without angina pectoris: Secondary | ICD-10-CM

## 2016-12-18 DIAGNOSIS — G8929 Other chronic pain: Secondary | ICD-10-CM | POA: Diagnosis not present

## 2016-12-18 DIAGNOSIS — R0602 Shortness of breath: Secondary | ICD-10-CM | POA: Diagnosis present

## 2016-12-18 DIAGNOSIS — I110 Hypertensive heart disease with heart failure: Secondary | ICD-10-CM | POA: Insufficient documentation

## 2016-12-18 DIAGNOSIS — R0609 Other forms of dyspnea: Secondary | ICD-10-CM | POA: Diagnosis present

## 2016-12-18 DIAGNOSIS — Z955 Presence of coronary angioplasty implant and graft: Secondary | ICD-10-CM

## 2016-12-18 HISTORY — DX: ST elevation (STEMI) myocardial infarction involving other coronary artery of anterior wall: I21.09

## 2016-12-18 HISTORY — DX: Anxiety disorder, unspecified: F41.9

## 2016-12-18 HISTORY — PX: CARDIAC CATHETERIZATION: SHX172

## 2016-12-18 LAB — POCT I-STAT 3, ART BLOOD GAS (G3+)
ACID-BASE EXCESS: 6 mmol/L — AB (ref 0.0–2.0)
BICARBONATE: 32.3 mmol/L — AB (ref 20.0–28.0)
O2 SAT: 96 %
PO2 ART: 80 mmHg — AB (ref 83.0–108.0)
TCO2: 34 mmol/L (ref 0–100)
pCO2 arterial: 51.6 mmHg — ABNORMAL HIGH (ref 32.0–48.0)
pH, Arterial: 7.404 (ref 7.350–7.450)

## 2016-12-18 LAB — PROTIME-INR
INR: 1.07
PROTHROMBIN TIME: 14 s (ref 11.4–15.2)

## 2016-12-18 LAB — POCT ACTIVATED CLOTTING TIME
ACTIVATED CLOTTING TIME: 312 s
Activated Clotting Time: 230 seconds

## 2016-12-18 SURGERY — RIGHT/LEFT HEART CATH AND CORONARY/GRAFT ANGIOGRAPHY
Anesthesia: LOCAL

## 2016-12-18 MED ORDER — ATORVASTATIN CALCIUM 80 MG PO TABS
80.0000 mg | ORAL_TABLET | Freq: Every day | ORAL | Status: DC
  Administered 2016-12-18 – 2016-12-19 (×2): 80 mg via ORAL
  Filled 2016-12-18: qty 1

## 2016-12-18 MED ORDER — SODIUM CHLORIDE 0.9% FLUSH: 3.0000 mL | INTRAVENOUS | Status: DC | PRN

## 2016-12-18 MED ORDER — BIVALIRUDIN BOLUS VIA INFUSION - CUPID
INTRAVENOUS | Status: DC | PRN
  Administered 2016-12-18: 61.2 mg via INTRAVENOUS

## 2016-12-18 MED ORDER — TICAGRELOR 90 MG PO TABS
90.0000 mg | ORAL_TABLET | Freq: Two times a day (BID) | ORAL | Status: DC
  Administered 2016-12-18 – 2016-12-19 (×2): 90 mg via ORAL
  Filled 2016-12-18: qty 1

## 2016-12-18 MED ORDER — HEPARIN (PORCINE) IN NACL 2-0.9 UNIT/ML-% IJ SOLN
INTRAMUSCULAR | Status: DC | PRN
  Administered 2016-12-18: 1000 mL

## 2016-12-18 MED ORDER — SODIUM CHLORIDE 0.9% FLUSH: 3.0000 mL | Freq: Two times a day (BID) | INTRAVENOUS | Status: DC

## 2016-12-18 MED ORDER — ASPIRIN 81 MG PO CHEW: 81.0000 mg | CHEWABLE_TABLET | ORAL | Status: DC

## 2016-12-18 MED ORDER — MIDAZOLAM HCL 2 MG/2ML IJ SOLN
INTRAMUSCULAR | Status: DC | PRN
  Administered 2016-12-18 (×2): 1 mg via INTRAVENOUS

## 2016-12-18 MED ORDER — FENTANYL CITRATE (PF) 100 MCG/2ML IJ SOLN
INTRAMUSCULAR | Status: DC | PRN
  Administered 2016-12-18: 50 ug via INTRAVENOUS

## 2016-12-18 MED ORDER — IOPAMIDOL (ISOVUE-370) INJECTION 76%
INTRAVENOUS | Status: DC | PRN
  Administered 2016-12-18: 185 mL via INTRA_ARTERIAL

## 2016-12-18 MED ORDER — ALBUTEROL SULFATE HFA 108 (90 BASE) MCG/ACT IN AERS: 2.0000 | INHALATION_SPRAY | Freq: Four times a day (QID) | RESPIRATORY_TRACT | Status: DC | PRN

## 2016-12-18 MED ORDER — METOPROLOL SUCCINATE ER 25 MG PO TB24: 12.5000 mg | ORAL_TABLET | Freq: Every day | ORAL | Status: DC

## 2016-12-18 MED ORDER — SODIUM CHLORIDE 0.9 % IV SOLN
INTRAVENOUS | Status: DC | PRN
  Administered 2016-12-18: 10 mL/h via INTRAVENOUS

## 2016-12-18 MED ORDER — NITROGLYCERIN 1 MG/10 ML FOR IR/CATH LAB
INTRA_ARTERIAL | Status: DC | PRN
  Administered 2016-12-18: 200 ug via INTRACORONARY

## 2016-12-18 MED ORDER — HEPARIN (PORCINE) IN NACL 2-0.9 UNIT/ML-% IJ SOLN
INTRAMUSCULAR | Status: AC
  Filled 2016-12-18: qty 1000

## 2016-12-18 MED ORDER — FUROSEMIDE 20 MG PO TABS
20.0000 mg | ORAL_TABLET | Freq: Every day | ORAL | Status: DC
  Filled 2016-12-18 (×2): qty 1

## 2016-12-18 MED ORDER — IOPAMIDOL (ISOVUE-370) INJECTION 76%
INTRAVENOUS | Status: AC
  Filled 2016-12-18: qty 125

## 2016-12-18 MED ORDER — IOPAMIDOL (ISOVUE-370) INJECTION 76%
INTRAVENOUS | Status: AC
  Filled 2016-12-18: qty 100

## 2016-12-18 MED ORDER — BIVALIRUDIN 250 MG IV SOLR
INTRAVENOUS | Status: AC
  Filled 2016-12-18: qty 250

## 2016-12-18 MED ORDER — FUROSEMIDE 10 MG/ML IJ SOLN
20.0000 mg | Freq: Once | INTRAMUSCULAR | Status: AC
  Administered 2016-12-18: 20 mg via INTRAVENOUS
  Filled 2016-12-18: qty 2

## 2016-12-18 MED ORDER — ACETAMINOPHEN 325 MG PO TABS: 650.0000 mg | ORAL_TABLET | ORAL | Status: DC | PRN

## 2016-12-18 MED ORDER — SODIUM CHLORIDE 0.9 % IV SOLN: 250.0000 mL | INTRAVENOUS | Status: DC | PRN

## 2016-12-18 MED ORDER — SODIUM CHLORIDE 0.9 % IV SOLN
INTRAVENOUS | Status: DC
  Administered 2016-12-18: 07:00:00 via INTRAVENOUS

## 2016-12-18 MED ORDER — LABETALOL HCL 5 MG/ML IV SOLN: 10.0000 mg | INTRAVENOUS | Status: AC | PRN

## 2016-12-18 MED ORDER — ASPIRIN EC 81 MG PO TBEC
81.0000 mg | DELAYED_RELEASE_TABLET | Freq: Every day | ORAL | Status: DC
  Administered 2016-12-19: 10:00:00 81 mg via ORAL
  Filled 2016-12-18: qty 1

## 2016-12-18 MED ORDER — IOPAMIDOL (ISOVUE-370) INJECTION 76%
INTRAVENOUS | Status: AC
  Filled 2016-12-18: qty 50

## 2016-12-18 MED ORDER — ANGIOPLASTY BOOK
Freq: Once | Status: AC
  Administered 2016-12-18: 21:00:00
  Filled 2016-12-18: qty 1

## 2016-12-18 MED ORDER — NITROGLYCERIN 1 MG/10 ML FOR IR/CATH LAB
INTRA_ARTERIAL | Status: AC
  Filled 2016-12-18: qty 10

## 2016-12-18 MED ORDER — ONDANSETRON HCL 4 MG/2ML IJ SOLN: 4.0000 mg | Freq: Four times a day (QID) | INTRAMUSCULAR | Status: DC | PRN

## 2016-12-18 MED ORDER — LIDOCAINE HCL (PF) 1 % IJ SOLN
INTRAMUSCULAR | Status: AC
  Filled 2016-12-18: qty 30

## 2016-12-18 MED ORDER — FENTANYL CITRATE (PF) 100 MCG/2ML IJ SOLN
INTRAMUSCULAR | Status: AC
  Filled 2016-12-18: qty 2

## 2016-12-18 MED ORDER — ALBUTEROL SULFATE (2.5 MG/3ML) 0.083% IN NEBU: 2.5000 mg | INHALATION_SOLUTION | RESPIRATORY_TRACT | Status: DC | PRN

## 2016-12-18 MED ORDER — BIVALIRUDIN 250 MG IV SOLR
INTRAVENOUS | Status: DC | PRN
  Administered 2016-12-18: 1.75 mg/kg/h via INTRAVENOUS

## 2016-12-18 MED ORDER — HYDRALAZINE HCL 20 MG/ML IJ SOLN: 5.0000 mg | INTRAMUSCULAR | Status: AC | PRN

## 2016-12-18 MED ORDER — TICAGRELOR 90 MG PO TABS
ORAL_TABLET | ORAL | Status: DC | PRN
  Administered 2016-12-18: 180 mg via ORAL

## 2016-12-18 MED ORDER — SODIUM CHLORIDE 0.9% FLUSH
3.0000 mL | INTRAVENOUS | Status: DC | PRN
  Administered 2016-12-18: 18:00:00 10 mL via INTRAVENOUS
  Filled 2016-12-18: qty 3

## 2016-12-18 MED ORDER — TICAGRELOR 90 MG PO TABS
ORAL_TABLET | ORAL | Status: AC
  Filled 2016-12-18: qty 2

## 2016-12-18 MED ORDER — MIDAZOLAM HCL 2 MG/2ML IJ SOLN
INTRAMUSCULAR | Status: AC
  Filled 2016-12-18: qty 2

## 2016-12-18 SURGICAL SUPPLY — 23 items
BALLN MOZEC 1.5X12 (BALLOONS) ×2
BALLN MOZEC 3.0X9 (BALLOONS) ×2
BALLN ~~LOC~~ MOZEC 4.0X8 (BALLOONS) ×2
BALLOON MOZEC 1.5X12 (BALLOONS) IMPLANT
BALLOON MOZEC 3.0X9 (BALLOONS) IMPLANT
BALLOON ~~LOC~~ MOZEC 4.0X8 (BALLOONS) IMPLANT
CATH EXPO 5F IM (CATHETERS) ×1 IMPLANT
CATH INFINITI 5FR MULTPACK ANG (CATHETERS) ×1 IMPLANT
CATH SWAN GANZ 7F STRAIGHT (CATHETERS) ×1 IMPLANT
CATH VISTA GUIDE 6FR RCB 90 CM (CATHETERS) ×1 IMPLANT
KIT ENCORE 26 ADVANTAGE (KITS) ×1 IMPLANT
KIT HEART LEFT (KITS) ×2 IMPLANT
PACK CARDIAC CATHETERIZATION (CUSTOM PROCEDURE TRAY) ×2 IMPLANT
SHEATH PINNACLE 5F 10CM (SHEATH) ×1 IMPLANT
SHEATH PINNACLE 6F 10CM (SHEATH) ×1 IMPLANT
SHEATH PINNACLE 7F 10CM (SHEATH) ×1 IMPLANT
STENT RESOLUTE ONYX 4.0X12 (Permanent Stent) ×1 IMPLANT
TRANSDUCER W/STOPCOCK (MISCELLANEOUS) ×3 IMPLANT
TUBING CIL FLEX 10 FLL-RA (TUBING) ×2 IMPLANT
WIRE EMERALD 3MM-J .025X260CM (WIRE) ×1 IMPLANT
WIRE EMERALD 3MM-J .035X150CM (WIRE) ×1 IMPLANT
WIRE HI TORQ VERSACORE-J 145CM (WIRE) ×1 IMPLANT
WIRE RUNTHROUGH .014X180CM (WIRE) ×1 IMPLANT

## 2016-12-18 NOTE — Brief Op Note (Signed)
Brief Cardiac Catheterization Note (Full Report to Follow)  12/18/2016  9:27 AM  PATIENT:  Dave [name]  61 y.o. male  PRE-OPERATIVE DIAGNOSIS:  acute systolic heart failure, shortness of breath  POST-OPERATIVE DIAGNOSIS:  Same  PROCEDURE:  Procedure(s): Right/Left Heart Cath and Coronary/Graft Angiography (N/A) Coronary CTO Intervention (N/A)  SURGEON:  Surgeon(s) and Role:    * Yvonne Kendallhristopher Eryka Dolinger, MD - Primary  FINDINGS: 1. Significant 2 vessel CAD, including diffuse proximal LAD disease up to 70% and mid LAD occlusion, 70% ostial D1 stenosis, and chronic total occlusion of ostial RCA.  Non-obstructive LCx disease is also present. 2. Widely patent LIMA -> LAD. 3. Patent SVG -> rPDA with mild diffuse proximal/mid graft disease and 95% stenosis at distal anastomosis. 4. Chronically occluded SVG -> D1. 5. Mild to moderately elevated left and right heart filling pressures. 6. Moderate pulmonary hypertension. 7. Normal thermodilution and decreased Fick cardiac output. 8. Successful PCI to distal anastomosis of SVG->D1 with placement of a Resolute Onyx 4.0 x 12 mm stent.  RECOMMENDATIONS: 1. Admit for overnight observation. 2. DAPT with aspirin and ticagrelor for at least 6 months, ideally indefinitely. 3. Restart diuresis tomorrow. 4. Optimize evidence-based heart failure therapy, as tolerated.  Yvonne Kendallhristopher Mckynleigh Mussell, MD [facility] Pager: [phone]

## 2016-12-18 NOTE — Progress Notes (Signed)
Cardiology Post-Cath Note  Date: 12/18/16 Time: 5:34 PM  Subjective: Patient reports mild shortness of breath and orthopnea earlier this afternoon.  Reports feeling better now.  No chest pain.  Mild leg swelling bilaterally.  No right groin pain; sheath pull without incident.  Objective: Temp:  [97.8 F (36.6 C)-98 F (36.7 C)] 98 F (36.7 C) (01/11 1518) Pulse Rate:  [69-113] 79 (01/11 1600) Resp:  [10-29] 21 (01/11 1600) BP: (113-148)/(62-111) 132/87 (01/11 1600) SpO2:  [94 %-100 %] 99 % (01/11 1600) Weight:  [81.6 kg (180 lb)] 81.6 kg (180 lb) (01/11 0605) Gen: Seated comfortably on bed. Lungs: Mildly diminished breath sounds in both bases. CV: RRR without murmurs. Ext: Right groin clean and dry.  No bleeding or hematoma.  1+ pretibial edema bilaterally.  Assessment/Plan: 61 y/o man with CAD and ICM with severe LV dysfunction s/p PCI to SVG->PDA today.  He experienced mild shortness of breath and orthopnea this afternoon after cath.  Left heart pressures were mildly elevated during catheterization.  Though fluids were minimized, I suspect he has developed some pulmonary venous hypertension from his severe LV failure.  We will give furosemide 20 mg IV x 1 now and reevaluate in the AM regarding ongoing diuretic therapy.  If his renal function is stable, addition of low-dose ACEI or ARB should be considered.  Yvonne Kendallhristopher Estaban [place], MD [facility] Pager: [phone]

## 2016-12-18 NOTE — Progress Notes (Signed)
Site area: right groin  Site Prior to Removal:  Level 0  Pressure Applied For 20 MINUTES    Minutes Beginning at 1145  Manual:   Yes.    Patient Status During Pull:  stable  Post Pull Groin Site:  Level 0  Post Pull Instructions Given:  Yes.    Post Pull Pulses Present:  Yes.    Dressing Applied:  Yes.    Comments:  Arterial sheath pulled at 1145 and venous sheath pulled at 1150. Rechecked site frequently with no change in assessment. Ambulating after bedrest over and site remains unchanged.

## 2016-12-18 NOTE — H&P (View-Only) (Signed)
Clinical Summary Mr. Lax is a 61 y.o.male seen today for follow up of the following medical problems.     1. CAD: 06/2009 CABG x 3 (LIMA-LAD, SVG-diag, SVG-PDA). He presented with an anterior MI.  - from prior clinic notes, LVEF 40% (do not see echo report) at that time.  - no recent chest pain - recent SOB. Seen ER 3 times over the last 2 months. DOE chronic but can flare up. Exertion mainly limited by chronic hip pain, but SOB with short distance. No LE edema, no orthopnea - denies any coughing or wheezing.  - compliant with meds  - since last visit he completed an echo that showed LVEF 15%, diffuse hypokinesis. The report is available only in merge, I have contacted the echo staff about transitioning report to epic.  - since last visit admitted to [facility] admitted for about 6 days. Treated with diuretics and breathing treatments per his report, we are awaiting records. .   2. COPD - diagnosis mentioned in chart, however he is unaware of PFTs   3. NSVT - denies any recent palpitations.  Past Medical History:  Diagnosis Date  . Anxiety disorder   . Chest pain   . Chronic brain syndrome   . Elevated blood pressure   . Tobacco abuse      No Known Allergies   Current Outpatient Prescriptions  Medication Sig Dispense Refill  . albuterol (PROVENTIL) (2.5 MG/3ML) 0.083% nebulizer solution Inhale 1 mL into the lungs as needed.  99  . aspirin EC 81 MG tablet Take 81 mg by mouth daily.    Marland Kitchen atorvastatin (LIPITOR) 10 MG tablet Take 1 tablet by mouth daily.    . cefUROXime (CEFTIN) 250 MG tablet Take 1 tablet by mouth 2 (two) times daily.    . furosemide (LASIX) 20 MG tablet Take 1 tablet by mouth daily.    . methylPREDNISolone (MEDROL DOSEPAK) 4 MG TBPK tablet Take 1 tablet by mouth as directed. Dose pack    . Omega-3 Fatty Acids (FISH OIL) 1000 MG CPDR Take 1 capsule by mouth daily.    Marland Kitchen PROAIR HFA 108 (90 Base) MCG/ACT inhaler Inhale 2 puffs into the lungs 4 (four)  times daily as needed.    . vitamin B-12 (CYANOCOBALAMIN) 1000 MCG tablet Take 1,000 mcg by mouth daily.     No current facility-administered medications for this visit.      Past Surgical History:  Procedure Laterality Date  . CARDIAC CATHETERIZATION    . CHOLECYSTECTOMY     2007  . CORONARY ARTERY BYPASS GRAFT     X3     No Known Allergies    Family History  Problem Relation Age of Onset  . Coronary artery disease       Social History Mr. Sudberry reports that he has been smoking.  He has never used smokeless tobacco. Mr. Kehoe reports that he does not drink alcohol.   Review of Systems CONSTITUTIONAL: No weight loss, fever, chills, weakness or fatigue.  HEENT: Eyes: No visual loss, blurred vision, double vision or yellow sclerae.No hearing loss, sneezing, congestion, runny nose or sore throat.  SKIN: No rash or itching.  CARDIOVASCULAR: per HPI RESPIRATORY: per HPI GASTROINTESTINAL: No anorexia, nausea, vomiting or diarrhea. No abdominal pain or blood.  GENITOURINARY: No burning on urination, no polyuria NEUROLOGICAL: No headache, dizziness, syncope, paralysis, ataxia, numbness or tingling in the extremities. No change in bowel or bladder control.  MUSCULOSKELETAL: No muscle, back pain, joint  pain or stiffness.  LYMPHATICS: No enlarged nodes. No history of splenectomy.  PSYCHIATRIC: No history of depression or anxiety.  ENDOCRINOLOGIC: No reports of sweating, cold or heat intolerance. No polyuria or polydipsia.  Marland Kitchen.   Physical Examination Vitals:   12/16/16 1400  BP: 103/63  Pulse: 84   Vitals:   12/16/16 1400  Weight: 194 lb 12.8 oz (88.4 kg)  Height: 5\' 9"  (1.753 m)    Gen: resting comfortably, no acute distress HEENT: no scleral icterus, pupils equal round and reactive, no palptable cervical adenopathy,  CV: RRR, no m/r/g, no jvd Resp: Clear to auscultation bilaterally GI: abdomen is soft, non-tender, non-distended, normal bowel sounds, no  hepatosplenomegaly MSK: extremities are warm, no edema.  Skin: warm, no rash Neuro:  no focal deficits Psych: appropriate affect   Diagnostic Studies  06/2009 cath  IMPRESSION: 1. Severe global left ventricular dysfunction with an ejection fraction of approximately 25% with significant hypocontractility to akinesis involving an inferior wall hypocontractility anterolaterally and significant hypocontractility in the inferolateral wall. 2. Significant multivessel coronary obstructive disease with separate ostium giving rise to the left anterior descending and left circumflex vessels with diffuse 70% ostial proximal left anterior descending stenosis, 60% left anterior descending stenosis after the takeoff of the first septal and diagonal vessel and bifurcation 95% and 80% septal mid left anterior descending stenosis. 3. Large circumflex system with questionable ostial catheter spasm. 4. Total occlusion of the right coronary artery with extensive left-to- right collaterals. 5. An 80% left vertebral artery narrowing arising from the left circumflex system with evidence for plaque in the proximal subclavian system with a widely patent unbypassed left internal mammary artery. 6. 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  RECOMMENDATIONS: The patient will be evaluated for consideration of CABG revascularization surgery.       Assessment and Plan   1. CAD/ICM/Acute systolic HF - progressing SOB/DOE, no significant chest pain - recent echo shows LVEF 15% (report in merge only currently, to be transferred to Epic), a new finding for the patient - concern for recurrence of CAD given drop in LVEF and DOE. We will refer for LHC/RHC - start Toprol XL 12.5mg  daily. Increase atorva to 80mg  daily in setting of CAD. Start lasix 20mg  daily  prn swelling.  - if bp's tolerate low dose beta blocker, then start low dose ACE-I.       Antoine PocheJonathan F. Minh Jasper, M.D.

## 2016-12-18 NOTE — Progress Notes (Unsigned)
Also, ple

## 2016-12-18 NOTE — Progress Notes (Signed)
Bedrest up per RN Vernona RiegerLaura, drsg removed, bandaid applied, sat on side of bed but moderately SOB with that activity.  Pt states this level of SOB is "alot better than I was."  SOB resolved with rest, talkative without SOB.  Dr. Okey DupreEnd in to see pt, order received, IV Lasix given, Vernona RiegerLaura primary RN updated.  CHF materials given, discussed when to call MD and low sodium foods.  Pt eager to learn.  Does not have scale but states "I can get one." Discussed importance of daily weights.

## 2016-12-18 NOTE — Progress Notes (Signed)
S/W SHERITA @  [employer] [facility] # [phone] OPT- 2   BRILINTA 90 MG BID   COVER- YES  CO-PAY- $ 3.70 ( PATIENT HAS MEDICAID )  PRIOR APPROVAL - NO  PHARMACY : PREFER - [facility] AND [facility]

## 2016-12-18 NOTE — Care Management Note (Signed)
Case Management Note  Patient Details  Name: Brayton ElLarry D Nolde MRN: [id number] Date of Birth: 03/27/1956  Subjective/Objective:   S/p coronary stent intervention, will be on brilinta, NCM will cont to follow for dc needs.                  Action/Plan:   Expected Discharge Date:                  Expected Discharge Plan:  Home/Self Care  In-House Referral:     Discharge planning Services  CM Consult  Post Acute Care Choice:    Choice offered to:     DME Arranged:    DME Agency:     HH Arranged:    HH Agency:     Status of Service:  In process, will continue to follow  If discussed at Long Length of Stay Meetings, dates discussed:    Additional Comments:  Leone Havenaylor, Radie Berges [place], RN 12/18/2016, 12:39 PM

## 2016-12-18 NOTE — Interval H&P Note (Signed)
History and Physical Interval Note:  12/18/2016 7:14 AM  Dave [name]  has presented today for cardiac catheterization, with the diagnosis of systolic heart failure. The various methods of treatment have been discussed with the patient and family. After consideration of risks, benefits and other options for treatment, the patient has consented to  Procedure(s): Right/Left Heart Cath and Coronary/Graft Angiography (N/A) as a surgical intervention .  The patient's history has been reviewed, patient examined, no change in status, stable for surgery.  I have reviewed the patient's chart and labs.  Questions were answered to the patient's satisfaction.    Cath Lab Visit (complete for each Cath Lab visit)  Clinical Evaluation Leading to the Procedure:   ACS: No.  Non-ACS:    Anginal Classification: CCS III (shortness of breath)  Anti-ischemic medical therapy: Minimal Therapy (1 class of medications)  Non-Invasive Test Results: No non-invasive testing performed (Severe LV systolic dysfunction)  Prior CABG: Previous CABG  Dave [name]

## 2016-12-19 ENCOUNTER — Other Ambulatory Visit: Payer: Self-pay | Admitting: *Deleted

## 2016-12-19 DIAGNOSIS — F419 Anxiety disorder, unspecified: Secondary | ICD-10-CM | POA: Diagnosis not present

## 2016-12-19 DIAGNOSIS — I472 Ventricular tachycardia: Secondary | ICD-10-CM | POA: Diagnosis not present

## 2016-12-19 DIAGNOSIS — I5021 Acute systolic (congestive) heart failure: Secondary | ICD-10-CM | POA: Diagnosis not present

## 2016-12-19 DIAGNOSIS — I25810 Atherosclerosis of coronary artery bypass graft(s) without angina pectoris: Secondary | ICD-10-CM | POA: Diagnosis not present

## 2016-12-19 DIAGNOSIS — I110 Hypertensive heart disease with heart failure: Secondary | ICD-10-CM | POA: Diagnosis not present

## 2016-12-19 DIAGNOSIS — I2584 Coronary atherosclerosis due to calcified coronary lesion: Secondary | ICD-10-CM | POA: Diagnosis not present

## 2016-12-19 DIAGNOSIS — I272 Pulmonary hypertension, unspecified: Secondary | ICD-10-CM | POA: Diagnosis not present

## 2016-12-19 DIAGNOSIS — G8929 Other chronic pain: Secondary | ICD-10-CM | POA: Diagnosis not present

## 2016-12-19 DIAGNOSIS — I255 Ischemic cardiomyopathy: Secondary | ICD-10-CM | POA: Diagnosis not present

## 2016-12-19 DIAGNOSIS — J449 Chronic obstructive pulmonary disease, unspecified: Secondary | ICD-10-CM | POA: Diagnosis not present

## 2016-12-19 DIAGNOSIS — I5023 Acute on chronic systolic (congestive) heart failure: Secondary | ICD-10-CM | POA: Diagnosis not present

## 2016-12-19 DIAGNOSIS — I252 Old myocardial infarction: Secondary | ICD-10-CM | POA: Diagnosis not present

## 2016-12-19 DIAGNOSIS — I2582 Chronic total occlusion of coronary artery: Secondary | ICD-10-CM | POA: Diagnosis not present

## 2016-12-19 LAB — CBC
HCT: 44.9 % (ref 39.0–52.0)
HEMOGLOBIN: 14.6 g/dL (ref 13.0–17.0)
MCH: 29.3 pg (ref 26.0–34.0)
MCHC: 32.5 g/dL (ref 30.0–36.0)
MCV: 90.2 fL (ref 78.0–100.0)
Platelets: 249 10*3/uL (ref 150–400)
RBC: 4.98 MIL/uL (ref 4.22–5.81)
RDW: 16.1 % — AB (ref 11.5–15.5)
WBC: 15 10*3/uL — ABNORMAL HIGH (ref 4.0–10.5)

## 2016-12-19 LAB — BASIC METABOLIC PANEL
Anion gap: 8 (ref 5–15)
BUN: 25 mg/dL — AB (ref 6–20)
CALCIUM: 9 mg/dL (ref 8.9–10.3)
CO2: 31 mmol/L (ref 22–32)
CREATININE: 1.33 mg/dL — AB (ref 0.61–1.24)
Chloride: 99 mmol/L — ABNORMAL LOW (ref 101–111)
GFR calc Af Amer: >60 mL/min (ref >60)
GFR, EST NON AFRICAN AMERICAN: 57 mL/min — AB (ref >60)
GLUCOSE: 95 mg/dL (ref 65–99)
Potassium: 4.2 mmol/L (ref 3.5–5.1)
Sodium: 138 mmol/L (ref 135–145)

## 2016-12-19 MED ORDER — TICAGRELOR 90 MG PO TABS: 90.0000 mg | ORAL_TABLET | Freq: Two times a day (BID) | ORAL | 0 refills | Status: DC

## 2016-12-19 MED ORDER — TICAGRELOR 90 MG PO TABS: 90.0000 mg | ORAL_TABLET | Freq: Two times a day (BID) | ORAL | 10 refills | Status: DC

## 2016-12-19 MED ORDER — TICAGRELOR 90 MG PO TABS: 90.0000 mg | ORAL_TABLET | Freq: Two times a day (BID) | ORAL | 3 refills | Status: DC

## 2016-12-19 MED ORDER — NITROGLYCERIN 0.4 MG SL SUBL: 0.4000 mg | SUBLINGUAL_TABLET | SUBLINGUAL | Status: DC | PRN

## 2016-12-19 MED ORDER — METOPROLOL SUCCINATE ER 25 MG PO TB24: 25.0000 mg | ORAL_TABLET | Freq: Every day | ORAL | 5 refills | Status: DC

## 2016-12-19 MED ORDER — LISINOPRIL 2.5 MG PO TABS: 2.5000 mg | ORAL_TABLET | Freq: Every day | ORAL | 5 refills | Status: DC

## 2016-12-19 MED ORDER — METOPROLOL SUCCINATE ER 25 MG PO TB24
25.0000 mg | ORAL_TABLET | Freq: Every day | ORAL | Status: DC
  Administered 2016-12-19: 25 mg via ORAL
  Filled 2016-12-19: qty 1

## 2016-12-19 MED ORDER — NITROGLYCERIN 0.4 MG SL SUBL: 0.4000 mg | SUBLINGUAL_TABLET | SUBLINGUAL | 2 refills | Status: DC | PRN

## 2016-12-19 NOTE — Discharge Summary (Signed)
Discharge Summary    Patient ID: Dave [name],  MRN: [id number], DOB/AGE: 08-20-1956 61 y.o.  Admit date: 12/18/2016 Discharge date: 12/19/2016  Primary Care Provider: Ignatius Specking Primary Cardiologist: Dr. Wyline Mood   Discharge Diagnoses    Principal Problem:   Cardiomyopathy [facility]) Active Problems:   Shortness of breath   Acute systolic heart failure (HCC)   Allergies No Known Allergies  Diagnostic Studies/Procedures    Procedures   Coronary Stent Intervention  Right/Left Heart Cath and Coronary/Graft Angiography  Conclusion   Conclusions: 1. Significant 2-vessel native coronary artery disease, including moderate diffuse proximal LAD disease and chronic total occlusion of mid LAD and ostial RCA. 70% proximal D1 stenosis is also present. 2. Moderate, non-obstructive disease involving LCx. Small branch of OM2 is chronically occluded. 3. Widely patent LIMA->LAD. 4. Patent SVG->rPDA with mild diffuse disease and discrete 95% stenosis at the distal anastomosis. 5. Chronically occluded SVG->D1. 6. Mildly elevated left and right heart filling pressures, as well as moderate pulmonary hypertension. 7. Normal to mildly reduced cardiac output. 8. Successful PCI to distal SVG->rPDA anastomosis using a Resolute Onxy 4.0 x 12 mm drug-eluting stent with 0% residual stenosis and TIMI-3 flow.      History of Present Illness     61 y/o male with h/o CAD and ischemic cardiomyopathy, s/p CABG x 3 in 2010 (LIMA-LAD, SVG-diag, SVG-PDA) and previous EF of 40%, admitted for LHC in the setting of worsening systolic HF with new EF of 15% on recent echo.   Hospital Course     LHC was performed on 12/18/16 per Dr. Okey Dupre. Cath showed patent LIMA-LAD, chronically occluded SVG-D1 and patent SVG- rPDA with mild diffuse disease and discrete 95% stenosis at the distal anastomosis, s/p successful PCI to distal SVG->rPDA anastomosis using a Resolute Onxy 4.0 x 12 mm drug-eluting stent with  0% residual stenosis and TIMI-3 flow. He tolerated the procedure well and left the cath lab in stable condition. He denied any recurrent CP. He was placed on DAPT with ASA + Brilinta, high dose statin, BB and low dose ACE-I. He was also  continued on his home lasix. He was noted to have PVCs on telemetry and his metoprolol was increased to 25 mg BID. He had no post cath complications. Renal function and cath site remained stable. No difficulties ambulating with cardiac rehab. Volume status was stable and well as vital signs. He was last seen and examined by Dr. Antoine Poche, who determined he was stable for discharge home. He has post hospital f/u arranged with Dr. Wyline Mood in [place] on 12/31/16. He will need a repeat BMP.   None    Discharge Vitals Blood pressure 132/72, pulse 91, temperature 98.1 F (36.7 C), temperature source Oral, resp. rate 19, height 5\' 9"  (1.753 m), weight 191 lb 5.8 oz (86.8 kg), SpO2 95 %.  Filed Weights   12/18/16 0605 12/19/16 0429  Weight: 180 lb (81.6 kg) 191 lb 5.8 oz (86.8 kg)    Labs & Radiologic Studies    CBC  Recent Labs  12/19/16 0212  WBC 15.0*  HGB 14.6  HCT 44.9  MCV 90.2  PLT 249   Basic Metabolic Panel  Recent Labs  12/19/16 0212  NA 138  K 4.2  CL 99*  CO2 31  GLUCOSE 95  BUN 25*  CREATININE 1.33*  CALCIUM 9.0   Liver Function Tests No results for input(s): AST, ALT, ALKPHOS, BILITOT, PROT, ALBUMIN in the last 72 hours. No results for  input(s): LIPASE, AMYLASE in the last 72 hours. Cardiac Enzymes No results for input(s): CKTOTAL, CKMB, CKMBINDEX, TROPONINI in the last 72 hours. BNP Invalid input(s): POCBNP D-Dimer No results for input(s): DDIMER in the last 72 hours. Hemoglobin A1C No results for input(s): HGBA1C in the last 72 hours. Fasting Lipid Panel No results for input(s): CHOL, HDL, LDLCALC, TRIG, CHOLHDL, LDLDIRECT in the last 72 hours. Thyroid Function Tests No results for input(s): TSH, T4TOTAL, T3FREE, THYROIDAB in  the last 72 hours.  Invalid input(s): FREET3 _____________  No results found. Disposition   Pt is being discharged home today in good condition.  Follow-up Plans & Appointments    Follow-up Information    Dina RichBranch, Jonathan, MD Follow up on 12/31/2016.   Specialty:  Cardiology Why:  3:40 pm  Contact information: [address] [place] [id number] [phone]          Discharge Instructions    (HEART FAILURE PATIENTS) Call MD:  Anytime you have any of the following symptoms: 1) 3 pound weight gain in 24 hours or 5 pounds in 1 week 2) shortness of breath, with or without a dry hacking cough 3) swelling in the hands, feet or stomach 4) if you have to sleep on extra pillows at night in order to breathe.    Complete by:  As directed    Amb Referral to Cardiac Rehabilitation    Complete by:  As directed    Diagnosis:  Coronary Stents   Diet - low sodium heart healthy    Complete by:  As directed    Increase activity slowly    Complete by:  As directed       Discharge Medications   Discharge Medication List as of 12/19/2016 10:52 AM    START taking these medications   Details  lisinopril (PRINIVIL,ZESTRIL) 2.5 MG tablet Take 1 tablet (2.5 mg total) by mouth daily., Starting Fri 12/19/2016, Normal    nitroGLYCERIN (NITROSTAT) 0.4 MG SL tablet Place 1 tablet (0.4 mg total) under the tongue every 5 (five) minutes as needed for chest pain., Starting Fri 12/19/2016, Normal    !! ticagrelor (BRILINTA) 90 MG TABS tablet Take 1 tablet (90 mg total) by mouth 2 (two) times daily., Starting Fri 12/19/2016, Normal    !! ticagrelor (BRILINTA) 90 MG TABS tablet Take 1 tablet (90 mg total) by mouth 2 (two) times daily., Starting Fri 12/19/2016, Normal     !! - Potential duplicate medications found. Please discuss with provider.    CONTINUE these medications which have CHANGED   Details  metoprolol succinate (TOPROL XL) 25 MG 24 hr tablet Take 1 tablet (25 mg total) by mouth  daily., Starting Fri 12/19/2016, Normal      CONTINUE these medications which have NOT CHANGED   Details  albuterol (PROVENTIL) (2.5 MG/3ML) 0.083% nebulizer solution Inhale 2.5 mg into the lungs every 4 (four) hours as needed for wheezing or shortness of breath. , Starting Sat 11/08/2016, Historical Med    aspirin EC 81 MG tablet Take 81 mg by mouth daily., Historical Med    atorvastatin (LIPITOR) 80 MG tablet Take 1 tablet (80 mg total) by mouth daily., Starting Tue 12/16/2016, Normal    furosemide (LASIX) 20 MG tablet TAKE 1 TAB DAILY AS NEEDED FOR SWELLING, Normal    methylPREDNISolone (MEDROL DOSEPAK) 4 MG TBPK tablet Take 4-24 tablets by mouth as directed. Dose pack , Starting Mon 11/10/2016, Historical Med    PROAIR HFA 108 (90 Base) MCG/ACT  inhaler Inhale 2 puffs into the lungs 4 (four) times daily as needed for shortness of breath. , Starting Sat 11/08/2016, Historical Med         Aspirin prescribed at discharge?  Yes High Intensity Statin Prescribed? (Lipitor 40-80mg  or Crestor 20-40mg ): Yes Beta Blocker Prescribed? Yes For EF <40%, was ACEI/ARB Prescribed? Yes ADP Receptor Inhibitor Prescribed? (i.e. Plavix etc.-Includes Medically Managed Patients): Yes For EF <40%, Aldosterone Inhibitor Prescribed? No: soft BP, consider outpatient initiation  Was EF assessed during THIS hospitalization? Yes Was Cardiac Rehab II ordered? (Included Medically managed Patients): Yes   Outstanding Labs/Studies   F/u BMP at post hospital f/u  Duration of Discharge Encounter   Greater than 30 minutes including physician time.  Signed, Robbie Lis PA-C 12/19/2016, 2:30 PM

## 2016-12-19 NOTE — Progress Notes (Signed)
Patient Name: Dave [name] Date of Encounter: 12/19/2016  Primary Cardiologist: Dr. Yvonne KendallBranch   Hospital Problem List     Principal Problem:   Cardiomyopathy [facility](HCC) Active Problems:   Shortness of breath   Acute systolic heart failure [facility](HCC)    Patient Profile     61 y/o male with h/o CAD and ischemic cardiomyopathy, s/p CABG x 3 in 2010 (LIMA-LAD, SVG-diag, SVG-PDA) and previous EF of 40%, admitted for LHC in the setting of worsening systolic HF with new EF of 15% on recent echo.    Subjective   He denies CP. He notes mild orthopnea and mild dyspnea with exertion.  Ambulating ok. He denies groin pain.   Inpatient Medications    Scheduled Meds: . aspirin EC  81 mg Oral Daily  . atorvastatin  80 mg Oral Daily  . furosemide  20 mg Oral Daily  . metoprolol succinate  12.5 mg Oral Daily  . sodium chloride flush  3 mL Intravenous Q12H  . sodium chloride flush  3 mL Intravenous Q12H  . ticagrelor  90 mg Oral BID   Continuous Infusions:  PRN Meds: sodium chloride, sodium chloride, acetaminophen, albuterol, ondansetron (ZOFRAN) IV, sodium chloride flush, sodium chloride flush   Vital Signs    Vitals:   12/18/16 1921 12/18/16 2000 12/19/16 0429 12/19/16 0731  BP: 120/65  (!) 103/57 132/72  Pulse: 80  73 91  Resp: (!) 25 20 19    Temp: 97.3 F (36.3 C)  98.2 F (36.8 C) 98.1 F (36.7 C)  TempSrc: Oral  Axillary Oral  SpO2: 95%  95% 95%  Weight:   191 lb 5.8 oz (86.8 kg)   Height:        Intake/Output Summary (Last 24 hours) at 12/19/16 0801 Last data filed at 12/19/16 0734  Gross per 24 hour  Intake           931.83 ml  Output             4650 ml  Net         -3718.17 ml   Filed Weights   12/18/16 0605 12/19/16 0429  Weight: 180 lb (81.6 kg) 191 lb 5.8 oz (86.8 kg)    Physical Exam   GEN: Well nourished, well developed, in no acute distress.  HEENT: Grossly normal.  Neck: Supple, no JVD, carotid bruits, or masses. Cardiac: RRR, no murmurs, rubs, or  gallops. No clubbing, cyanosis, edema.  Radials/DP/PT 2+ and equal bilaterally.  Respiratory:  Respirations regular and unlabored, clear to auscultation bilaterally. GI: Soft, nontender, nondistended, BS + x 4. MS: no deformity or atrophy. Skin: warm and dry, no rash. Neuro:  Strength and sensation are intact. Psych: AAOx3.  Normal affect.  Labs    CBC  Recent Labs  12/19/16 0212  WBC 15.0*  HGB 14.6  HCT 44.9  MCV 90.2  PLT 249   Basic Metabolic Panel  Recent Labs  12/19/16 0212  NA 138  K 4.2  CL 99*  CO2 31  GLUCOSE 95  BUN 25*  CREATININE 1.33*  CALCIUM 9.0   Liver Function Tests No results for input(s): AST, ALT, ALKPHOS, BILITOT, PROT, ALBUMIN in the last 72 hours. No results for input(s): LIPASE, AMYLASE in the last 72 hours. Cardiac Enzymes No results for input(s): CKTOTAL, CKMB, CKMBINDEX, TROPONINI in the last 72 hours. BNP Invalid input(s): POCBNP D-Dimer No results for input(s): DDIMER in the last 72 hours. Hemoglobin A1C No results for input(s): HGBA1C in the  last 72 hours. Fasting Lipid Panel No results for input(s): CHOL, HDL, LDLCALC, TRIG, CHOLHDL, LDLDIRECT in the last 72 hours. Thyroid Function Tests No results for input(s): TSH, T4TOTAL, T3FREE, THYROIDAB in the last 72 hours.  Invalid input(s): FREET3  Telemetry    NSRVT 13 beat run- Personally Reviewed SR with PVCs   Radiology    No results found.  Cardiac Studies    Procedures   Coronary Stent Intervention  Right/Left Heart Cath and Coronary/Graft Angiography  Conclusion   Conclusions: 1.  Significant 2-vessel native coronary artery disease, including moderate diffuse proximal LAD disease and chronic total occlusion of mid LAD and ostial RCA.  70% proximal D1 stenosis is also present. 2.  Moderate, non-obstructive disease involving LCx.  Small branch of OM2 is chronically occluded. 3.  Widely patent LIMA->LAD. 4.  Patent SVG->rPDA with mild diffuse disease and discrete  95% stenosis at the distal anastomosis. 5.  Chronically occluded SVG->D1. 6.  Mildly elevated left and right heart filling pressures, as well as moderate pulmonary hypertension. 7.  Normal to mildly reduced cardiac output. 8.  Successful PCI to distal SVG->rPDA anastomosis using a Resolute Onxy 4.0 x 12 mm drug-eluting stent with 0% residual stenosis and TIMI-3 flow.    Patient Profile     61 y/o male with h/o CAD and ischemic cardiomyopathy, s/p CABG x 3 in 2010 (LIMA-LAD, SVG-diag, SVG-PDA) and previous EF of 40%, admitted for LHC in the setting of worsening systolic HF with new EF of 15% on recent echo.   Assessment & Plan    1. CAD: s/p CABG x 3 in 2010 (LIMA-LAD, SVG-diag, SVG-PDA).  Now with worsening systolic HF with drop in EF from 40>>15%. Subsequent LHC yesterday showed patent LIMA-LAD, chronically occluded SVG-D1 and patent SVG- rPDA with mild diffuse disease and discrete 95% stenosis at the distal anastomosis, s/p successful PCI to distal SVG->rPDA anastomosis using a Resolute Onxy 4.0 x 12 mm drug-eluting stent with 0% residual stenosis and TIMI-3 flow. He denies CP. No post cath complications. Continue DAPT with ASA + Brilinta. Continue high dose statin and BB. SCr today is 1.33 (basline is 1.1). Recommend initiation of an ACE/ARB for systolic HF, with repeat office BMP in 1 week.   2. Ischemic Cardiomyopathy/ Chronic Systolic HF: EF reduced from 40%>>15%. S/p PCI to SVG-rPDA 12/18/16. Continue medical therapy with BB, metoprolol XL. He would benefit from initiation of an ACE/ARB. Scr is 1.33 (baseline is 1.1). Start today and repeat BMP in 1 week in clinic. Continue Lasix. Low sodium diet.   3. NSVT: 13 beat run noted on telemetry. Frequent PVCs noted to telemetry. He has been asymptomatic.  Continue BB therapy with metoprolol XL. He is on a low dose, 12.5 mg daily. Increase to 25 mg daily.     Signed, Robbie Lis, PA-C  12/19/2016, 8:01 AM    History and all data above  reviewed.  Patient examined.  I agree with the findings as above. He is SOB but this is baseline and his sats were 97% with ambulation.  No chest pain.  The patient exam reveals COR:RRR  ,  Lungs: Clear  ,  Abd: Positive bowel sounds, no rebound no guarding, Ext No edema  .  All available labs, radiology testing, previous records reviewed. Agree with documented assessment and plan. CAD:  OK to go home.  His breathing at baseline.  He will need TOC follow up next week with repeat BMET.  Told to take Lasix daily.  It has  been PRN.  He will need continued med titration.    Rollene Rotunda  8:48 AM  12/19/2016

## 2016-12-19 NOTE — Care Management Note (Signed)
Case Management Note  Patient Details  Name: Dave [name] MRN: [id number] Date of Birth: 02/19/1956  Subjective/Objective:   S/p coronary stent intervention, will be on brilinta,  Co pay is 3.70, He is indep pta, [facility] [place] and they do have brilinta in stock.  For dc today, no other needs.                 Action/Plan:   Expected Discharge Date:  12/19/16               Expected Discharge Plan:  Home/Self Care  In-House Referral:     Discharge planning Services  CM Consult  Post Acute Care Choice:    Choice offered to:     DME Arranged:    DME Agency:     HH Arranged:    HH Agency:     Status of Service:  Completed, signed off  If discussed at [employer] Length of Stay Meetings, dates discussed:    Additional Comments:  Leone Havenaylor, Amee Boothe [place], RN 12/19/2016, 10:44 AM

## 2016-12-19 NOTE — Telephone Encounter (Signed)
HAD TO RESEND THE BRILINTA 90MG  DUE TO IT BEING SENT TO [facility]STEAD OF [facility], RESENT TO [facility] AS REQUESTED

## 2016-12-19 NOTE — Progress Notes (Signed)
CARDIAC REHAB PHASE I   PRE:  Rate/Rhythm: 92 SR c/ PVCs  BP:  Sitting: 132/72        SaO2: 96 RA  MODE:  Ambulation: 420 ft   POST:  Rate/Rhythm: 93 SR c/ PVCs  BP:  Sitting: 124/87         SaO2: 97 RA  Pt ambulated 420 ft on RA, independent, steady gait, tolerated fairly well.  Pt c/o moderate DOE, denies cp, dizziness, declined rest stop. Completed PCI/stent/HR education.  Reviewed risk factors, tobacco cessation (pt states he is not sure if he is ready to quit, declined fake cigarette), PCI book, anti-platelet therapy, stent card, activity restrictions, ntg, exercise, heart healthy diet, CHF booklet and zone tool, daily weights, sodium recommendations, and phase 2 cardiac rehab. Pt verbalized understanding, needs reinforcement as able. Pt agrees to phase 2 cardiac rehab referral, will send to [facility] per pt request. Pt to edge of bed per pt request after walk, call bell within reach.   [phone] Dave GrapesEmily C Nikeria Kalman, RN, BSN 12/19/2016 8:38 AM

## 2016-12-22 DIAGNOSIS — I472 Ventricular tachycardia: Secondary | ICD-10-CM | POA: Diagnosis not present

## 2016-12-22 DIAGNOSIS — I429 Cardiomyopathy, unspecified: Secondary | ICD-10-CM | POA: Diagnosis not present

## 2016-12-22 DIAGNOSIS — E663 Overweight: Secondary | ICD-10-CM | POA: Diagnosis not present

## 2016-12-22 DIAGNOSIS — Z299 Encounter for prophylactic measures, unspecified: Secondary | ICD-10-CM | POA: Diagnosis not present

## 2016-12-22 DIAGNOSIS — J441 Chronic obstructive pulmonary disease with (acute) exacerbation: Secondary | ICD-10-CM | POA: Diagnosis not present

## 2016-12-22 DIAGNOSIS — I25119 Atherosclerotic heart disease of native coronary artery with unspecified angina pectoris: Secondary | ICD-10-CM | POA: Diagnosis not present

## 2016-12-22 LAB — POCT I-STAT 3, VENOUS BLOOD GAS (G3P V)
Acid-Base Excess: 5 mmol/L — ABNORMAL HIGH (ref 0.0–2.0)
BICARBONATE: 31.9 mmol/L — AB (ref 20.0–28.0)
O2 Saturation: 63 %
PCO2 VEN: 55.1 mmHg (ref 44.0–60.0)
TCO2: 34 mmol/L (ref 0–100)
pH, Ven: 7.371 (ref 7.250–7.430)
pO2, Ven: 34 mmHg (ref 32.0–45.0)

## 2016-12-23 MED FILL — Lidocaine HCl Local Preservative Free (PF) Inj 1%: INTRAMUSCULAR | Qty: 30 | Status: AC

## 2016-12-25 ENCOUNTER — Other Ambulatory Visit: Payer: Self-pay | Admitting: Cardiology

## 2016-12-26 ENCOUNTER — Ambulatory Visit: Admit: 2016-12-26 | Payer: PRIVATE HEALTH INSURANCE | Attending: Family Medicine | Primary: Family Medicine

## 2016-12-26 ENCOUNTER — Encounter: Admit: 2016-12-26 | Primary: Family Medicine

## 2016-12-26 ENCOUNTER — Encounter: Admit: 2016-12-26 | Discharge: 2016-12-26 | Payer: PRIVATE HEALTH INSURANCE | Primary: Family Medicine

## 2016-12-26 DIAGNOSIS — Z0000 Encounter for general adult medical examination without abnormal findings: Secondary | ICD-10-CM

## 2016-12-26 MED ORDER — VARENICLINE 0.5 MG (11)-1 MG (3X14) TABS IN A DOSE PACK
0.5 mg (11)- 1 mg (42) | ORAL | 0 refills | Status: DC
Start: 2016-12-26 — End: 2017-04-17

## 2016-12-26 NOTE — Progress Notes (Signed)
Images obtained

## 2016-12-26 NOTE — Progress Notes (Signed)
SUBJECTIVE:   Anthony [name] is a 61 y.o. male who is here for complete physical exam.    Pt reports he had a nice holiday.     HTN: Pt is compliant in taking metoprolol-succinate. Patient denies chest pain, edema, headache, dizziness, palpitations or syncope. Pt's BP in the office today was normal at 122/75. His weight the office today is 255lbs, up 1lb since 03/07/2016.     HLD: Pt is compliant in taking rosuvastatin. Patient denies abdominal pain, nausea, vomiting, diarrhea, arthralgias/myalgias due to the medication.    Tobacco Abuse: Pt reports he smokes about 2ppd. He is having some intermittent SOB and productive cough (yellowish sputum). He has had increased cough over the past two weeks. He had a chest x-ray done at [facility] in December 2017. Pt inquired if his insurance will cover Chantix.    -Pt mentions he has noticed his vision has been blurred in the morning.  -Pt has had tinnitus for 5 years and reports it is constant and getting worse.  -Pt believes he may have some weakening in his urine stream.  -Pt has some numbness in his left foot and leg. He believes it is residual from a back injury.     -Pt has an MRI scheduled on 12/30/2015 for the C5 vertebrae. He has pain down his arm.   -Pt sees a dermatologist next week regarding a spot on his left check.     PREVENTIVE:  Colonoscopy: current   Flu: current         Ophthalmology: due [facility], pt will schedule)    Pt specifically denies changes in hearing, trouble with swallowing or taste, CP, heartburn or upset stomach, or change in bowel habits.    At this time, he is otherwise doing well and has brought no other complaints to my attention today. For a list of the medical issues addressed today, see the assessment and plan below.      PMH:   Past Medical History:   Diagnosis Date   ??? CAD (coronary artery disease)     heart attack 2011   ??? Chronic obstructive pulmonary disease (HCC)    ??? Hypercholesterolemia     ??? Hypertension        PSH:  has a past surgical history that includes pr cardiac surg procedure unlist.    Allergies: has No Known Allergies.    Meds:   Current Outpatient Prescriptions   Medication Sig   ??? varenicline (CHANTIX STARTER PAK) 0.5 mg (11)- 1 mg (42) DsPk Take as directed   ??? metoprolol succinate (TOPROL-XL) 50 mg XL tablet Take 50 mg by mouth daily.   ??? rosuvastatin (CRESTOR) 10 mg tablet Take 10 mg by mouth nightly.   ??? omega-3 fatty acids-vitamin e 1,000 mg cap Take 1 Cap by mouth daily.   ??? aspirin delayed-release 81 mg tablet Take 81 mg by mouth daily.   ??? multivitamin (ONE A DAY) tablet Take 1 Tab by mouth daily.   ??? glucosamine-chondroitin (ARTHX) 500-400 mg cap Take 1 Cap by mouth daily.     No current facility-administered medications for this visit.        Fam hx: family history includes Diabetes in his brother; Heart Disease in his father; Hypertension in his mother.    Soc hx:  reports that he has been smoking.  He has a 40.00 pack-year smoking history. He has never used smokeless tobacco. He reports that he does not drink alcohol or use  illicit drugs.      Review of Systems - History obtained from the patient  General ROS: negative  Psychological ROS: negative  Ophthalmic ROS: blurred vision   ENT ROS: tinnitus   Respiratory ROS: SOB, productive cough (yellowish sputum)   Cardiovascular ROS: no chest pain or dyspnea on exertion  Gastrointestinal ROS: no abdominal pain, change in bowel habits, or black or bloody stools  Genito-Urinary ROS: weakened urine stream   Musculoskeletal ROS: negative  Neurological ROS: numbness in leg and foot   Dermatological ROS: negative    OBJECTIVE:   Vitals:   Visit Vitals   ??? BP 122/75 (BP 1 Location: Left arm, BP Patient Position: Sitting)   ??? Pulse 71   ??? Temp 98.1 ??F (36.7 ??C) (Oral)   ??? Resp 18   ??? Ht 5' 10.5" (1.791 m)   ??? Wt 255 lb (115.7 kg)   ??? SpO2 95%   ??? BMI 36.07 kg/m2     Gen: Pleasant 61 y.o. male in NAD.    HEENT: PERRLA. EOMI. OP moist and pink.    EARS: TMs normal and canals equal bilaterally.    NECK: Supple.  No LAD. No thyromegaly.    HEART: RRR, No M/G/R.     LUNGS: CTAB No W/R. Decreased breath sounds in right lower lobe, otherwise clear   ABDOMEN: S, NT, ND, BS+.     EXTREMITIES: Warm. No C/C/E.    MUSCULOSKELETAL: Normal ROM, muscle strength 5/5 all groups.    NEURO: Alert and oriented x 3.  Cranial nerves grossly intact.  No focal sensory or motor deficits noted. SKIN: Warm. Dry. No rashes or other lesions noted.  Male GU: Normal external genitalia, no penile discharge, no lesions, no masses, no hernias    ASSESSMENT/ PLAN:     Diagnoses and all orders for this visit:    1. Annual physical exam  -     LIPID PANEL  -     METABOLIC PANEL, COMPREHENSIVE  -     HEMOGLOBIN A1C WITH EAG  -     CBC W/O DIFF  -     T4, FREE  -     TSH 3RD GENERATION  -     PROSTATE SPECIFIC AG  -     MICROALBUMIN, UR, RAND W/ MICROALBUMIN/CREA RATIO  -     PULMONARY FUNCTION TEST; Future  -     [facility]  -     varenicline (CHANTIX STARTER PAK) 0.5 mg (11)- 1 mg (42) DsPk; Take as directed  -     XR CHEST PA LAT; Future        1. Annual Physical Exam   Anthony [name]'s physical exam was normal. Pt was given lab orders for a CBC, CMP, lipid panel, T4, TSH, HgA1c, PSA, and microalbumin to have done when he is fasting. Since pt expressed interest in trying Chantix to help him quit smoking, I prescribed Chantix. I also ordered a pulmonary function test to assess his breathing. I encouraged pt to get a chest x-ray to further evaluate the cause of his cough.  In regards to his weakened urinary stream, I recommended pt follow up with a urologist (referral given).     Follow-up Disposition:  Return in about 3 months (around 03/26/2017) for smoking cessation.    I have reviewed the patient's medications and risks/side effects/benefits were discussed. Diagnosis(-es) explained to patient and questions  answered. Literature provided where appropriate.     Written  by Magda Kiel, [place], as dictated by Jackolyn Confer, MD.

## 2016-12-30 ENCOUNTER — Encounter: Payer: Self-pay | Admitting: *Deleted

## 2016-12-31 ENCOUNTER — Ambulatory Visit (INDEPENDENT_AMBULATORY_CARE_PROVIDER_SITE_OTHER): Payer: Medicare Other | Admitting: Cardiology

## 2016-12-31 ENCOUNTER — Encounter: Payer: Self-pay | Admitting: Cardiology

## 2016-12-31 VITALS — BP 102/67 | HR 81 | Ht 69.0 in | Wt 181.4 lb

## 2016-12-31 DIAGNOSIS — I5022 Chronic systolic (congestive) heart failure: Secondary | ICD-10-CM

## 2016-12-31 DIAGNOSIS — I255 Ischemic cardiomyopathy: Secondary | ICD-10-CM

## 2016-12-31 DIAGNOSIS — I2510 Atherosclerotic heart disease of native coronary artery without angina pectoris: Secondary | ICD-10-CM

## 2016-12-31 NOTE — Progress Notes (Signed)
Clinical Summary Dave [name] is a 61 y.o.male seen today for follow up of the following medical problems.   1. CAD/Chronic systolic HF -  06/2009 CABG x 3 (LIMA-LAD, SVG-diag, SVG-PDA). He presented with an anterior MI.  - from prior clinic notes, LVEF 40% (do not see echo report) at that time.  - recent issues with severe SOB and DOE -11/2016 echo that showed LVEF 15%, diffuse hypokinesis.  - Jan 2018 cath as reported below, received DES to distal SVG-rPDA.  RHC with CI 2, mean PA 39, PCWP 29, LVEDP 22 -  Discharge weight 191 lbs. Today weight is 181 lbs.  - not interested in cardiac rehab.    - no recent chest pain. Breathing is much improved. - weight down 13 lbs. Now taking lasix just prn. Goal weight 180-185.   2. COPD - diagnosis mentioned in chart, however he is unaware of PFTs in the past   3. NSVT - denies any recent palpitations since last visit   Past Medical History:  Diagnosis Date  . Anterior myocardial infarction (HCC) 06/2009   [name] 12/16/2016  . Anxiety   . Anxiety disorder   . Chest pain   . Chronic brain syndrome   . Elevated blood pressure   . Tobacco abuse      No Known Allergies   Current Outpatient Prescriptions  Medication Sig Dispense Refill  . albuterol (PROVENTIL) (2.5 MG/3ML) 0.083% nebulizer solution Inhale 2.5 mg into the lungs every 4 (four) hours as needed for wheezing or shortness of breath.   99  . aspirin EC 81 MG tablet Take 81 mg by mouth daily.    Marland Kitchen atorvastatin (LIPITOR) 80 MG tablet Take 1 tablet (80 mg total) by mouth daily. 90 tablet 3  . furosemide (LASIX) 20 MG tablet TAKE 1 TAB DAILY AS NEEDED FOR SWELLING 90 tablet 3  . lisinopril (PRINIVIL,ZESTRIL) 2.5 MG tablet Take 1 tablet (2.5 mg total) by mouth daily. 30 tablet 5  . methylPREDNISolone (MEDROL DOSEPAK) 4 MG TBPK tablet Take 4-24 tablets by mouth as directed. Dose pack     . metoprolol succinate (TOPROL XL) 25 MG 24 hr tablet Take 1 tablet (25 mg total) by  mouth daily. 30 tablet 5  . nitroGLYCERIN (NITROSTAT) 0.4 MG SL tablet Place 1 tablet (0.4 mg total) under the tongue every 5 (five) minutes as needed for chest pain. 25 tablet 2  . PROAIR HFA 108 (90 Base) MCG/ACT inhaler Inhale 2 puffs into the lungs 4 (four) times daily as needed for shortness of breath.     . ticagrelor (BRILINTA) 90 MG TABS tablet Take 1 tablet (90 mg total) by mouth 2 (two) times daily. 60 tablet 10  . ticagrelor (BRILINTA) 90 MG TABS tablet Take 1 tablet (90 mg total) by mouth 2 (two) times daily. 180 tablet 3   No current facility-administered medications for this visit.      Past Surgical History:  Procedure Laterality Date  . CARDIAC CATHETERIZATION  06/2009   [name] 12/16/2016  . CARDIAC CATHETERIZATION N/A 12/18/2016   Procedure: Right/Left Heart Cath and Coronary/Graft Angiography;  Surgeon: Yvonne Kendall, MD;  Location: [facility]VASIVE CV LAB;  Service: Cardiovascular;  Laterality: N/A;  . CARDIAC CATHETERIZATION N/A 12/18/2016   Procedure: Coronary Stent Intervention;  Surgeon: Yvonne Kendall, MD;  Location: [facility] INVASIVE CV LAB;  Service: Cardiovascular;  Laterality: N/A;  SVG-PDA  . CORONARY ANGIOPLASTY    . CORONARY ARTERY BYPASS GRAFT  2010   X3  .  LAPAROSCOPIC CHOLECYSTECTOMY  2007     No Known Allergies    Family History  Problem Relation Age of Onset  . Coronary artery disease       Social History Dave [name] reports that he has been smoking.  He has a 11.00 pack-year smoking history. He has never used smokeless tobacco. Dave [name] reports that he does not drink alcohol.   Review of Systems CONSTITUTIONAL: No weight loss, fever, chills, weakness or fatigue.  HEENT: Eyes: No visual loss, blurred vision, double vision or yellow sclerae.No hearing loss, sneezing, congestion, runny nose or sore throat.  SKIN: No rash or itching.  CARDIOVASCULAR: per HPI RESPIRATORY: No shortness of breath, cough or sputum.  GASTROINTESTINAL: No anorexia,  nausea, vomiting or diarrhea. No abdominal pain or blood.  GENITOURINARY: No burning on urination, no polyuria NEUROLOGICAL: No headache, dizziness, syncope, paralysis, ataxia, numbness or tingling in the extremities. No change in bowel or bladder control.  MUSCULOSKELETAL: No muscle, back pain, joint pain or stiffness.  LYMPHATICS: No enlarged nodes. No history of splenectomy.  PSYCHIATRIC: No history of depression or anxiety.  ENDOCRINOLOGIC: No reports of sweating, cold or heat intolerance. No polyuria or polydipsia.  Marland Kitchen.   Physical Examination Vitals:   12/31/16 1555  BP: 102/67  Pulse: 81   Vitals:   12/31/16 1555  Weight: 181 lb 6.4 oz (82.3 kg)  Height: 5\' 9"  (1.753 m)    Gen: resting comfortably, no acute distress HEENT: no scleral icterus, pupils equal round and reactive, no palptable cervical adenopathy,  CV: RRR, no m/r/g, no jvd Resp: Clear to auscultation bilaterally GI: abdomen is soft, non-tender, non-distended, normal bowel sounds, no hepatosplenomegaly MSK: extremities are warm, no edema.  Skin: warm, no rash Neuro:  no focal deficits Psych: appropriate affect   Diagnostic Studies 06/2009 cath  IMPRESSION: 1. Severe global left ventricular dysfunction with an ejection fraction of approximately 25% with significant hypocontractility to akinesis involving an inferior wall hypocontractility anterolaterally and significant hypocontractility in the inferolateral wall. 2. Significant multivessel coronary obstructive disease with separate ostium giving rise to the left anterior descending and left circumflex vessels with diffuse 70% ostial proximal left anterior descending stenosis, 60% left anterior descending stenosis after the takeoff of the first septal and diagonal vessel and bifurcation 95% and 80% septal mid left anterior descending stenosis. 3. Large circumflex system with questionable ostial catheter  spasm. 4. Total occlusion of the right coronary artery with extensive left-to- right collaterals. 5. An 80% left vertebral artery narrowing arising from the left circumflex system with evidence for plaque in the proximal subclavian system with a widely patent unbypassed left internal mammary artery. 6. 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  RECOMMENDATIONS: The patient will be evaluated for consideration of CABG revascularization surgery.   Jan 2018 cath Conclusions: 1.  Significant 2-vessel native coronary artery disease, including moderate diffuse proximal LAD disease and chronic total occlusion of mid LAD and ostial RCA.  70% proximal D1 stenosis is also present. 2.  Moderate, non-obstructive disease involving LCx.  Small Ilissa Rosner of OM2 is chronically occluded. 3.  Widely patent LIMA->LAD. 4.  Patent SVG->rPDA with mild diffuse disease and discrete 95% stenosis at the distal anastomosis. 5.  Chronically occluded SVG->D1. 6.  Mildly elevated left and right heart filling pressures, as well as moderate pulmonary hypertension. 7.  Normal to mildly reduced cardiac output. 8.  Successful PCI to distal SVG->rPDA anastomosis using a Resolute Onxy 4.0 x 12  mm drug-eluting stent with 0% residual stenosis and TIMI-3 flow.  Recommendations: 1.  Admit for overnight observation following PCI with severe systolic dysfunction. 2.  Dual antiplatelet therapy with aspirin and ticagrelor for at least 6-12 months, ideally indefinitely.  If patient experiences respiratory side effects from ticagrelor, he could be switched to clopidogrel. 3.  Avoid further IV hydration today; restart diuresis tomorrow if renal function allows. 4.  Aggressive medical therapy for severe ischemic cardiomyopathy.  If renal function stable, can consider starting ACEI tomorrow. 5.  Close outpatient follow-up with Dr.  Wyline Mood.  Assessment and Plan   1. CAD/ICM/Chronic systolic HF - recent echo shows LVEF 15%  a new finding for the patient - referred for cath, he received a stent to his SVG-rPDA - continue DAPT at least 1 year - weight down over 10 lbs, edema resolved, SOB improved. Continue prn lasix - medical therapy limited by soft bp's, no further titration today. Uptrend in Cr at recent discharge, repeat labs     F/u 2 months  Antoine Poche, M.D.

## 2016-12-31 NOTE — Patient Instructions (Signed)
Your physician recommends that you schedule a follow-up appointment in: 2 months with DR. BRANCH   Your physician recommends that you continue on your current medications as directed. Please refer to the Current Medication list given to you today.  Your physician recommends that you return for lab work CMP/MG  [facility]!!

## 2017-01-02 DIAGNOSIS — I255 Ischemic cardiomyopathy: Secondary | ICD-10-CM | POA: Diagnosis not present

## 2017-01-05 ENCOUNTER — Telehealth: Payer: Self-pay | Admitting: *Deleted

## 2017-01-05 NOTE — Telephone Encounter (Signed)
Pt aware - routed to pcp  

## 2017-01-05 NOTE — Telephone Encounter (Signed)
-----   Message from Antoine PocheJonathan F Branch, MD sent at 01/05/2017 12:17 PM EST ----- Labs look good  Dominga FerryJ Branch MD

## 2017-01-14 ENCOUNTER — Encounter: Admit: 2017-01-14 | Discharge: 2017-01-14 | Payer: PRIVATE HEALTH INSURANCE | Primary: Family Medicine

## 2017-01-15 LAB — HEMOGLOBIN A1C WITH EAG
Estimated average glucose: 154 mg/dL
Hemoglobin A1c: 7 % — ABNORMAL HIGH (ref 4.8–5.6)

## 2017-01-15 LAB — METABOLIC PANEL, COMPREHENSIVE
A-G Ratio: 1.7 (ref 1.2–2.2)
ALT (SGPT): 25 IU/L (ref 0–44)
AST (SGOT): 23 IU/L (ref 0–40)
Albumin: 4.5 g/dL (ref 3.5–5.5)
Alk. phosphatase: 55 IU/L (ref 39–117)
BUN/Creatinine ratio: 14 (ref 9–20)
BUN: 11 mg/dL (ref 6–24)
Bilirubin, total: 0.3 mg/dL (ref 0.0–1.2)
CO2: 23 mmol/L (ref 18–29)
Calcium: 9.6 mg/dL (ref 8.7–10.2)
Chloride: 97 mmol/L (ref 96–106)
Creatinine: 0.78 mg/dL (ref 0.76–1.27)
GFR est AA: 115 mL/min/{1.73_m2} (ref >59)
GFR est non-AA: 99 mL/min/{1.73_m2} (ref >59)
GLOBULIN, TOTAL: 2.7 g/dL (ref 1.5–4.5)
Glucose: 151 mg/dL — ABNORMAL HIGH (ref 65–99)
Potassium: 5.1 mmol/L (ref 3.5–5.2)
Protein, total: 7.2 g/dL (ref 6.0–8.5)
Sodium: 136 mmol/L (ref 134–144)

## 2017-01-15 LAB — T4, FREE: T4, Free: 1.09 ng/dL (ref 0.82–1.77)

## 2017-01-15 LAB — CBC W/O DIFF
HCT: 43.8 % (ref 37.5–51.0)
HGB: 14.7 g/dL (ref 13.0–17.7)
MCH: 31.1 pg (ref 26.6–33.0)
MCHC: 33.6 g/dL (ref 31.5–35.7)
MCV: 93 fL (ref 79–97)
PLATELET: 331 10*3/uL (ref 150–379)
RBC: 4.73 x10E6/uL (ref 4.14–5.80)
RDW: 12.9 % (ref 12.3–15.4)
WBC: 7.1 10*3/uL (ref 3.4–10.8)

## 2017-01-15 LAB — LIPID PANEL
Cholesterol, total: 115 mg/dL (ref 100–199)
HDL Cholesterol: 35 mg/dL — ABNORMAL LOW (ref >39)
LDL, calculated: 61 mg/dL (ref 0–99)
Triglyceride: 94 mg/dL (ref 0–149)
VLDL, calculated: 19 mg/dL (ref 5–40)

## 2017-01-15 LAB — MICROALBUMIN, UR, RAND W/ MICROALB/CREAT RATIO
Creatinine, urine random: 130.6 mg/dL
Microalb/Creat ratio (ug/mg creat.): 3.4 mg/g creat (ref 0.0–30.0)
Microalbumin, urine: 4.4 ug/mL

## 2017-01-15 LAB — PSA, DIAGNOSTIC (PROSTATE SPECIFIC AG): Prostate Specific Ag: 1.2 ng/mL (ref 0.0–4.0)

## 2017-01-15 LAB — TSH 3RD GENERATION: TSH: 2.15 u[IU]/mL (ref 0.450–4.500)

## 2017-01-20 ENCOUNTER — Encounter

## 2017-01-22 NOTE — Telephone Encounter (Signed)
[facility] is requesting the continuing Chantix pack, not the starter pack.  [phone]

## 2017-01-23 MED ORDER — VARENICLINE 1 MG TAB
1 mg | ORAL_TABLET | Freq: Two times a day (BID) | ORAL | 0 refills | Status: DC
Start: 2017-01-23 — End: 2017-03-23

## 2017-01-23 NOTE — Telephone Encounter (Signed)
Done.

## 2017-01-25 NOTE — Progress Notes (Signed)
CMP-Normal electrolyte levels except for a elevation in glucose levels, normal renal, and liver function.  A1c-Your current hgbA1c is higher than your last level 7.0.  Work on following a diabetic diet and exercise.  Recheck this test: hgbA1c  in  3 months.  Continue with current  medications.    Lipids-low hdl,    You have a normal total cholesterol, triglyceride level and ldl.

## 2017-01-27 NOTE — Telephone Encounter (Signed)
Letter printed by Dr Latham on 01/25/17 has been mailed out to pt.

## 2017-01-28 NOTE — Telephone Encounter (Signed)
Letter printed by Dr Latham on 01/25/17 has been mailed out to pt.

## 2017-03-09 ENCOUNTER — Ambulatory Visit (INDEPENDENT_AMBULATORY_CARE_PROVIDER_SITE_OTHER): Payer: Medicare Other | Admitting: Cardiology

## 2017-03-09 ENCOUNTER — Encounter: Payer: Self-pay | Admitting: Cardiology

## 2017-03-09 VITALS — BP 116/70 | HR 79 | Ht 70.0 in | Wt 184.4 lb

## 2017-03-09 DIAGNOSIS — I5022 Chronic systolic (congestive) heart failure: Secondary | ICD-10-CM | POA: Diagnosis not present

## 2017-03-09 DIAGNOSIS — I472 Ventricular tachycardia: Secondary | ICD-10-CM | POA: Diagnosis not present

## 2017-03-09 DIAGNOSIS — I2510 Atherosclerotic heart disease of native coronary artery without angina pectoris: Secondary | ICD-10-CM | POA: Diagnosis not present

## 2017-03-09 DIAGNOSIS — I4729 Other ventricular tachycardia: Secondary | ICD-10-CM

## 2017-03-09 DIAGNOSIS — I6523 Occlusion and stenosis of bilateral carotid arteries: Secondary | ICD-10-CM | POA: Diagnosis not present

## 2017-03-09 DIAGNOSIS — I255 Ischemic cardiomyopathy: Secondary | ICD-10-CM | POA: Diagnosis not present

## 2017-03-09 MED ORDER — METOPROLOL SUCCINATE ER 25 MG PO TB24: 37.5000 mg | ORAL_TABLET | Freq: Every day | ORAL | 6 refills | Status: DC

## 2017-03-09 NOTE — Patient Instructions (Signed)
Medication Instructions:   Increase Toprol XL to 37.5mg  daily.  Continue all other medications.    Labwork: none  Testing/Procedures: none  Follow-Up: 1 month   Any Other Special Instructions Will Be Listed Below (If Applicable).  If you need a refill on your cardiac medications before your next appointment, please call your pharmacy.

## 2017-03-09 NOTE — Progress Notes (Signed)
Clinical Summary Dave [name] is a 61 y.o.male seen today for follow up of the following medical problems.   1. CAD/Chronic systolic HF -  06/2009 CABG x 3 (LIMA-LAD, SVG-diag, SVG-PDA). He presented with an anterior MI.  - from prior clinic notes, LVEF 40% (do not see echo report) at that time.  - recent issues with severe SOB and DOE -11/2016 echo that showed LVEF 15%, diffuse hypokinesis.  - Jan 2018 cath as reported below, received DES to distal SVG-rPDA.  RHC with CI 2, mean PA 39, PCWP 29, LVEDP 22 -  Discharge weight 191 lbs. Today weight is 181 lbs.  - not interested in cardiac rehab.     - no recent SOB/DOE. Compliant with meds. Not checking weight. Limiting sodium intake.  2. COPD - diagnosis mentioned in chart, however he is unaware of PFTs in the past   3. NSVT - denies any recent palpitations    4. Carotid stenosis  - moderate by [place] Jan 2018, asymptomatic    Past Medical History:  Diagnosis Date  . Anterior myocardial infarction (HCC) 06/2009   [name] 12/16/2016  . Anxiety   . Anxiety disorder   . Chest pain   . Chronic brain syndrome   . Elevated blood pressure   . Tobacco abuse      No Known Allergies   Current Outpatient Prescriptions  Medication Sig Dispense Refill  . albuterol (PROVENTIL) (2.5 MG/3ML) 0.083% nebulizer solution Inhale 2.5 mg into the lungs every 4 (four) hours as needed for wheezing or shortness of breath.   99  . aspirin EC 81 MG tablet Take 81 mg by mouth daily.    Marland Kitchen atorvastatin (LIPITOR) 80 MG tablet Take 1 tablet (80 mg total) by mouth daily. 90 tablet 3  . furosemide (LASIX) 20 MG tablet TAKE 1 TAB DAILY AS NEEDED FOR SWELLING 90 tablet 3  . lisinopril (PRINIVIL,ZESTRIL) 2.5 MG tablet Take 1 tablet (2.5 mg total) by mouth daily. 30 tablet 5  . metoprolol succinate (TOPROL XL) 25 MG 24 hr tablet Take 1 tablet (25 mg total) by mouth daily. 30 tablet 5  . nitroGLYCERIN (NITROSTAT) 0.4 MG SL tablet Place 1 tablet (0.4  mg total) under the tongue every 5 (five) minutes as needed for chest pain. 25 tablet 2  . PROAIR HFA 108 (90 Base) MCG/ACT inhaler Inhale 2 puffs into the lungs 4 (four) times daily as needed for shortness of breath.     . ticagrelor (BRILINTA) 90 MG TABS tablet Take 1 tablet (90 mg total) by mouth 2 (two) times daily. 60 tablet 10  . ticagrelor (BRILINTA) 90 MG TABS tablet Take 1 tablet (90 mg total) by mouth 2 (two) times daily. 180 tablet 3   No current facility-administered medications for this visit.      Past Surgical History:  Procedure Laterality Date  . CARDIAC CATHETERIZATION  06/2009   [name] 12/16/2016  . CARDIAC CATHETERIZATION N/A 12/18/2016   Procedure: Right/Left Heart Cath and Coronary/Graft Angiography;  Surgeon: Yvonne Kendall, MD;  Location: [facility] INVASIVE CV LAB;  Service: Cardiovascular;  Laterality: N/A;  . CARDIAC CATHETERIZATION N/A 12/18/2016   Procedure: Coronary Stent Intervention;  Surgeon: Yvonne Kendall, MD;  Location: [facility] INVASIVE CV LAB;  Service: Cardiovascular;  Laterality: N/A;  SVG-PDA  . CORONARY ANGIOPLASTY    . CORONARY ARTERY BYPASS GRAFT  2010   X3  . LAPAROSCOPIC CHOLECYSTECTOMY  2007     No Known Allergies    Family History  Problem Relation Age of Onset  . Coronary artery disease       Social History Mr. Frett reports that he has been smoking.  He has a 11.00 pack-year smoking history. He has never used smokeless tobacco. Mr. Inabinet reports that he does not drink alcohol.   Review of Systems CONSTITUTIONAL: No weight loss, fever, chills, weakness or fatigue.  HEENT: Eyes: No visual loss, blurred vision, double vision or yellow sclerae.No hearing loss, sneezing, congestion, runny nose or sore throat.  SKIN: No rash or itching.  CARDIOVASCULAR: per hpi RESPIRATORY: No shortness of breath, cough or sputum.  GASTROINTESTINAL: No anorexia, nausea, vomiting or diarrhea. No abdominal pain or blood.  GENITOURINARY: No burning on  urination, no polyuria NEUROLOGICAL: No headache, dizziness, syncope, paralysis, ataxia, numbness or tingling in the extremities. No change in bowel or bladder control.  MUSCULOSKELETAL: No muscle, back pain, joint pain or stiffness.  LYMPHATICS: No enlarged nodes. No history of splenectomy.  PSYCHIATRIC: No history of depression or anxiety.  ENDOCRINOLOGIC: No reports of sweating, cold or heat intolerance. No polyuria or polydipsia.  Marland Kitchen   Physical Examination Vitals:   03/09/17 1507  BP: 116/70  Pulse: 79   Vitals:   03/09/17 1507  Weight: 184 lb 6.4 oz (83.6 kg)  Height:  (1.778 m)    Gen: resting comfortably, no acute distress HEENT: no scleral icterus, pupils equal round and reactive, no palptable cervical adenopathy,  CV: RRR, no m/r/g, no jvd Resp: Clear to auscultation bilaterally GI: abdomen is soft, non-tender, non-distended, normal bowel sounds, no hepatosplenomegaly MSK: extremities are warm, no edema.  Skin: warm, no rash Neuro:  no focal deficits Psych: appropriate affect   Diagnostic Studies 06/2009 cath  IMPRESSION: 1. Severe global left ventricular dysfunction with an ejection fraction of approximately 25% with significant hypocontractility to akinesis involving an inferior wall hypocontractility anterolaterally and significant hypocontractility in the inferolateral wall. 2. Significant multivessel coronary obstructive disease with separate ostium giving rise to the left anterior descending and left circumflex vessels with diffuse 70% ostial proximal left anterior descending stenosis, 60% left anterior descending stenosis after the takeoff of the first septal and diagonal vessel and bifurcation 95% and 80% septal mid left anterior descending stenosis. 3. Large circumflex system with questionable ostial catheter spasm. 4. Total occlusion of the right coronary artery with extensive  left-to- right collaterals. 5. An 80% left vertebral artery narrowing arising from the left circumflex system with evidence for plaque in the proximal subclavian system with a widely patent unbypassed left internal mammary artery. 6. 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  RECOMMENDATIONS: The patient will be evaluated for consideration of CABG revascularization surgery.   Jan 2018 cath Conclusions: 1. Significant 2-vessel native coronary artery disease, including moderate diffuse proximal LAD disease and chronic total occlusion of mid LAD and ostial RCA. 70% proximal D1 stenosis is also present. 2. Moderate, non-obstructive disease involving LCx. Small Mehtaab Mayeda of OM2 is chronically occluded. 3. Widely patent LIMA->LAD. 4. Patent SVG->rPDA with mild diffuse disease and discrete 95% stenosis at the distal anastomosis. 5. Chronically occluded SVG->D1. 6. Mildly elevated left and right heart filling pressures, as well as moderate pulmonary hypertension. 7. Normal to mildly reduced cardiac output. 8. Successful PCI to distal SVG->rPDA anastomosis using a Resolute Onxy 4.0 x 12 mm drug-eluting stent with 0% residual stenosis and TIMI-3 flow.  Recommendations: 1. Admit for overnight observation following PCI with severe systolic dysfunction. 2. Dual antiplatelet therapy  with aspirin and ticagrelor for at least 6-12 months, ideally indefinitely. If patient experiences respiratory side effects from ticagrelor, he could be switched to clopidogrel. 3. Avoid further IV hydration today; restart diuresis tomorrow if renal function allows. 4. Aggressive medical therapy for severe ischemic cardiomyopathy. If renal function stable, can consider starting ACEI tomorrow. 5. Close outpatient follow-up with Dr. Wyline Mood.    Assessment and Plan  1. CAD/ICM/Chronic systolic HF - recent  echo shows LVEF 15%  a new finding for the patient - referred for cath, he received a stent to his SVG-rPDA - continue DAPT at least 1 year - we will increase Toprl XL to 37.5mg  dialy  2. NSVT - no recent symptoms - continue to monitor  3. Carotid stenosis - repeat carotid US next year   F/u 1 month      Antoine Poche, M.D.

## 2017-03-13 ENCOUNTER — Encounter: Attending: Family Medicine | Primary: Family Medicine

## 2017-03-23 ENCOUNTER — Encounter

## 2017-03-23 DIAGNOSIS — M79604 Pain in right leg: Secondary | ICD-10-CM | POA: Diagnosis not present

## 2017-03-23 DIAGNOSIS — I739 Peripheral vascular disease, unspecified: Secondary | ICD-10-CM | POA: Diagnosis not present

## 2017-03-23 NOTE — Telephone Encounter (Signed)
Is he still smoking?

## 2017-03-25 MED ORDER — CHANTIX 1 MG TABLET
1 mg | ORAL_TABLET | ORAL | 0 refills | Status: DC
Start: 2017-03-25 — End: 2017-04-17

## 2017-03-25 NOTE — Telephone Encounter (Signed)
Anthony [name] from [facility] is requesting a call back regarding refill request on "Chantix" [place] contact [phone].     Message received & copied from [facility]

## 2017-03-25 NOTE — Telephone Encounter (Signed)
Identified patient 2 identifiers verified. Patient reports that he stopped smoking on 02/16/17

## 2017-04-03 DIAGNOSIS — Z6826 Body mass index (BMI) 26.0-26.9, adult: Secondary | ICD-10-CM | POA: Diagnosis not present

## 2017-04-03 DIAGNOSIS — I2510 Atherosclerotic heart disease of native coronary artery without angina pectoris: Secondary | ICD-10-CM | POA: Diagnosis not present

## 2017-04-03 DIAGNOSIS — F17210 Nicotine dependence, cigarettes, uncomplicated: Secondary | ICD-10-CM | POA: Diagnosis not present

## 2017-04-03 DIAGNOSIS — I739 Peripheral vascular disease, unspecified: Secondary | ICD-10-CM | POA: Diagnosis not present

## 2017-04-03 DIAGNOSIS — M545 Low back pain: Secondary | ICD-10-CM | POA: Diagnosis not present

## 2017-04-03 DIAGNOSIS — Z299 Encounter for prophylactic measures, unspecified: Secondary | ICD-10-CM | POA: Diagnosis not present

## 2017-04-03 DIAGNOSIS — F17200 Nicotine dependence, unspecified, uncomplicated: Secondary | ICD-10-CM | POA: Diagnosis not present

## 2017-04-03 DIAGNOSIS — Z720 Tobacco use: Secondary | ICD-10-CM | POA: Diagnosis not present

## 2017-04-03 DIAGNOSIS — I255 Ischemic cardiomyopathy: Secondary | ICD-10-CM | POA: Diagnosis not present

## 2017-04-09 ENCOUNTER — Encounter: Payer: Self-pay | Admitting: Cardiology

## 2017-04-09 ENCOUNTER — Ambulatory Visit (INDEPENDENT_AMBULATORY_CARE_PROVIDER_SITE_OTHER): Payer: Medicare Other | Admitting: Cardiology

## 2017-04-09 VITALS — BP 110/70 | HR 68 | Ht 70.0 in | Wt 185.6 lb

## 2017-04-09 DIAGNOSIS — I2510 Atherosclerotic heart disease of native coronary artery without angina pectoris: Secondary | ICD-10-CM | POA: Diagnosis not present

## 2017-04-09 DIAGNOSIS — I255 Ischemic cardiomyopathy: Secondary | ICD-10-CM

## 2017-04-09 DIAGNOSIS — I6523 Occlusion and stenosis of bilateral carotid arteries: Secondary | ICD-10-CM | POA: Diagnosis not present

## 2017-04-09 DIAGNOSIS — I5022 Chronic systolic (congestive) heart failure: Secondary | ICD-10-CM | POA: Diagnosis not present

## 2017-04-09 MED ORDER — METOPROLOL SUCCINATE ER 50 MG PO TB24: 50.0000 mg | ORAL_TABLET | Freq: Every day | ORAL | 3 refills | Status: DC

## 2017-04-09 NOTE — Patient Instructions (Signed)
Your physician recommends that you schedule a follow-up appointment in: [facility]  Your physician has recommended you make the following change in your medication:   INCREASE TOPROL XL 50 MG DAILY  WE WILL SCHEDULE NURSE VISIT FOR 2 WEEKS FOR HEART RATE AND BLOOD PRESSURE CHECK   Thank you for choosing [facility]!!

## 2017-04-09 NOTE — Progress Notes (Signed)
Clinical Summary [name] is a 61 y.o.male seen today for follow up of the following medical problems.   1. CAD/Chronic systolic HF - 06/2009 CABG x 3 (LIMA-LAD, SVG-diag, SVG-PDA). He presented with an anterior MI.  - from prior clinic notes, LVEF 40% (do not see echo report) at that time.  - recent issues with severe SOB and DOE -11/2016 echo that showed LVEF 15%, diffuse hypokinesis.  - Jan 2018 cath as reported below, received DES to distal SVG-rPDA. RHC with CI 2, mean PA 39, PCWP 29, LVEDP 22 - Discharge weight 191 lbs. Today weight is 181 lbs.  - not interested in cardiac rehab.   .  - last visit we incrased Toprll XL to 37.5 mg daily. No new side effects - no SOB/DOE, no LE edema. NO chest pain.    2. COPD - diagnosis mentioned in chart, however he is unaware of PFTs in the past   3. NSVT - denies any recent palpitations since last visit   4. Carotid stenosis  - moderate by US Jan 2018, asymptomatic  Past Medical History:  Diagnosis Date  . Anterior myocardial infarction (HCC) 06/2009   Dave [name]  . Anxiety   . Anxiety disorder   . Chest pain   . Chronic brain syndrome   . Elevated blood pressure   . Tobacco abuse      No Known Allergies   Current Outpatient Prescriptions  Medication Sig Dispense Refill  . albuterol (PROVENTIL) (2.5 MG/3ML) 0.083% nebulizer solution Inhale 2.5 mg into the lungs every 4 (four) hours as needed for wheezing or shortness of breath.   99  . aspirin EC 81 MG tablet Take 81 mg by mouth daily.    Dave [name]. atorvastatin (LIPITOR) 80 MG tablet Take 1 tablet (80 mg total) by mouth daily. 90 tablet 3  . furosemide (LASIX) 20 MG tablet TAKE 1 TAB DAILY AS NEEDED FOR SWELLING 90 tablet 3  . lisinopril (PRINIVIL,ZESTRIL) 2.5 MG tablet Take 1 tablet (2.5 mg total) by mouth daily. 30 tablet 5  . metoprolol succinate (TOPROL XL) 25 MG 24 hr tablet Take 1.5 tablets (37.5 mg total) by mouth daily. 45 tablet 6  .  nitroGLYCERIN (NITROSTAT) 0.4 MG SL tablet Place 1 tablet (0.4 mg total) under the tongue every 5 (five) minutes as needed for chest pain. 25 tablet 2  . PROAIR HFA 108 (90 Base) MCG/ACT inhaler Inhale 2 puffs into the lungs 4 (four) times daily as needed for shortness of breath.     . ticagrelor (BRILINTA) 90 MG TABS tablet Take 1 tablet (90 mg total) by mouth 2 (two) times daily. 60 tablet 10   No current facility-administered medications for this visit.      Past Surgical History:  Procedure Laterality Date  . CARDIAC CATHETERIZATION  06/2009   Dave [name]  . CARDIAC CATHETERIZATION N/A 12/18/2016   Procedure: Right/Left Heart Cath and Coronary/Graft Angiography;  Surgeon: Dave Kendallhristopher End, MD;  [facility];  Service: Cardiovascular;  Laterality: N/A;  . CARDIAC CATHETERIZATION N/A 12/18/2016   Procedure: Coronary Stent Intervention;  Surgeon: Dave Kendallhristopher End, MD;  Location: [facility] INVASIVE CV LAB;  Service: Cardiovascular;  Laterality: N/A;  SVG-PDA  . CORONARY ANGIOPLASTY    . CORONARY ARTERY BYPASS GRAFT  2010   X3  . LAPAROSCOPIC CHOLECYSTECTOMY  2007     No Known Allergies    Family History  Problem Relation Age of Onset  . Coronary artery disease  Social History Dave [name] reports that he has been smoking.  He has a 22.00 pack-year smoking history. He has never used smokeless tobacco. Dave [name] reports that he does not drink alcohol.   Review of Systems CONSTITUTIONAL: No weight loss, fever, chills, weakness or fatigue.  HEENT: Eyes: No visual loss, blurred vision, double vision or yellow sclerae.No hearing loss, sneezing, congestion, runny nose or sore throat.  SKIN: No rash or itching.  CARDIOVASCULAR: per hpi RESPIRATORY: No shortness of breath, cough or sputum.  GASTROINTESTINAL: No anorexia, nausea, vomiting or diarrhea. No abdominal pain or blood.  GENITOURINARY: No burning on urination, no polyuria NEUROLOGICAL: No headache,  dizziness, syncope, paralysis, ataxia, numbness or tingling in the extremities. No change in bowel or bladder control.  MUSCULOSKELETAL: No muscle, back pain, joint pain or stiffness.  LYMPHATICS: No enlarged nodes. No history of splenectomy.  PSYCHIATRIC: No history of depression or anxiety.  ENDOCRINOLOGIC: No reports of sweating, cold or heat intolerance. No polyuria or polydipsia.  Dave [name]   Physical Examination Vitals:   04/09/17 1422  BP: 110/70  Pulse: 68   Vitals:   04/09/17 1422  Weight: 185 lb 9.6 oz (84.2 kg)  Height: 5\' 10"  (1.778 m)    Gen: resting comfortably, no acute distress HEENT: no scleral icterus, pupils equal round and reactive, no palptable cervical adenopathy,  CV: RRR, no m/t/g, no jvd Resp: Clear to auscultation bilaterally GI: abdomen is soft, non-tender, non-distended, normal bowel sounds, no hepatosplenomegaly MSK: extremities are warm, no edema.  Skin: warm, no rash Neuro:  no focal deficits Psych: appropriate affect   Diagnostic Studies  06/2009 cath  IMPRESSION: 1. Severe global left ventricular dysfunction with an ejection fraction of approximately 25% with significant hypocontractility to akinesis involving an inferior wall hypocontractility anterolaterally and significant hypocontractility in the inferolateral wall. 2. Significant multivessel coronary obstructive disease with separate ostium giving rise to the left anterior descending and left circumflex vessels with diffuse 70% ostial proximal left anterior descending stenosis, 60% left anterior descending stenosis after the takeoff of the first septal and diagonal vessel and bifurcation 95% and 80% septal mid left anterior descending stenosis. 3. Large circumflex system with questionable ostial catheter spasm. 4. Total occlusion of the right coronary artery with extensive left-to- right collaterals. 5. An 80% left vertebral artery  narrowing arising from the left circumflex system with evidence for plaque in the proximal subclavian system with a widely patent unbypassed left internal mammary artery. 6. 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  RECOMMENDATIONS: The patient will be evaluated for consideration of CABG revascularization surgery.   Jan 2018 cath Conclusions: 1. Significant 2-vessel native coronary artery disease, including moderate diffuse proximal LAD disease and chronic total occlusion of mid LAD and ostial RCA. 70% proximal D1 stenosis is also present. 2. Moderate, non-obstructive disease involving LCx. Small Jennaya Pogue of OM2 is chronically occluded. 3. Widely patent LIMA->LAD. 4. Patent SVG->rPDA with mild diffuse disease and discrete 95% stenosis at the distal anastomosis. 5. Chronically occluded SVG->D1. 6. Mildly elevated left and right heart filling pressures, as well as moderate pulmonary hypertension. 7. Normal to mildly reduced cardiac output. 8. Successful PCI to distal SVG->rPDA anastomosis using a Resolute Onxy 4.0 x 12 mm drug-eluting stent with 0% residual stenosis and TIMI-3 flow.  Recommendations: 1. Admit for overnight observation following PCI with severe systolic dysfunction. 2. Dual antiplatelet therapy with aspirin and ticagrelor for at least 6-12 months, ideally indefinitely. If patient experiences respiratory  side effects from ticagrelor, he could be switched to clopidogrel. 3. Avoid further IV hydration today; restart diuresis tomorrow if renal function allows. 4. Aggressive medical therapy for severe ischemic cardiomyopathy. If renal function stable, can consider starting ACEI tomorrow. 5. Close outpatient follow-up with Dr. Wyline Mood.    Assessment and Plan  1. CAD/ICM/Chronic systolic HF - recent echo shows LVEF 15% a new finding for the patient - referred for  cath, he received a stent to his SVG-rPDA - continue DAPT at least 1 year  - we will increase Toprl XL to 50mg  daily. Nursing visit with vitals check in 2 weeks, pending results further titrate meds  2. NSVT - no symptoms - continue beta blocker  3. Carotid stenosis - we will repeat carotid US next year   F/u 1 month       Antoine Poche, M.D.

## 2017-04-17 ENCOUNTER — Ambulatory Visit: Admit: 2017-04-17 | Payer: PRIVATE HEALTH INSURANCE | Attending: Family Medicine | Primary: Family Medicine

## 2017-04-17 DIAGNOSIS — I10 Essential (primary) hypertension: Secondary | ICD-10-CM

## 2017-04-17 LAB — AMB POC HEMOGLOBIN A1C: Hemoglobin A1c (POC): 7.8 %

## 2017-04-17 MED ORDER — METFORMIN SR 500 MG 24 HR TABLET
500 mg | ORAL_TABLET | Freq: Every day | ORAL | 6 refills | Status: DC
Start: 2017-04-17 — End: 2017-10-26

## 2017-04-17 MED ORDER — VARENICLINE 1 MG TAB
1 mg | ORAL_TABLET | ORAL | 0 refills | Status: DC
Start: 2017-04-17 — End: 2017-07-16

## 2017-04-17 NOTE — Progress Notes (Signed)
Pt scheduled appt for Wednesday May 16th at [facility]9AM.    Left message for pt to call back to schedule appt for nutrition counseling.     Message  Received: Today ??   ?? Anthony [name] C Latham, MD  Lattie Hawianne K Yelena Metzer, RN ??   ??    ??    ??   ?? This patient is a diabetic would like nutritional counseling.       POC A1c 04/17/17 is 7.8%    Lab Results   Component Value Date/Time    Hemoglobin A1c 7.0 (H) 01/14/2017 08:54 AM    Hemoglobin A1c 6.5 (H) 12/26/2015 10:57 AM    Hemoglobin A1c 6.5 (H) 06/27/2015 08:18 AM     Key Antihyperglycemic Medications             metFORMIN ER (GLUCOPHAGE XR) 500 mg tablet Take 1 Tab by mouth daily (with dinner).

## 2017-04-17 NOTE — Progress Notes (Signed)
Reviewed record in preparation for visit and have obtained necessary documentation.    Identified pt with two pt identifiers(name and DOB).    Chief Complaint   Patient presents with   ??? Abdominal Pain   ??? Nicotine Dependence       Health Maintenance Due   Topic Date Due   ??? Diabetic Foot Care  02/07/1968   ??? Eye Exam  02/07/1968   ??? Pneumococcal Vaccine (1 of 1 - PPSV23) 02/06/1977   ??? DTaP/Tdap/Td  (1 - Tdap) 02/07/1979   ??? Stool testing for trace blood  02/07/2008       Anthony [name] has a reminder for a "due or due soon" health maintenance. I have asked that he discuss this further with his primary care provider for follow-up on this health maintenance.      Coordination of Care Questionnaire:  :     1) Have you been to an emergency room, urgent care clinic since your last visit? No     Hospitalized since your last visit? no             2) Have you seen or consulted any other health care providers outside of [facility] since your last visit? no  (Include any pap smears or colon screenings in this section.)    3) In the event something were to happen to you and you were unable to speak on your behalf, do you have an Advance Directive/ Living Will in place stating your wishes? No    Do you have an Advance Directive on file? No     4) Are you interested in receiving information on Advance Directives? No     Patient is accompanied by self I have received verbal consent from [name] to discuss any/all medical information while they are present in the room.

## 2017-04-17 NOTE — Progress Notes (Signed)
SUBJECTIVE:   [name] is a 61 y.o. male who is here for follow up of routine medical issues.      HTN: Pt is compliant in taking metoprolol succinate. Patient denies chest pain, edema, headache, visual changes, dizziness, palpitations or syncope. Pt's BP in the office today was slightly elevated at 135/83.    Pt is compliant taking Chantix. He reports it is working well and quit smoking in March 2018. Pt reports he has mild SOB with exertion, especially later in the day. He has some wheezing related to seasonal allergies.     Pt reports he had right sided abdominal pain and groin pain that lasted for 1-1.5 weeks that presented several weeks ago. He reports that he had an intense episode of pain while sitting up after lying down. He denies changes to his bowel habits.     Pt has not yet followed up with Dr. Angela [name] (urology) about his prostate concerns.     He reports he had an eye exam around March 2018.     At this time, he is otherwise doing well and has brought no other complaints to my attention today.  For a list of the medical issues addressed today, see the assessment and plan below.    PMH:   Past Medical History:   Diagnosis Date   ??? CAD (coronary artery disease)     heart attack 2011   ??? Chronic obstructive pulmonary disease (HCC)    ??? Hypercholesterolemia    ??? Hypertension      PSH:  has a past surgical history that includes pr cardiac surg procedure unlist.    All: has No Known Allergies.   MEDS:   Current Outpatient Prescriptions   Medication Sig   ??? varenicline (CHANTIX) 1 mg tablet TAKE 1 TAB BY MOUTH TWO (2) TIMES A DAY FOR 60 DAYS.   ??? metoprolol succinate (TOPROL-XL) 50 mg XL tablet Take 50 mg by mouth daily.   ??? rosuvastatin (CRESTOR) 10 mg tablet Take 10 mg by mouth nightly.   ??? omega-3 fatty acids-vitamin e 1,000 mg cap Take 1 Cap by mouth daily.   ??? aspirin delayed-release 81 mg tablet Take 81 mg by mouth daily.   ??? multivitamin (ONE A DAY) tablet Take 1 Tab by mouth daily.    ??? glucosamine-chondroitin (ARTHX) 500-400 mg cap Take 1 Cap by mouth daily.     No current facility-administered medications for this visit.        FH: family history includes Diabetes in his brother; Heart Disease in his father; Hypertension in his mother.   SH:  reports that he quit smoking about 1 months ago. He has a 40.00 pack-year smoking history. He has never used smokeless tobacco. He reports that he does not drink alcohol or use illicit drugs.     Review of Systems - History obtained from the patient  General ROS: no fever, chills, fatigue, body aches  Psychological ROS: no change in anxiety, depression, SI/HI  Ophthalmic ROS: no blurred vision, myopia, double vision  ENT ROS: no dysphagia, otalgia, otorrhea, rhinorrhea, post nasal drip  Respiratory ROS: SOB, wheezing   Cardiovascular ROS: no chest pain or dyspnea on exertion  Gastrointestinal ROS: abdominal pain that radiates to groin no change in bowel habits, or black or bloody stools  Genito-Urinary ROS: no frequency, urgency, incontinence, dysuria, hematouria  Musculoskeletal ROS: no arthralagia, myalgia  Neurological ROS: no headaches, dizziness, lightheadedness, tremors, seizures  Dermatological ROS: no rash or  lesions    OBJECTIVE:   Vitals:   Visit Vitals   ??? BP 135/83 (BP 1 Location: Left arm, BP Patient Position: Sitting)   ??? Pulse 76   ??? Temp 98.5 ??F (36.9 ??C) (Oral)   ??? Resp 18   ??? Ht 5' 10.5" (1.791 m)   ??? Wt 253 lb (114.8 kg)   ??? SpO2 95%   ??? BMI 35.79 kg/m2      Gen: Pleasant 61 y.o.  male in NAD.    HEENT: PERRLA. EOMI. OP moist and pink.    Neck: Supple.  No LAD.   HEART: RRR, No M/G/R.      LUNGS: CTAB No W/R.    ABDOMEN: S, NT, ND, BS+.    EXTREMITIES: Warm. No C/C/E.    MUSCULOSKELETAL: Normal ROM, muscle strength 5/5 all groups.    NEURO: Alert and oriented x 3.  Cranial nerves grossly intact.  No focal sensory or motor deficits noted.   SKIN: Warm. Dry. No rashes or other lesions noted.     ASSESSMENT/ PLAN: Diagnoses and all orders for this visit:    1. Essential hypertension    2. Discontinued smoking    3. Smoking trying to quit  -     varenicline (CHANTIX) 1 mg tablet; TAKE 1 TAB BY MOUTH TWO (2) TIMES A DAY FOR 60 DAYS.    4. SOB (shortness of breath) on exertion  -     PULMONARY FUNCTION TEST; Future    5. Controlled type 2 diabetes mellitus without complication, with long-term current use of insulin (HCC)  -     AMB POC HEMOGLOBIN A1C        ICD-10-CM ICD-9-CM    1. Essential hypertension I10 401.9    2. Discontinued smoking Z87.891 V15.82    3. Smoking trying to quit Z72.0 305.1 varenicline (CHANTIX) 1 mg tablet   4. SOB (shortness of breath) on exertion R06.02 786.05 PULMONARY FUNCTION TEST   5. Controlled type 2 diabetes mellitus without complication, with long-term current use of insulin (HCC) E11.9 250.00 AMB POC HEMOGLOBIN A1C    Z79.4 V58.67       1. Hypertension  BP seems to be controlled. I recommended continuing current dose of metoprolol succinate, eating a low sodium diet, and increasing exercise.     2. Discontinued smoking  Treatment in #3.     3. Smoking trying to quit   I advised pt to continue to use Chantix (refill given). We will discuss in June whether he needs to continue with the medication.     4. SOB  I advised pt to get a PFT to further assess his condition.     5. DM type 2  POC HgA1c was 7.8.  I prescribed 500mg  metformin XR to take daily. I also advised pt to follow up with Anthony [name]. I advised pt avoid sugars and starches and to increase exercise when possible.     Follow-up Disposition:  Return in about 8 months (around 12/28/2017) for annual physical.    I have reviewed the patient's medications and risks/side effects/benefits were discussed. Diagnosis(-es) explained to patient and questions answered. Literature provided where appropriate.     Written by Magda Kiel, [place], as dictated by Jackolyn Confer, MD.

## 2017-04-21 ENCOUNTER — Encounter: Payer: Self-pay | Admitting: Vascular Surgery

## 2017-04-22 ENCOUNTER — Encounter

## 2017-04-22 ENCOUNTER — Ambulatory Visit: Admit: 2017-04-22 | Discharge: 2017-04-23 | Payer: PRIVATE HEALTH INSURANCE | Primary: Family Medicine

## 2017-04-22 DIAGNOSIS — E119 Type 2 diabetes mellitus without complications: Secondary | ICD-10-CM

## 2017-04-22 NOTE — Patient Instructions (Signed)
1) continue taking metformin with dinner    2) start checking fasting blood sugar every morning    3) start exercising 3-4 days a week     4) call Zeda Gangwer at [phone] with blood sugars and with any questions or concerns    5) follow up with Dr. Emilee HeroLatham in August

## 2017-04-22 NOTE — Telephone Encounter (Signed)
Pt here for diabetes education and is requesting refill on his glucometer test strips as his are expired. Refill pended to Dr. Emilee HeroLatham.

## 2017-04-22 NOTE — Progress Notes (Signed)
Was asked by Dr. Cira Servant to see patient for nutrition counseling for new diabetes diagnosis. Last A1c was 7.8% on 04/17/17. Previous diabetes education -none.     Presentation/Accompanied by- ambulatory; pt lives with his wife whom he shares the cooking with; works for [employer]    History/Family History- mom and brother    Symptoms- some rt foot tingling for years    Self Care Behaviors- has changed his diet considerably in the past month; drinks black coffee now; has lost 10# in the past two weeks; wants to be able to come off of metformin; quit smoking earlier this year    1) Healthy Eating/Food Recall-  BK- gets up and eats by 7AM- 6-8 oz serving of spinach quiche with bottom crust only from [employer], water, black coffee, cup of homemade ginger tumeric lemon juice with 1 tsp honey; or banana; or apple with peanut butter    LN- eats out noon-1pm- [employer], broiled fish, 1/2 cup succotash, broccoli, 12 oz grape juice and apple cider vinegar mix (7carbs); or BBQ sandwich with 2 T self serve bbq sauce, 1/2 cup baked beans or mac and cheese, water    DN- 6:30-8pm- leftover [place] strip, apple, 1 slice homemade 9 grain bread, coffee    SN- has a stash in his truck and mid morning or afternoon may have 2 handfuls of nuts or 1/3 cup oats and honey granola    2) Being Active- does not currently exercise, but has a Higher education careers adviser at [employer] and will start attending again    3) Self Monitoring Blood Glucose (SMBG)- not currently monitoring; strips are expired; rx for Accu Chek test strips to use with the Nano machine pended to Dr. Cira Servant; pt will start checking fasting every morning    4) Taking Medication- started metformin XR 500 mg tab with dinner last night    5) Problem Solving- will start monitoring and exercise    6) Reducing Risk- takes medication for htn, hld    7) Healthy Coping- wife is supportive and brother is an MD; has this CDE's contact information    Resources-    Living With Type 2 Diabetes: Where Do I Begin? with focus on what is a carb and healthy plate meal plan    Diabetes Made Simple video    Plan-   1) continue taking metformin with dinner    2) start checking fasting blood sugar every morning    3) start exercising 3-4 days a week     4) follow healthy plate meal planning     5) call Emonni Depasquale at [phone] with blood sugars and with any questions or concerns    6) follow up with Dr. Cira Servant in August    SMART Goal- not set this visit  Future Appointments  Date Time Provider [place]   05/06/2017 9:00 AM PULMONARY LAB [facility]   07/22/2017 9:15 AM Seward Carol, MD [facility]     Last Appointment My Department:  04/17/2017    Chart was routed to Dr. Cira Servant.    Myer Haff, RN, CDE, CCM  (Phone) [phone]

## 2017-04-23 ENCOUNTER — Ambulatory Visit (INDEPENDENT_AMBULATORY_CARE_PROVIDER_SITE_OTHER): Payer: Medicare Other | Admitting: *Deleted

## 2017-04-23 DIAGNOSIS — R0602 Shortness of breath: Secondary | ICD-10-CM

## 2017-04-23 MED ORDER — BLOOD SUGAR DIAGNOSTIC TEST STRIPS
ORAL_STRIP | 2 refills | Status: DC
Start: 2017-04-23 — End: 2020-04-03

## 2017-04-23 NOTE — Progress Notes (Signed)
Pt here for BP check per 04/09/17 OV. PT says he has been compliant in increase of Toprol XL 50 mg daily. PT denies any symptoms today. BP today 118/78 HR 72. Will route to provider

## 2017-04-24 ENCOUNTER — Encounter: Payer: Self-pay | Admitting: Vascular Surgery

## 2017-04-24 NOTE — Progress Notes (Signed)
Please have patient increase Toprol XL to 75mg  daily please  J Mekisha Bittel MD

## 2017-04-29 ENCOUNTER — Encounter: Payer: Medicare Other | Admitting: Vascular Surgery

## 2017-04-30 NOTE — Addendum Note (Signed)
Addended by: Burman NievesASHWORTH, Marvene Strohm T on: 04/30/2017 10:33 AM   Modules accepted: Orders

## 2017-04-30 NOTE — Progress Notes (Signed)
Pt voiced understanding - updated medication list 

## 2017-05-06 ENCOUNTER — Inpatient Hospital Stay: Admit: 2017-05-06 | Payer: BLUE CROSS/BLUE SHIELD | Attending: Family Medicine | Primary: Family Medicine

## 2017-05-06 ENCOUNTER — Ambulatory Visit (INDEPENDENT_AMBULATORY_CARE_PROVIDER_SITE_OTHER): Payer: Medicare Other | Admitting: Vascular Surgery

## 2017-05-06 ENCOUNTER — Encounter: Payer: Self-pay | Admitting: Vascular Surgery

## 2017-05-06 VITALS — BP 99/63 | HR 64 | Temp 97.1°F | Resp 20 | Ht 70.0 in | Wt 184.0 lb

## 2017-05-06 DIAGNOSIS — R0602 Shortness of breath: Secondary | ICD-10-CM

## 2017-05-06 DIAGNOSIS — I70219 Atherosclerosis of native arteries of extremities with intermittent claudication, unspecified extremity: Secondary | ICD-10-CM

## 2017-05-06 DIAGNOSIS — F17210 Nicotine dependence, cigarettes, uncomplicated: Secondary | ICD-10-CM | POA: Diagnosis not present

## 2017-05-06 DIAGNOSIS — I255 Ischemic cardiomyopathy: Secondary | ICD-10-CM | POA: Diagnosis not present

## 2017-05-06 MED ORDER — ALBUTEROL SULFATE 0.083 % (0.83 MG/ML) SOLN FOR INHALATION
2.5 mg /3 mL (0.083 %) | RESPIRATORY_TRACT | Status: AC
Start: 2017-05-06 — End: 2017-05-06
  Administered 2017-05-06: 13:00:00 via RESPIRATORY_TRACT

## 2017-05-06 MED ORDER — ALBUTEROL SULFATE 0.083 % (0.83 MG/ML) SOLN FOR INHALATION
2.5 mg /3 mL (0.083 %) | RESPIRATORY_TRACT | Status: AC
Start: 2017-05-06 — End: 2017-05-06
  Administered 2017-05-06: 14:00:00 via RESPIRATORY_TRACT

## 2017-05-06 MED FILL — ALBUTEROL SULFATE 0.083 % (0.83 MG/ML) SOLN FOR INHALATION: 2.5 mg /3 mL (0.083 %) | RESPIRATORY_TRACT | Qty: 1

## 2017-05-06 NOTE — Progress Notes (Signed)
Patient name: Dave [name] MRN: [id number] DOB: 05-04-1956 Sex: male   REASON FOR CONSULT:    Peripheral vascular disease. The consult is requested by Dr. Sherril Croon  HPI:   Dave [name] is a 61 y.o. male, with a long history of bilateral lower extremity pain. He experiences paresthesias and pain in both lower extremities which occurs with ambulation and relieved with rest. This does not occur when he simply standing or sitting. This is not limited to calf claudication but involves his entire lower extremities. He also has buttock claudication. He denies any history of rest pain or nonhealing ulcers.  His risk factors for peripheral vascular disease include hypertension and smoking. He smokes 1 pack per day of cigarettes and has been smoking for 40 years.  I have reviewed the records from the referring office. The most recent encounter on 04/03/2017 shows that the patient was having leg pain which prompted a duplex scan. This showed moderate diffuse disease of the right lower extremity. ABI on the right was 60% an ABI on the left was 79%. The patient is sent for vascular consultation. The patient has had some back pain and is felt to also have some degenerative disc disease.  Past Medical History:  Diagnosis Date  . Anterior myocardial infarction (HCC) 06/2009   [name] 12/16/2016  . Anxiety   . Anxiety disorder   . Chest pain   . Chronic brain syndrome   . Elevated blood pressure   . Tobacco abuse     Family History  Problem Relation Age of Onset  . Coronary artery disease Unknown     SOCIAL HISTORY: Social History   Social History  . Marital status: Married    Spouse name: N/A  . Number of children: N/A  . Years of education: N/A   Occupational History  . Not on file.   Social History Main Topics  . Smoking status: Current Some Day Smoker    Packs/day: 1.00    Years: 44.00  . Smokeless tobacco: Never Used  . Alcohol use No  . Drug use: Yes    Types: Marijuana   Comment: 12/18/2016 "experimented when I was younger with different things; now only smoke marijuana"  . Sexual activity: Yes   Other Topics Concern  . Not on file   Social History Narrative  . No narrative on file    No Known Allergies  Current Outpatient Prescriptions  Medication Sig Dispense Refill  . albuterol (PROVENTIL) (2.5 MG/3ML) 0.083% nebulizer solution Inhale 2.5 mg into the lungs every 4 (four) hours as needed for wheezing or shortness of breath.   99  . aspirin EC 81 MG tablet Take 81 mg by mouth daily.    Marland Kitchen atorvastatin (LIPITOR) 80 MG tablet Take 1 tablet (80 mg total) by mouth daily. 90 tablet 3  . furosemide (LASIX) 20 MG tablet TAKE 1 TAB DAILY AS NEEDED FOR SWELLING 90 tablet 3  . lisinopril (PRINIVIL,ZESTRIL) 2.5 MG tablet Take 1 tablet (2.5 mg total) by mouth daily. 30 tablet 5  . metoprolol succinate (TOPROL-XL) 50 MG 24 hr tablet Take 75 mg by mouth daily. Take with or immediately following a meal.    . nitroGLYCERIN (NITROSTAT) 0.4 MG SL tablet Place 1 tablet (0.4 mg total) under the tongue every 5 (five) minutes as needed for chest pain. 25 tablet 2  . PROAIR HFA 108 (90 Base) MCG/ACT inhaler Inhale 2 puffs into the lungs 4 (four) times daily as needed for shortness  of breath.     . ticagrelor (BRILINTA) 90 MG TABS tablet Take 1 tablet (90 mg total) by mouth 2 (two) times daily. 60 tablet 10   No current facility-administered medications for this visit.     REVIEW OF SYSTEMS:  [X]  denotes positive finding, [ ]  denotes negative finding Cardiac  Comments:  Chest pain or chest pressure:    Shortness of breath upon exertion: X   Short of breath when lying flat: X   Irregular heart rhythm:        Vascular    Pain in calf, thigh, or hip brought on by ambulation: X   Pain in feet at night that wakes you up from your sleep:     Blood clot in your veins:    Leg swelling:         Pulmonary    Oxygen at home:    Productive cough:     Wheezing:           Neurologic    Sudden weakness in arms or legs:     Sudden numbness in arms or legs:     Sudden onset of difficulty speaking or slurred speech:    Temporary loss of vision in one eye:     Problems with dizziness:         Gastrointestinal    Blood in stool:     Vomited blood:         Genitourinary    Burning when urinating:     Blood in urine:        Psychiatric    Major depression:         Hematologic    Bleeding problems:    Problems with blood clotting too easily:        Skin    Rashes or ulcers:        Constitutional    Fever or chills:     PHYSICAL EXAM:   Vitals:   05/06/17 1012  BP: 99/63  Pulse: 64  Resp: 20  Temp: 97.1 F (36.2 C)  TempSrc: Oral  SpO2: 96%  Weight: 184 lb (83.5 kg)  Height: 5\' 10"  (1.778 m)    GENERAL: The patient is a well-nourished male, in no acute distress. The vital signs are documented above. CARDIAC: There is a regular rate and rhythm.  VASCULAR: He has bilateral carotid bruits. On the right side has a palpable femoral pulse and a palpable posterior tibial pulse. I cannot palpate a dorsalis pedis or popliteal pulse on the right. On the left side is a palpable femoral pulse. I cannot palpate a popliteal or pedal pulses. He has no significant lower extremity swelling. PULMONARY: There is good air exchange bilaterally without wheezing or rales. ABDOMEN: Soft and non-tender with normal pitched bowel sounds. I do not palpate an abdominal aortic aneurysm. MUSCULOSKELETAL: There are no major deformities or cyanosis. NEUROLOGIC: No focal weakness or paresthesias are detected. SKIN: There are no ulcers or rashes noted. PSYCHIATRIC: The patient has a normal affect.  DATA:    LOWER EXTREMITY ARTERIAL ULTRASOUND: I have reviewed the lower extremity arterial ultrasound that was done at the referring office on 04/03/2017.  On the right side there was monophasic waveforms in the common femoral artery throughout the popliteal artery. There  was a monophasic posterior tibial signal with a biphasic anterior tibial signal. ABI on the right was 60%.  On the left side there were biphasic signals in the foot with an ABI of 79%.  MEDICAL ISSUES:   PERIPHERAL VASCULAR DISEASE: Although he has palpable femoral pulses, he may have some underlying aortoiliac occlusive disease that is only unmasked when he exercises. He has not had a formal arterial exercise test. There could be some component of his symptoms related to degenerative disc disease although his symptoms do not occur at rest. Regardless, I would start with a conservative approach to these symptoms and have recommended tobacco cessation. We have spent more than 3 minutes on this conversation. I have also discussed the importance of a structured walking program and have discussed nutrition with him. I've ordered follow up ABIs in 6 months and I'll see him back at that time. Certainly if his symptoms progress we could consider arteriography.  BILATERAL CAROTID BRUITS: I did detect bilateral carotid bruits today. We were unable to get the carotid duplex scan in our office today and I have discussed this with his daughter. They will try to arrange to have Dr. Sherril Croon arrange for a carotid duplex scan in [place].   Waverly Ferrari [facility] [phone]

## 2017-05-06 NOTE — Progress Notes (Signed)
Call attempted to patient, there was no answer. Left a voice mail message requesting a return call to review PFT results and recommendations.

## 2017-05-06 NOTE — Progress Notes (Signed)
Please inform the patient his PFT result shows signs of obstruction. He can get a sample of Advair. Also please give him the contact number for pulmonology.

## 2017-05-06 NOTE — Procedures (Signed)
[facility]  PULMONARY FUNCTION    Tamala Julianame:Bonini, Hemi W.  MR#: [id number]  DOB: [birth year]  ACCOUNT #: [id number]   DATE OF SERVICE: 05/06/2017    CLINICAL INDICATION:  Shortness of breath.    Spirometry and bronchodilator studies were performed.  Spirometry reveals severe airflow obstruction.  The vital capacity is moderately reduced which could be secondary to air trapping or recurrent restrictive physiology.  Lung volumes are recommended if clinically indicated.  There is no significant improvement in the vital capacity or FEV1 with additional bronchodilators.  The flow volume loop is consistent with an obstructive pattern.      Jhonnie GarnerSHAWN C. Jimi Schappert, MD       SCM / SN  D: 05/07/2017 11:13     T: 05/07/2017 11:54  JOB #: [id number]

## 2017-05-07 NOTE — Procedures (Signed)
[facility]  PULMONARY FUNCTION    Name:[name], Anthony W.  MR#: [id number]  DOB: 11/04/1956  ACCOUNT #: [id number]   DATE OF SERVICE: 05/06/2017    CLINICAL INDICATION:  Shortness of breath.    Spirometry and bronchodilator studies were performed.  Spirometry reveals severe airflow obstruction.  The vital capacity is moderately reduced which could be secondary to air trapping or recurrent restrictive physiology.  Lung volumes are recommended if clinically indicated.  There is no significant improvement in the vital capacity or FEV1 with additional bronchodilators.  The flow volume loop is consistent with an obstructive pattern.      Genaro Bekker C. Lorah Kalina, MD       SCM / SN  D: 05/07/2017 11:13     T: 05/07/2017 11:54  JOB #: [id number]

## 2017-05-08 NOTE — Telephone Encounter (Signed)
Pt is returning a call to Diane. Best contact: [phone]       Message [facility] & [facility] from [facility]

## 2017-05-08 NOTE — Progress Notes (Signed)
Chart reviewed. Reached out to pt regarding diabetes self management. Left message for pt to call back regarding how his self management is going. A1c on 04/17/17 was 7.8%    Pt stated things are going really well. He has dropped another 8 pounds. His eating is going well. His FSBS are ranging 110-128, with most 116-118. He is taking one metformin ER with dinner. Pt has not started going back to the gym yet, but has it on his mind. Will call pt again before his next appt with Dr. Emilee HeroLatham in August.     Key Antihyperglycemic Medications             metFORMIN ER (GLUCOPHAGE XR) 500 mg tablet Take 1 Tab by mouth daily (with dinner).        Future Appointments  Date Time Provider Department Center   07/22/2017 9:15 AM Daine Florasuth C [facility]atham, MD [id number]      Last Appointment My Department:  04/22/2017

## 2017-05-08 NOTE — Telephone Encounter (Signed)
See Pt Outreach encounter dated today 05/08/17 for more info.

## 2017-05-14 ENCOUNTER — Ambulatory Visit: Payer: Medicare Other | Admitting: Cardiology

## 2017-05-14 NOTE — Progress Notes (Deleted)
Clinical Summary Mr. Boettcher is a 61 y.o.male seen today for follow up of the following medical problems.   1. CAD/Chronic systolic HF - 06/2009 CABG x 3 (LIMA-LAD, SVG-diag, SVG-PDA). He presented with an anterior MI.  - from prior clinic notes, LVEF 40% (do not see echo report) at that time.  - recent issues with severe SOB and DOE -11/2016 echo that showed LVEF 15%, diffuse hypokinesis.  - Jan 2018 cath as reported below, received DES to distal SVG-rPDA. RHC with CI 2, mean PA 39, PCWP 29, LVEDP 22 - Discharge weight 191 lbs. Today weight is 181 lbs.  - not interested in cardiac rehab.   .  - last visit we incrased Toprll XL to 37.5 mg daily. No new side effects - no SOB/DOE, no LE edema. NO chest pain.   - last visit increased Toprol XL to 75mg  daily   2. COPD - diagnosis mentioned in chart, however he is unaware of PFTs in the past   3. NSVT - denies any recent palpitations since last visit   4. Carotid stenosis  - moderate by [place] Jan 2018, asymptomatic  5. PAD -followed by vascular  Past Medical History:  Diagnosis Date  . Anterior myocardial infarction (HCC) 06/2009   [name] 12/16/2016  . Anxiety   . Anxiety disorder   . Chest pain   . Chronic brain syndrome   . Elevated blood pressure   . Tobacco abuse      No Known Allergies   Current Outpatient Prescriptions  Medication Sig Dispense Refill  . albuterol (PROVENTIL) (2.5 MG/3ML) 0.083% nebulizer solution Inhale 2.5 mg into the lungs every 4 (four) hours as needed for wheezing or shortness of breath.   99  . aspirin EC 81 MG tablet Take 81 mg by mouth daily.    Marland Kitchen atorvastatin (LIPITOR) 80 MG tablet Take 1 tablet (80 mg total) by mouth daily. 90 tablet 3  . furosemide (LASIX) 20 MG tablet TAKE 1 TAB DAILY AS NEEDED FOR SWELLING 90 tablet 3  . lisinopril (PRINIVIL,ZESTRIL) 2.5 MG tablet Take 1 tablet (2.5 mg total) by mouth daily. 30 tablet 5  . metoprolol succinate (TOPROL-XL) 50 MG 24  hr tablet Take 75 mg by mouth daily. Take with or immediately following a meal.    . nitroGLYCERIN (NITROSTAT) 0.4 MG SL tablet Place 1 tablet (0.4 mg total) under the tongue every 5 (five) minutes as needed for chest pain. 25 tablet 2  . PROAIR HFA 108 (90 Base) MCG/ACT inhaler Inhale 2 puffs into the lungs 4 (four) times daily as needed for shortness of breath.     . ticagrelor (BRILINTA) 90 MG TABS tablet Take 1 tablet (90 mg total) by mouth 2 (two) times daily. 60 tablet 10   No current facility-administered medications for this visit.      Past Surgical History:  Procedure Laterality Date  . CARDIAC CATHETERIZATION  06/2009   [name] 12/16/2016  . CARDIAC CATHETERIZATION N/A 12/18/2016   Procedure: Right/Left Heart Cath and Coronary/Graft Angiography;  Surgeon: Yvonne Kendall, MD;  Location: [facility] INVASIVE CV LAB;  Service: Cardiovascular;  Laterality: N/A;  . CARDIAC CATHETERIZATION N/A 12/18/2016   Procedure: Coronary Stent Intervention;  Surgeon: Yvonne Kendall, MD;  Location: [facility] INVASIVE CV LAB;  Service: Cardiovascular;  Laterality: N/A;  SVG-PDA  . CORONARY ANGIOPLASTY    . CORONARY ARTERY BYPASS GRAFT  2010   X3  . LAPAROSCOPIC CHOLECYSTECTOMY  2007     No Known Allergies  Family History  Problem Relation Age of Onset  . Coronary artery disease Unknown      Social History Mr. Kauffmann reports that he has been smoking.  He has a 44.00 pack-year smoking history. He has never used smokeless tobacco. Mr. Thoma reports that he does not drink alcohol.   Review of Systems CONSTITUTIONAL: No weight loss, fever, chills, weakness or fatigue.  HEENT: Eyes: No visual loss, blurred vision, double vision or yellow sclerae.No hearing loss, sneezing, congestion, runny nose or sore throat.  SKIN: No rash or itching.  CARDIOVASCULAR:  RESPIRATORY: No shortness of breath, cough or sputum.  GASTROINTESTINAL: No anorexia, nausea, vomiting or diarrhea. No abdominal pain or blood.    GENITOURINARY: No burning on urination, no polyuria NEUROLOGICAL: No headache, dizziness, syncope, paralysis, ataxia, numbness or tingling in the extremities. No change in bowel or bladder control.  MUSCULOSKELETAL: No muscle, back pain, joint pain or stiffness.  LYMPHATICS: No enlarged nodes. No history of splenectomy.  PSYCHIATRIC: No history of depression or anxiety.  ENDOCRINOLOGIC: No reports of sweating, cold or heat intolerance. No polyuria or polydipsia.  Marland Kitchen   Physical Examination There were no vitals filed for this visit. There were no vitals filed for this visit.  Gen: resting comfortably, no acute distress HEENT: no scleral icterus, pupils equal round and reactive, no palptable cervical adenopathy,  CV Resp: Clear to auscultation bilaterally GI: abdomen is soft, non-tender, non-distended, normal bowel sounds, no hepatosplenomegaly MSK: extremities are warm, no edema.  Skin: warm, no rash Neuro:  no focal deficits Psych: appropriate affect   Diagnostic Studies 06/2009 cath  IMPRESSION: 1. Severe global left ventricular dysfunction with an ejection fraction of approximately 25% with significant hypocontractility to akinesis involving an inferior wall hypocontractility anterolaterally and significant hypocontractility in the inferolateral wall. 2. Significant multivessel coronary obstructive disease with separate ostium giving rise to the left anterior descending and left circumflex vessels with diffuse 70% ostial proximal left anterior descending stenosis, 60% left anterior descending stenosis after the takeoff of the first septal and diagonal vessel and bifurcation 95% and 80% septal mid left anterior descending stenosis. 3. Large circumflex system with questionable ostial catheter spasm. 4. Total occlusion of the right coronary artery with extensive left-to- right collaterals. 5. An 80% left vertebral artery  narrowing arising from the left circumflex system with evidence for plaque in the proximal subclavian system with a widely patent unbypassed left internal mammary artery. 6. 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  RECOMMENDATIONS: The patient will be evaluated for consideration of CABG revascularization surgery.   Jan 2018 cath Conclusions: 1. Significant 2-vessel native coronary artery disease, including moderate diffuse proximal LAD disease and chronic total occlusion of mid LAD and ostial RCA. 70% proximal D1 stenosis is also present. 2. Moderate, non-obstructive disease involving LCx. Small Vinnie Gombert of OM2 is chronically occluded. 3. Widely patent LIMA->LAD. 4. Patent SVG->rPDA with mild diffuse disease and discrete 95% stenosis at the distal anastomosis. 5. Chronically occluded SVG->D1. 6. Mildly elevated left and right heart filling pressures, as well as moderate pulmonary hypertension. 7. Normal to mildly reduced cardiac output. 8. Successful PCI to distal SVG->rPDA anastomosis using a Resolute Onxy 4.0 x 12 mm drug-eluting stent with 0% residual stenosis and TIMI-3 flow.  Recommendations: 1. Admit for overnight observation following PCI with severe systolic dysfunction. 2. Dual antiplatelet therapy with aspirin and ticagrelor for at least 6-12 months, ideally indefinitely. If patient experiences respiratory side effects from ticagrelor, he  could be switched to clopidogrel. 3. Avoid further IV hydration today; restart diuresis tomorrow if renal function allows. 4. Aggressive medical therapy for severe ischemic cardiomyopathy. If renal function stable, can consider starting ACEI tomorrow. 5. Close outpatient follow-up with Dr. Wyline MoodBranch.    Assessment and Plan  . CAD/ICM/Chronic systolic HF - recent echo shows LVEF 15% a new finding for the patient - referred for cath,  he received a stent to his SVG-rPDA - continue DAPT at least 1 year  - we will increase Toprl XL to 50mg  daily. Nursing visit with vitals check in 2 weeks, pending results further titrate meds  2. NSVT - no symptoms - continue beta blocker  3. Carotid stenosis - we will repeat carotid US next year   F/u 1 month      Antoine PocheJonathan F. Sanad Fearnow, M.D., F.A.C.C.

## 2017-05-15 ENCOUNTER — Encounter: Payer: Self-pay | Admitting: Cardiology

## 2017-06-02 NOTE — Telephone Encounter (Signed)
Call completed to patient, two identifiers verified. Patient advised PFT result shows signs of obstruction and a follow-up with Pulmonary is recommended. Patient agrees with recommendations and provided contact information for [facility] [place]

## 2017-06-02 NOTE — Telephone Encounter (Signed)
#  [phone] pt returning call from 06-01-17

## 2017-06-30 ENCOUNTER — Other Ambulatory Visit: Payer: Self-pay | Admitting: Cardiology

## 2017-07-16 ENCOUNTER — Encounter

## 2017-07-17 MED ORDER — CHANTIX 1 MG TABLET
1 mg | ORAL_TABLET | ORAL | 0 refills | Status: DC
Start: 2017-07-17 — End: 2017-09-20

## 2017-07-22 ENCOUNTER — Ambulatory Visit: Admit: 2017-07-22 | Payer: PRIVATE HEALTH INSURANCE | Attending: Family Medicine | Primary: Family Medicine

## 2017-07-22 DIAGNOSIS — I10 Essential (primary) hypertension: Secondary | ICD-10-CM

## 2017-07-22 MED ORDER — CYCLOBENZAPRINE 10 MG TAB
10 mg | ORAL_TABLET | Freq: Three times a day (TID) | ORAL | 1 refills | Status: DC | PRN
Start: 2017-07-22 — End: 2017-10-31

## 2017-07-22 NOTE — ACP (Advance Care Planning) (Signed)
No Advanced Care Plan in place.  Honoring Choices given.

## 2017-07-22 NOTE — ACP (Advance Care Planning) (Signed)
No Advanced Care Plan in place.  Honoring Choices given.

## 2017-07-22 NOTE — Addendum Note (Signed)
Addended by: Jacques NavyMURRAY, Jacub Waiters B on: 07/22/2017 04:54 PM      Modules accepted: Orders, SmartSet

## 2017-07-22 NOTE — Progress Notes (Signed)
SUBJECTIVE:   [name] is a 61 y.o. male who is here for follow up of routine medical issues.      Pt c/o persistent numbness in his left foot, towards his toes. Pt reports the numbness is improved with yoga, and reports it coordinates with his left hip pain.    Pt c/o urinary frequency. He reports increased coffee consumption in the AM.     Pt reports being prescribed flexeril (10mg ) by his orthopedic for back spasms.     Pt reports he stopped smoking this year. Pt notes he relapsed three weeks ago during a stressful period, but has since quit again.    Pt reports increased allergies in Fall/Spring.     Pt is compliant with his cpap.    DM: Pt is compliant in taking metformin er. Patient denies fatigue, dizziness, polydipsia, polyphagia, pancreatitis, increased sugar consumption, sore/bleeding gums, blurred vision, or cuts that will not heal. Pt endorses checking his blood glucose at home. Pt endorses regular exercise.     HTN: Pt is compliant in taking toprol-xl. Patient denies chest pain, DOE/SOB, edema, headache, visual changes, dizziness, palpitations or syncope. Pt's BP in the office today was elevated at 146/86.    Pt notes an upcoming trip to [place] for a wedding at the end of September.     PREVENTIVE:  Colonoscopy: 12/24/16, Dr. Nedra Hai, f/u in 5 years  Pneumonia vaccine: accepted  Flu: pt will f/u this Fall   Eye Exam: current    At this time, he is otherwise doing well and has brought no other complaints to my attention today.  For a list of the medical issues addressed today, see the assessment and plan below.    PMH:   Past Medical History:   Diagnosis Date   ??? CAD (coronary artery disease)     heart attack 2011   ??? Chronic obstructive pulmonary disease (HCC)    ??? Hypercholesterolemia    ??? Hypertension      PSH:  has a past surgical history that includes pr cardiac surg procedure unlist.    All: has No Known Allergies.   MEDS:   Current Outpatient Prescriptions   Medication Sig    ??? cyclobenzaprine (FLEXERIL) 10 mg tablet Take 1 Tab by mouth three (3) times daily as needed for Muscle Spasm(s).   ??? CHANTIX 1 mg tablet TAKE 1 TABLET BY MOUTH TWICE A DAY FOR 60 DAYS   ??? glucose blood VI test strips (ACCU-CHEK SMARTVIEW TEST STRIP) strip Test blood sugar every morning fasting.   ??? glucose blood VI test strips (ACCU-CHEK SMARTVIEW TEST STRIP) strip by Does Not Apply route See Admin Instructions.   ??? metFORMIN ER (GLUCOPHAGE XR) 500 mg tablet Take 1 Tab by mouth daily (with dinner).   ??? metoprolol succinate (TOPROL-XL) 50 mg XL tablet Take 50 mg by mouth daily.   ??? rosuvastatin (CRESTOR) 10 mg tablet Take 10 mg by mouth nightly.   ??? omega-3 fatty acids-vitamin e 1,000 mg cap Take 1 Cap by mouth daily.   ??? aspirin delayed-release 81 mg tablet Take 81 mg by mouth daily.   ??? multivitamin (ONE A DAY) tablet Take 1 Tab by mouth daily.   ??? glucosamine-chondroitin (ARTHX) 500-400 mg cap Take 1 Cap by mouth daily.     No current facility-administered medications for this visit.        FH: family history includes Diabetes in his brother; Heart Disease in his father; Hypertension in his mother.  SH:  reports that he quit smoking about 5 months ago. He has a 40.00 pack-year smoking history. He has never used smokeless tobacco. He reports that he does not drink alcohol or use illicit drugs.     Review of Systems - History obtained from the patient  General ROS: no fever, chills, fatigue, body aches  Psychological ROS: no change in anxiety, depression, SI/HI  Ophthalmic ROS: no blurred vision, myopia, double vision  ENT ROS: no dysphagia, otalgia, otorrhea, rhinorrhea, post nasal drip  Respiratory ROS: no cough, shortness of breath, or wheezing  Cardiovascular ROS: no chest pain or dyspnea on exertion  Gastrointestinal ROS: no abdominal pain, change in bowel habits, or black or bloody stools  Genito-Urinary ROS: frequency no urgency, incontinence, dysuria, hematouria   Musculoskeletal ROS: back spasms, numbness of left foot no arthralagia, myalgia  Neurological ROS: no headaches, dizziness, lightheadedness, tremors, seizures  Dermatological ROS: no rash or lesions    OBJECTIVE:   Vitals:   Visit Vitals   ??? BP 146/86 (BP 1 Location: Left arm, BP Patient Position: Sitting)   ??? Pulse 61   ??? Temp 98.6 ??F (37 ??C) (Oral)   ??? Resp 18   ??? Ht 5\' 10"  (1.778 m)   ??? Wt 246 lb (111.6 kg)   ??? SpO2 95%   ??? BMI 35.3 kg/m2      Gen: Pleasant 61 y.o.  male in NAD.    HEENT: PERRLA. EOMI. OP moist and pink.    Neck: Supple.  No LAD.   HEART: RRR, No M/G/R.      LUNGS: CTAB No W/R.    ABDOMEN: S, NT, ND, BS+.     EXTREMITIES: Warm. No C/C/E.    MUSCULOSKELETAL: Normal ROM, muscle strength 5/5 all groups.    NEURO: Alert and oriented x 3.  Cranial nerves grossly intact.  No focal sensory or motor deficits noted.   SKIN: Warm. Dry. No rashes or other lesions noted.    ASSESSMENT/ PLAN: Diagnoses and all orders for this visit:    1. Essential hypertension  -     METABOLIC PANEL, COMPREHENSIVE    2. Controlled type 2 diabetes mellitus without complication, without long-term current use of insulin (HCC)  -     HEMOGLOBIN A1C WITH EAG  -     METABOLIC PANEL, COMPREHENSIVE    3. Tobacco abuse, in remission    4. Back muscle spasm  -     cyclobenzaprine (FLEXERIL) 10 mg tablet; Take 1 Tab by mouth three (3) times daily as needed for Muscle Spasm(s).        ICD-10-CM ICD-9-CM    1. Essential hypertension I10 401.9 METABOLIC PANEL, COMPREHENSIVE   2. Controlled type 2 diabetes mellitus without complication, without long-term current use of insulin (HCC) E11.9 250.00 HEMOGLOBIN A1C WITH EAG      METABOLIC PANEL, COMPREHENSIVE   3. Tobacco abuse, in remission F17.201 V15.82    4. Back muscle spasm M62.830 724.8 cyclobenzaprine (FLEXERIL) 10 mg tablet      1. Hypertension  I recommended continuing current dose of toprol-xl, eating a low sodium diet, and increasing exercise. Recheck CMP.    2. DM type 2   I advised pt to continue current dose of metformin er, avoid sugars and starches, and to increase exercise when possible. Recheck hemoglobin A1c and CMP.     3. Tobacco abuse  I recommended pt continue with current dose of chantix.    4. Back muscle spasm  I  refilled pt's flexeril for use prn.     Encounter for immunization  Pt is eligible for pneumococcal vaccination (administered today in the office).    Follow-up Disposition:  Return in about 3 months (around 10/22/2017) for follow up .    I have reviewed the patient's medications and risks/side effects/benefits were discussed. Diagnosis(-es) explained to patient and questions answered. Literature provided where appropriate.     Written by Marshell Garfinkel, [place], as dictated by Jackolyn Confer, MD.

## 2017-07-22 NOTE — Patient Instructions (Signed)
Vaccine Information Statement    Pneumococcal Polysaccharide Vaccine: What You Need to Know    Many Vaccine Information Statements are available in Spanish and other languages. See www.immunize.org/vis.  Hojas de informaci??n Sobre Vacunas est??n disponibles en espa??ol y en muchos otros idiomas. Visite http://www.immunize.org/vis.    1. Why get vaccinated?    Vaccination can protect older adults (and some children and younger adults) from pneumococcal disease.    Pneumococcal disease is caused by bacteria that can spread from person to person through close contact.  It can cause ear infections, and it can also lead to more serious infections of the:  ??? Lungs (pneumonia),  ??? Blood (bacteremia), and  ??? Covering of the brain and spinal cord (meningitis). Meningitis can cause deafness and brain damage, and it can be fatal.      Anyone can get pneumococcal disease, but children under 2 years of age, people with certain medical conditions, adults over 65 years of age, and cigarette smokers are at the highest risk.    About 18,000 older adults die each year from pneumococcal disease in the [place].    Treatment of pneumococcal infections with penicillin and other drugs used to be more effective. But some strains of the disease have become resistant to these drugs. This makes prevention of the disease, through vaccination, even more important.    2. Pneumococcal polysaccharide vaccine (PPSV23)    Pneumococcal polysaccharide vaccine (PPSV23) protects against 23 types of pneumococcal bacteria. It will not prevent all pneumococcal disease.    PPSV23 is recommended for:  ??? All adults 65 years of age and older,  ??? Anyone 2 through 59 years of age with certain long-term health problems,  ??? Anyone 2 through 59 years of age with a weakened immune system,  ??? Adults 19 through 59 years of age who smoke cigarettes or have asthma.     Most people need only one dose of PPSV.  A second dose is recommended for  certain high-risk groups.  People 65 and older should get a dose even if they have gotten one or more doses of the vaccine before they turned 65.    Your healthcare provider can give you more information about these recommendations.    Most healthy adults develop protection within 2 to 3 weeks of getting the shot.     3. Some people should not get this vaccine    ??? Anyone who has had a life-threatening allergic reaction to PPSV should not get another dose.    ??? Anyone who has a severe allergy to any component of PPSV should not receive it. Tell your provider if you have any severe allergies.    ??? Anyone who is moderately or severely ill when the shot is scheduled may be asked to wait until they recover before getting the vaccine. Someone with a mild illness can usually be vaccinated.    ??? Children less than 2 years of age should not receive this vaccine.    ??? There is no evidence that PPSV is harmful to either a pregnant woman or to her fetus. However, as a precaution, women who need the vaccine should be vaccinated before becoming pregnant, if possible.    4. Risks of a vaccine reaction    With any medicine, including vaccines, there is a chance of side effects. These are usually mild and go away on their own, but serious reactions are also possible.     About half of people who get PPSV   have mild side effects, such as redness or pain where the shot is given, which go away within about two days.    Less than 1 out of 100 people develop a fever, muscle aches, or more severe local reactions.    Problems that could happen after any vaccine:    ??? People sometimes faint after a medical procedure, including vaccination. Sitting or lying down for about 15 minutes can help prevent fainting, and injuries caused by a fall. Tell your doctor if you feel dizzy, or have vision changes or ringing in the ears.    ??? Some people get severe pain in the shoulder and have difficulty moving  the arm where a shot was given. This happens very rarely.    ??? Any medication can cause a severe allergic reaction. Such reactions from a vaccine are very rare, estimated at about 1 in a million doses, and would happen within a few minutes to a few hours after the vaccination.     As with any medicine, there is a very remote chance of a vaccine causing a serious injury or death.    The safety of vaccines is always being monitored.  For more information, visit: www.cdc.gov/vaccinesafety/     5. What if there is a serious reaction?    What should I look for?    Look for anything that concerns you, such as signs of a severe allergic reaction, very high fever, or unusual behavior.    Signs of a severe allergic reaction can include hives, swelling of the face and throat, difficulty breathing, a fast heartbeat, dizziness, and weakness. These would usually start a few minutes to a few hours after the vaccination.    What should I do?    If you think it is a severe allergic reaction or other emergency that can???t wait, call 9-1-1 or get to the nearest hospital. Otherwise, call your doctor.    Afterward, the reaction should be reported to the Vaccine Adverse Event Reporting System (VAERS). Your doctor might file this report, or you can do it yourself through the VAERS web site at www.vaers.hhs.gov, or by calling 1-800-822-7967.     VAERS does not give medical advice.    6. How can I learn more?    ??? Ask your doctor. He or she can give you the vaccine package insert or suggest other sources of information.  ??? Call your local or state health department.  ??? Contact the Centers for Disease Control and Prevention (CDC):  - Call 1-800-232-4636 (1-800-CDC-INFO) or  - Visit CDC???s website at www.cdc.gov/vaccines    Vaccine Information Statement   PPSV   03/31/2014    Department of Health and Human Services  Centers for Disease Control and Prevention    Office Use Only

## 2017-07-22 NOTE — Progress Notes (Signed)
1. Have you been to the ER,No urgent care clinic since your last visit?No  Hospitalized since your last visit?No    2. Have you seen or consulted any other health care providers outside of the [facility] since your last visit?No  Include any pap smears or colon screening.  Colonoscopy in 2018.

## 2017-07-23 LAB — METABOLIC PANEL, COMPREHENSIVE
A-G Ratio: 2.2 (ref 1.2–2.2)
ALT (SGPT): 25 IU/L (ref 0–44)
AST (SGOT): 20 IU/L (ref 0–40)
Albumin: 4.8 g/dL (ref 3.5–5.5)
Alk. phosphatase: 53 IU/L (ref 39–117)
BUN/Creatinine ratio: 22 — ABNORMAL HIGH (ref 9–20)
BUN: 17 mg/dL (ref 6–24)
Bilirubin, total: 0.3 mg/dL (ref 0.0–1.2)
CO2: 23 mmol/L (ref 20–29)
Calcium: 9.5 mg/dL (ref 8.7–10.2)
Chloride: 100 mmol/L (ref 96–106)
Creatinine: 0.76 mg/dL (ref 0.76–1.27)
GFR est AA: 115 mL/min/{1.73_m2} (ref >59)
GFR est non-AA: 100 mL/min/{1.73_m2} (ref >59)
GLOBULIN, TOTAL: 2.2 g/dL (ref 1.5–4.5)
Glucose: 127 mg/dL — ABNORMAL HIGH (ref 65–99)
Potassium: 4.7 mmol/L (ref 3.5–5.2)
Protein, total: 7 g/dL (ref 6.0–8.5)
Sodium: 138 mmol/L (ref 134–144)

## 2017-07-23 LAB — HEMOGLOBIN A1C WITH EAG
Estimated average glucose: 148 mg/dL
Hemoglobin A1c: 6.8 % — ABNORMAL HIGH (ref 4.8–5.6)

## 2017-07-24 ENCOUNTER — Encounter

## 2017-07-24 NOTE — Progress Notes (Signed)
CMP-Normal electrolyte levels except for an elevation in the glucose level, normal renal and liver function  A1c-Your current hgbA1c is lower than your last level of 7.0.  Keep up the good work!  Continue to work on following a diabetic diet and exercise.  Recheck this test: hgbA1c  in  3 months.  Continue with current  medications.

## 2017-07-24 NOTE — Progress Notes (Signed)
Patient has graduated from the Disease Specific Care Management  program on 07/24/17-new T2DM.  Pt's A1c was 6.8% on 07/23/17. 04/17/17 A1c was 7.8%.  Lab Results   Component Value Date/Time    Hemoglobin A1c 6.8 (H) 07/22/2017 10:20 AM    Hemoglobin A1c 7.0 (H) 01/14/2017 08:54 AM    Hemoglobin A1c 6.5 (H) 12/26/2015 10:57 AM     Patient's symptoms are stable at this time.  Patient/family has the ability to self-manage.   Care management goals have been completed at this time. No further nurse navigator follow up scheduled.    Goals Addressed     None          Pt has nurse navigator's contact information for any further questions, concerns, or needs.  Patients upcoming visits:  Future Appointments  Date Time Provider Department Center   07/30/2017 2:00 PM [facility] ER 1 [facility]

## 2017-07-30 ENCOUNTER — Inpatient Hospital Stay: Admit: 2017-07-30 | Payer: BLUE CROSS/BLUE SHIELD | Attending: Critical Care Medicine | Primary: Family Medicine

## 2017-07-30 ENCOUNTER — Ambulatory Visit (INDEPENDENT_AMBULATORY_CARE_PROVIDER_SITE_OTHER): Payer: Medicare Other | Admitting: Cardiology

## 2017-07-30 ENCOUNTER — Encounter: Payer: Self-pay | Admitting: Cardiology

## 2017-07-30 VITALS — BP 116/68 | HR 76 | Ht 70.0 in | Wt 184.8 lb

## 2017-07-30 DIAGNOSIS — Z136 Encounter for screening for cardiovascular disorders: Secondary | ICD-10-CM

## 2017-07-30 DIAGNOSIS — I472 Ventricular tachycardia: Secondary | ICD-10-CM | POA: Diagnosis not present

## 2017-07-30 DIAGNOSIS — I5022 Chronic systolic (congestive) heart failure: Secondary | ICD-10-CM | POA: Diagnosis not present

## 2017-07-30 DIAGNOSIS — I2510 Atherosclerotic heart disease of native coronary artery without angina pectoris: Secondary | ICD-10-CM

## 2017-07-30 DIAGNOSIS — I6523 Occlusion and stenosis of bilateral carotid arteries: Secondary | ICD-10-CM

## 2017-07-30 DIAGNOSIS — I255 Ischemic cardiomyopathy: Secondary | ICD-10-CM | POA: Diagnosis not present

## 2017-07-30 DIAGNOSIS — I4729 Other ventricular tachycardia: Secondary | ICD-10-CM

## 2017-07-30 MED ORDER — METOPROLOL SUCCINATE ER 100 MG PO TB24: 100.0000 mg | ORAL_TABLET | Freq: Every day | ORAL | 1 refills | Status: DC

## 2017-07-30 MED ORDER — LISINOPRIL 5 MG PO TABS: 5.0000 mg | ORAL_TABLET | Freq: Every day | ORAL | 1 refills | Status: DC

## 2017-07-30 NOTE — Progress Notes (Signed)
Clinical Summary Mr. Fehrman is a 61 y.o.male seen today for follow up of the following medical problems.   1. CAD/Chronic systolic HF - 06/2009 CABG x 3 (LIMA-LAD, SVG-diag, SVG-PDA). He presented with an anterior MI.  - from prior clinic notes, LVEF 40% (do not see echo report) at that time.  - recent issues with severe SOB and DOE -11/2016 echo that showed LVEF 15%, diffuse hypokinesis.  - Jan 2018 cath as reported below, received DES to distal SVG-rPDA. RHC with CI 2, mean PA 39, PCWP 29, LVEDP 22 - Discharge weight 191 lbs. Today weight is 181 lbs.  - not interested in cardiac rehab.     - last visit we incrased Toprll XL to 37.5 mg daily. No new side effects - no SOB/DOE, no LE edema. NO chest pain.    2. COPD - diagnosis mentioned in chart, however he is unaware of PFTs in the past   3. NSVT - no recent palpitatoins   4. Carotid stenosis  - moderate by [place] Jan 2018 - no recent neurological symptoms   Past Medical History:  Diagnosis Date  . Anterior myocardial infarction (HCC) 06/2009   [name] 12/16/2016  . Anxiety   . Anxiety disorder   . Chest pain   . Chronic brain syndrome   . Elevated blood pressure   . Tobacco abuse      No Known Allergies   Current Outpatient Prescriptions  Medication Sig Dispense Refill  . albuterol (PROVENTIL) (2.5 MG/3ML) 0.083% nebulizer solution Inhale 2.5 mg into the lungs every 4 (four) hours as needed for wheezing or shortness of breath.   99  . aspirin EC 81 MG tablet Take 81 mg by mouth daily.    Marland Kitchen atorvastatin (LIPITOR) 80 MG tablet Take 1 tablet (80 mg total) by mouth daily. 90 tablet 3  . furosemide (LASIX) 20 MG tablet TAKE 1 TAB DAILY AS NEEDED FOR SWELLING 90 tablet 3  . lisinopril (PRINIVIL,ZESTRIL) 2.5 MG tablet TAKE ONE TABLET BY MOUTH DAILY. 30 tablet 6  . metoprolol succinate (TOPROL-XL) 50 MG 24 hr tablet Take 75 mg by mouth daily. Take with or immediately following a meal.    . nitroGLYCERIN  (NITROSTAT) 0.4 MG SL tablet Place 1 tablet (0.4 mg total) under the tongue every 5 (five) minutes as needed for chest pain. 25 tablet 2  . PROAIR HFA 108 (90 Base) MCG/ACT inhaler Inhale 2 puffs into the lungs 4 (four) times daily as needed for shortness of breath.     . ticagrelor (BRILINTA) 90 MG TABS tablet Take 1 tablet (90 mg total) by mouth 2 (two) times daily. 60 tablet 10   No current facility-administered medications for this visit.      Past Surgical History:  Procedure Laterality Date  . CARDIAC CATHETERIZATION  06/2009   [name] 12/16/2016  . CARDIAC CATHETERIZATION N/A 12/18/2016   Procedure: Right/Left Heart Cath and Coronary/Graft Angiography;  Surgeon: Yvonne Kendall, MD;  Location: [facility] INVASIVE CV LAB;  Service: Cardiovascular;  Laterality: N/A;  . CARDIAC CATHETERIZATION N/A 12/18/2016   Procedure: Coronary Stent Intervention;  Surgeon: Yvonne Kendall, MD;  Location: [facility] INVASIVE CV LAB;  Service: Cardiovascular;  Laterality: N/A;  SVG-PDA  . CORONARY ANGIOPLASTY    . CORONARY ARTERY BYPASS GRAFT  2010   X3  . LAPAROSCOPIC CHOLECYSTECTOMY  2007     No Known Allergies    Family History  Problem Relation Age of Onset  . Coronary artery disease Unknown  Social History Mr. Cheatum reports that he has been smoking.  He has a 44.00 pack-year smoking history. He has never used smokeless tobacco. Mr. Snee reports that he does not drink alcohol.   Review of Systems CONSTITUTIONAL: No weight loss, fever, chills, weakness or fatigue.  HEENT: Eyes: No visual loss, blurred vision, double vision or yellow sclerae.No hearing loss, sneezing, congestion, runny nose or sore throat.  SKIN: No rash or itching.  CARDIOVASCULAR: per hpi RESPIRATORY: per hpi  GASTROINTESTINAL: No anorexia, nausea, vomiting or diarrhea. No abdominal pain or blood.  GENITOURINARY: No burning on urination, no polyuria NEUROLOGICAL: No headache, dizziness, syncope, paralysis, ataxia, numbness  or tingling in the extremities. No change in bowel or bladder control.  MUSCULOSKELETAL: No muscle, back pain, joint pain or stiffness.  LYMPHATICS: No enlarged nodes. No history of splenectomy.  PSYCHIATRIC: No history of depression or anxiety.  ENDOCRINOLOGIC: No reports of sweating, cold or heat intolerance. No polyuria or polydipsia.  Marland Kitchen   Physical Examination Vitals:   07/30/17 1352  BP: 116/68  Pulse: 76  SpO2: 96%   Vitals:   07/30/17 1352  Weight: 184 lb 12.8 oz (83.8 kg)  Height: 5\' 10"  (1.778 m)    Gen: resting comfortably, no acute distress HEENT: no scleral icterus, pupils equal round and reactive, no palptable cervical adenopathy,  CV: RRR, no m/r/g, no jvd Resp: Clear to auscultation bilaterally GI: abdomen is soft, non-tender, non-distended, normal bowel sounds, no hepatosplenomegaly MSK: extremities are warm, no edema.  Skin: warm, no rash Neuro:  no focal deficits Psych: appropriate affect   Diagnostic Studies  06/2009 cath  IMPRESSION: 1. Severe global left ventricular dysfunction with an ejection fraction of approximately 25% with significant hypocontractility to akinesis involving an inferior wall hypocontractility anterolaterally and significant hypocontractility in the inferolateral wall. 2. Significant multivessel coronary obstructive disease with separate ostium giving rise to the left anterior descending and left circumflex vessels with diffuse 70% ostial proximal left anterior descending stenosis, 60% left anterior descending stenosis after the takeoff of the first septal and diagonal vessel and bifurcation 95% and 80% septal mid left anterior descending stenosis. 3. Large circumflex system with questionable ostial catheter spasm. 4. Total occlusion of the right coronary artery with extensive left-to- right collaterals. 5. An 80% left vertebral artery narrowing arising from the  left circumflex system with evidence for plaque in the proximal subclavian system with a widely patent unbypassed left internal mammary artery. 6. 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  RECOMMENDATIONS: The patient will be evaluated for consideration of CABG revascularization surgery.   Jan 2018 cath Conclusions: 1. Significant 2-vessel native coronary artery disease, including moderate diffuse proximal LAD disease and chronic total occlusion of mid LAD and ostial RCA. 70% proximal D1 stenosis is also present. 2. Moderate, non-obstructive disease involving LCx. Small Jovanie Verge of OM2 is chronically occluded. 3. Widely patent LIMA->LAD. 4. Patent SVG->rPDA with mild diffuse disease and discrete 95% stenosis at the distal anastomosis. 5. Chronically occluded SVG->D1. 6. Mildly elevated left and right heart filling pressures, as well as moderate pulmonary hypertension. 7. Normal to mildly reduced cardiac output. 8. Successful PCI to distal SVG->rPDA anastomosis using a Resolute Onxy 4.0 x 12 mm drug-eluting stent with 0% residual stenosis and TIMI-3 flow.  Recommendations: 1. Admit for overnight observation following PCI with severe systolic dysfunction. 2. Dual antiplatelet therapy with aspirin and ticagrelor for at least 6-12 months, ideally indefinitely. If patient experiences respiratory side effects  from ticagrelor, he could be switched to clopidogrel. 3. Avoid further IV hydration today; restart diuresis tomorrow if renal function allows. 4. Aggressive medical therapy for severe ischemic cardiomyopathy. If renal function stable, can consider starting ACEI tomorrow. 5. Close outpatient follow-up with Dr. Wyline Mood.   Assessment and Plan   1. CAD/ICM/Chronic systolic HF - recent echo shows LVEF 15% a new finding for the patient - referred for cath, he received a stent to  his SVG-rPDA - continue DAPT at least 1 year  - we will increase Toprl XL to 100mg  daily, increase lisinpril to 5mg  daily. Check BMET in 2 weeks - nursing visit in 2 weeks for vitals check  2. NSVT - no symptoms -we will continue current meds  3. Carotid stenosis - continue to monitor   F/u 1 month     Antoine Poche, M.D., F.A.C.C.

## 2017-07-30 NOTE — Patient Instructions (Signed)
Medication Instructions:  Your physician has recommended you make the following change in your medication:  Begin taking Toprol- XL 100 mg daily  Begin taking Lisinopril 5 mg daily  Please continue all other medications as prescribed  Labwork: BMET - In 2 weeks. Orders given today  Testing/Procedures: none  Follow-Up: Your physician recommends that you schedule a follow-up appointment in  Nurse visit in 2 weeks  1 month follow up visit with Dr. Wyline Mood  Any Other Special Instructions Will Be Listed Below (If Applicable).  If you need a refill on your cardiac medications before your next appointment, please call your pharmacy.

## 2017-08-13 ENCOUNTER — Telehealth: Payer: Self-pay

## 2017-08-13 ENCOUNTER — Ambulatory Visit (INDEPENDENT_AMBULATORY_CARE_PROVIDER_SITE_OTHER): Payer: Medicare Other

## 2017-08-13 VITALS — BP 125/77 | HR 79

## 2017-08-13 DIAGNOSIS — I5022 Chronic systolic (congestive) heart failure: Secondary | ICD-10-CM | POA: Diagnosis not present

## 2017-08-13 MED ORDER — METOPROLOL SUCCINATE ER 100 MG PO TB24: ORAL_TABLET | ORAL | 3 refills | Status: DC

## 2017-08-13 NOTE — Telephone Encounter (Signed)
-----   Message from Antoine PocheJonathan F Branch, MD sent at 08/13/2017  1:30 PM EDT -----   ----- Message ----- From: Norva PavlovJoyce, Sharday Michl, LPN Sent: 1/6/10969/05/2017   9:41 AM To: Antoine PocheJonathan F Branch, MD

## 2017-08-13 NOTE — Telephone Encounter (Signed)
Patient notified of new medication change per Dr. Wyline MoodBranch. New prescription sent into pharmacy

## 2017-08-13 NOTE — Progress Notes (Signed)
Patients came into office today for vital check. BP 125/77 HR 79 O2 96%. Patient reports he has been taking all medication as prescribed, but has not quit smoking.

## 2017-08-13 NOTE — Progress Notes (Signed)
Please increase Toprol XL to 150mg  daily   Dominga FerryJ Rubert Frediani MD

## 2017-08-18 ENCOUNTER — Ambulatory Visit: Payer: Medicare Other | Admitting: Cardiology

## 2017-08-28 ENCOUNTER — Ambulatory Visit (INDEPENDENT_AMBULATORY_CARE_PROVIDER_SITE_OTHER): Payer: Medicare Other | Admitting: Cardiology

## 2017-08-28 ENCOUNTER — Encounter: Payer: Self-pay | Admitting: Cardiology

## 2017-08-28 VITALS — BP 118/70 | HR 79 | Ht 71.0 in | Wt 182.0 lb

## 2017-08-28 DIAGNOSIS — I2510 Atherosclerotic heart disease of native coronary artery without angina pectoris: Secondary | ICD-10-CM | POA: Diagnosis not present

## 2017-08-28 DIAGNOSIS — I255 Ischemic cardiomyopathy: Secondary | ICD-10-CM | POA: Diagnosis not present

## 2017-08-28 DIAGNOSIS — I5022 Chronic systolic (congestive) heart failure: Secondary | ICD-10-CM | POA: Diagnosis not present

## 2017-08-28 MED ORDER — METOPROLOL SUCCINATE ER 200 MG PO TB24: ORAL_TABLET | ORAL | 3 refills | Status: DC

## 2017-08-28 NOTE — Progress Notes (Signed)
Clinical Summary Dave [name] is a 61 y.o.male seen today for a focused visit on his history of chronic systolic HF, for more detailed history please refer to prior clinic notes.   1. CAD/Chronic systolic HF - 06/2009 CABG x 3 (LIMA-LAD, SVG-diag, SVG-PDA). He presented with an anterior MI.  - from prior clinic notes, LVEF 40% (do not see echo report) at that time.  -11/2016 echo that showed LVEF 15%, diffuse hypokinesis.  - Jan 2018 cath as reported below, received DES to distal SVG-rPDA. RHC with CI 2, mean PA 39, PCWP 29, LVEDP 22 - Discharge weight 191 lbs. Today weight is 181 lbs.  - not interested in cardiac rehab.     - since last visit we increased Toprol XL to  daily, then to 150 mg daily. Lisionpril was increased to  daily. Tolerating med changes without side effects - no recent SOB/DOE/LE edema.     Past Medical History:  Diagnosis Date  . Anterior myocardial infarction (HCC) 06/2009   [name] 12/16/2016  . Anxiety   . Anxiety disorder   . Chest pain   . Chronic brain syndrome   . Elevated blood pressure   . Tobacco abuse      No Known Allergies   Current Outpatient Prescriptions  Medication Sig Dispense Refill  . albuterol (PROVENTIL) (2.5 MG/3ML) 0.083% nebulizer solution Inhale 2.5 mg into the lungs every 4 (four) hours as needed for wheezing or shortness of breath.   99  . aspirin EC 81 MG tablet Take 81 mg by mouth daily.    Marland Kitchen atorvastatin (LIPITOR) 80 MG tablet Take 1 tablet (80 mg total) by mouth daily. 90 tablet 3  . furosemide (LASIX) 20 MG tablet TAKE 1 TAB DAILY AS NEEDED FOR SWELLING 90 tablet 3  . lisinopril (PRINIVIL,ZESTRIL) 5 MG tablet Take 1 tablet (5 mg total) by mouth daily. 30 tablet 1  . metoprolol succinate (TOPROL-XL) 100 MG 24 hr tablet Take 1 1/2 tablets (150 mg total) by mouth daily 90 tablet 3  . nitroGLYCERIN (NITROSTAT) 0.4 MG SL tablet Place 1 tablet (0.4 mg total) under the tongue every 5 (five) minutes as needed  for chest pain. 25 tablet 2  . PROAIR HFA 108 (90 Base) MCG/ACT inhaler Inhale 2 puffs into the lungs 4 (four) times daily as needed for shortness of breath.     . ticagrelor (BRILINTA) 90 MG TABS tablet Take 1 tablet (90 mg total) by mouth 2 (two) times daily. 60 tablet 10   No current facility-administered medications for this visit.      Past Surgical History:  Procedure Laterality Date  . CARDIAC CATHETERIZATION  06/2009   [name] 12/16/2016  . CARDIAC CATHETERIZATION N/A 12/18/2016   Procedure: Right/Left Heart Cath and Coronary/Graft Angiography;  Surgeon: Yvonne Kendall, MD;  Location: [facility] INVASIVE CV LAB;  Service: Cardiovascular;  Laterality: N/A;  . CARDIAC CATHETERIZATION N/A 12/18/2016   Procedure: Coronary Stent Intervention;  Surgeon: Yvonne Kendall, MD;  Location: [facility] INVASIVE CV LAB;  Service: Cardiovascular;  Laterality: N/A;  SVG-PDA  . CORONARY ANGIOPLASTY    . CORONARY ARTERY BYPASS GRAFT  2010   X3  . LAPAROSCOPIC CHOLECYSTECTOMY  2007     No Known Allergies    Family History  Problem Relation Age of Onset  . Coronary artery disease Unknown      Social History Dave [name] reports that he has been smoking.  He has a 44.00 pack-year smoking history. He has never used smokeless  tobacco. Dave [name] reports that he does not drink alcohol.   Review of Systems CONSTITUTIONAL: No weight loss, fever, chills, weakness or fatigue.  HEENT: Eyes: No visual loss, blurred vision, double vision or yellow sclerae.No hearing loss, sneezing, congestion, runny nose or sore throat.  SKIN: No rash or itching.  CARDIOVASCULAR: per hpi RESPIRATORY: No shortness of breath, cough or sputum.  GASTROINTESTINAL: No anorexia, nausea, vomiting or diarrhea. No abdominal pain or blood.  GENITOURINARY: No burning on urination, no polyuria NEUROLOGICAL: No headache, dizziness, syncope, paralysis, ataxia, numbness or tingling in the extremities. No change in bowel or bladder control.    MUSCULOSKELETAL: No muscle, back pain, joint pain or stiffness.  LYMPHATICS: No enlarged nodes. No history of splenectomy.  PSYCHIATRIC: No history of depression or anxiety.  ENDOCRINOLOGIC: No reports of sweating, cold or heat intolerance. No polyuria or polydipsia.  Marland Kitchen   Physical Examination Vitals:   08/28/17 1550  BP: 118/70  Pulse: 79  SpO2: 96%   Vitals:   08/28/17 1550  Weight: 182 lb (82.6 kg)  Height:  (1.803 m)    Gen: resting comfortably, no acute distress HEENT: no scleral icterus, pupils equal round and reactive, no palptable cervical adenopathy,  CV: RRR, no m/r/g, no jvd Resp: Clear to auscultation bilaterally GI: abdomen is soft, non-tender, non-distended, normal bowel sounds, no hepatosplenomegaly MSK: extremities are warm, no edema.  Skin: warm, no rash Neuro:  no focal deficits Psych: appropriate affect   Diagnostic Studies 06/2009 cath  IMPRESSION: 1. Severe global left ventricular dysfunction with an ejection fraction of approximately 25% with significant hypocontractility to akinesis involving an inferior wall hypocontractility anterolaterally and significant hypocontractility in the inferolateral wall. 2. Significant multivessel coronary obstructive disease with separate ostium giving rise to the left anterior descending and left circumflex vessels with diffuse 70% ostial proximal left anterior descending stenosis, 60% left anterior descending stenosis after the takeoff of the first septal and diagonal vessel and bifurcation 95% and 80% septal mid left anterior descending stenosis. 3. Large circumflex system with questionable ostial catheter spasm. 4. Total occlusion of the right coronary artery with extensive left-to- right collaterals. 5. An 80% left vertebral artery narrowing arising from the left circumflex system with evidence for plaque in the proximal subclavian  system with a widely patent unbypassed left internal mammary artery. 6. 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  RECOMMENDATIONS: The patient will be evaluated for consideration of CABG revascularization surgery.   Jan 2018 cath Conclusions: 1. Significant 2-vessel native coronary artery disease, including moderate diffuse proximal LAD disease and chronic total occlusion of mid LAD and ostial RCA. 70% proximal D1 stenosis is also present. 2. Moderate, non-obstructive disease involving LCx. Small branch of OM2 is chronically occluded. 3. Widely patent LIMA->LAD. 4. Patent SVG->rPDA with mild diffuse disease and discrete 95% stenosis at the distal anastomosis. 5. Chronically occluded SVG->D1. 6. Mildly elevated left and right heart filling pressures, as well as moderate pulmonary hypertension. 7. Normal to mildly reduced cardiac output. 8. Successful PCI to distal SVG->rPDA anastomosis using a Resolute Onxy 4.0 x 12 mm drug-eluting stent with 0% residual stenosis and TIMI-3 flow.  Recommendations: 1. Admit for overnight observation following PCI with severe systolic dysfunction. 2. Dual antiplatelet therapy with aspirin and ticagrelor for at least 6-12 months, ideally indefinitely. If patient experiences respiratory side effects from ticagrelor, he could be switched to clopidogrel. 3. Avoid further IV hydration today; restart diuresis tomorrow if renal function  allows. 4. Aggressive medical therapy for severe ischemic cardiomyopathy. If renal function stable, can consider starting ACEI tomorrow. 5. Close outpatient follow-up with Dr. Branch.    Assessment and Plan   1. CAD/ICM/Chronic systolic HF - recent echo shows LVEF 15% a new finding for the patient - referred for cath, he received a stent to his SVG-rPDA  - continue DAPT - we will increase Toprol XL to  daily. Nursing  visit 2 weeks for vitals check, pending check would titrate increase lisinopril to  daily. If tolerates ACE-I over time consider changing to entresto. F/u with me 1 month      Dave [name] M.D.

## 2017-08-28 NOTE — Patient Instructions (Signed)
Medication Instructions:  INCREASE METOPROLOL TO 200 MG DAILY   Labwork: NONE  Testing/Procedures: NONE  Follow-Up: Your physician recommends that you schedule a follow-up appointment in: 2 WEEKS NURSE VISIT  Your physician recommends that you schedule a follow-up appointment in: 1 MONTH WITH DR. BRANCH     Any Other Special Instructions Will Be Listed Below (If Applicable).     If you need a refill on your cardiac medications before your next appointment, please call your pharmacy.

## 2017-08-31 ENCOUNTER — Other Ambulatory Visit: Payer: Self-pay | Admitting: Cardiology

## 2017-09-11 ENCOUNTER — Ambulatory Visit: Payer: Medicare Other

## 2017-09-15 ENCOUNTER — Ambulatory Visit: Payer: Medicare Other

## 2017-09-16 ENCOUNTER — Ambulatory Visit (INDEPENDENT_AMBULATORY_CARE_PROVIDER_SITE_OTHER): Payer: Medicare Other | Admitting: *Deleted

## 2017-09-16 DIAGNOSIS — Z23 Encounter for immunization: Secondary | ICD-10-CM

## 2017-09-16 NOTE — Progress Notes (Signed)
Follow up with Dr. Wyline Mood as scheduled.

## 2017-09-16 NOTE — Progress Notes (Signed)
Patient in office for vitals check per Dr. Wyline Mood.  Tolerating all meds without difficulty.  Will fwd message to Dr. Purvis Sheffield as Dr. Wyline Mood is out of the office.

## 2017-09-17 NOTE — Progress Notes (Signed)
Patient notified to continue all medications the same for now.  Keep already scheduled follow up for 10/02/2017 with Dr. Wyline Mood.  He verbalized understanding.

## 2017-09-20 ENCOUNTER — Encounter

## 2017-09-20 MED ORDER — CHANTIX 1 MG TABLET
1 mg | ORAL_TABLET | ORAL | 0 refills | Status: DC
Start: 2017-09-20 — End: 2018-07-02

## 2017-10-01 ENCOUNTER — Encounter: Payer: Self-pay | Admitting: *Deleted

## 2017-10-02 ENCOUNTER — Other Ambulatory Visit: Payer: Self-pay | Admitting: Cardiology

## 2017-10-02 ENCOUNTER — Ambulatory Visit: Payer: Medicare Other | Admitting: Cardiology

## 2017-10-02 NOTE — Progress Notes (Deleted)
Clinical Summary Dave [name] is a 61 y.o.male  1. CAD/Chronic systolic HF - 06/2009 CABG x 3 (LIMA-LAD, SVG-diag, SVG-PDA). He presented with an anterior MI.  - from prior clinic notes, LVEF 40% (do not see echo report) at that time.  -11/2016 echo that showed LVEF 15%, diffuse hypokinesis.  - Jan 2018 cath as reported below, received DES to distal SVG-rPDA. RHC with CI 2, mean PA 39, PCWP 29, LVEDP 22 - Discharge weight 191 lbs. Today weight is 181 lbs.  - not interested in cardiac rehab.     - since last visit we increased Toprol XL to 100mg  daily, then to 150 mg daily. Lisionpril was increased to 5mg  daily. Tolerating med changes without side effects - no recent SOB/DOE/LE edema.     2. COPD - diagnosis mentioned in chart, however he is unaware of PFTs in the past   3. NSVT - denies any recent palpitations since last visit   4. Carotid stenosis  - moderate by [place] Jan 2018, asymptomatic Past Medical History:  Diagnosis Date  . Anterior myocardial infarction (HCC) 06/2009   [name] 12/16/2016  . Anxiety   . Anxiety disorder   . Chest pain   . Chronic brain syndrome   . Elevated blood pressure   . Tobacco abuse      No Known Allergies   Current Outpatient Prescriptions  Medication Sig Dispense Refill  . albuterol (PROVENTIL) (2.5 MG/3ML) 0.083% nebulizer solution Inhale 2.5 mg into the lungs every 4 (four) hours as needed for wheezing or shortness of breath.   99  . aspirin EC 81 MG tablet Take 81 mg by mouth daily.    Marland Kitchen atorvastatin (LIPITOR) 80 MG tablet Take 1 tablet (80 mg total) by mouth daily. 90 tablet 3  . furosemide (LASIX) 20 MG tablet TAKE 1 TAB DAILY AS NEEDED FOR SWELLING 90 tablet 3  . lisinopril (PRINIVIL,ZESTRIL) 5 MG tablet TAKE ONE TABLET BY MOUTH DAILY 30 tablet 0  . metoprolol succinate (TOPROL-XL) 200 MG 24 hr tablet Take 1 tablet daily. 90 tablet 3  . nitroGLYCERIN (NITROSTAT) 0.4 MG SL tablet Place 1 tablet (0.4 mg total) under  the tongue every 5 (five) minutes as needed for chest pain. 25 tablet 2  . PROAIR HFA 108 (90 Base) MCG/ACT inhaler Inhale 2 puffs into the lungs 4 (four) times daily as needed for shortness of breath.     . ticagrelor (BRILINTA) 90 MG TABS tablet Take 1 tablet (90 mg total) by mouth 2 (two) times daily. 60 tablet 10   No current facility-administered medications for this visit.      Past Surgical History:  Procedure Laterality Date  . CARDIAC CATHETERIZATION  06/2009   [name] 12/16/2016  . CARDIAC CATHETERIZATION N/A 12/18/2016   Procedure: Right/Left Heart Cath and Coronary/Graft Angiography;  Surgeon: Yvonne Kendall, MD;  Location: [facility] INVASIVE CV LAB;  Service: Cardiovascular;  Laterality: N/A;  . CARDIAC CATHETERIZATION N/A 12/18/2016   Procedure: Coronary Stent Intervention;  Surgeon: Yvonne Kendall, MD;  Location: [facility] INVASIVE CV LAB;  Service: Cardiovascular;  Laterality: N/A;  SVG-PDA  . CORONARY ANGIOPLASTY    . CORONARY ARTERY BYPASS GRAFT  2010   X3  . LAPAROSCOPIC CHOLECYSTECTOMY  2007     No Known Allergies    Family History  Problem Relation Age of Onset  . Coronary artery disease Unknown      Social History Dave [name] reports that he has been smoking.  He has a 44.00  pack-year smoking history. He has never used smokeless tobacco. Dave [name] reports that he does not drink alcohol.   Review of Systems CONSTITUTIONAL: No weight loss, fever, chills, weakness or fatigue.  HEENT: Eyes: No visual loss, blurred vision, double vision or yellow sclerae.No hearing loss, sneezing, congestion, runny nose or sore throat.  SKIN: No rash or itching.  CARDIOVASCULAR:  RESPIRATORY: No shortness of breath, cough or sputum.  GASTROINTESTINAL: No anorexia, nausea, vomiting or diarrhea. No abdominal pain or blood.  GENITOURINARY: No burning on urination, no polyuria NEUROLOGICAL: No headache, dizziness, syncope, paralysis, ataxia, numbness or tingling in the extremities. No  change in bowel or bladder control.  MUSCULOSKELETAL: No muscle, back pain, joint pain or stiffness.  LYMPHATICS: No enlarged nodes. No history of splenectomy.  PSYCHIATRIC: No history of depression or anxiety.  ENDOCRINOLOGIC: No reports of sweating, cold or heat intolerance. No polyuria or polydipsia.  Marland Kitchen   Physical Examination There were no vitals filed for this visit. There were no vitals filed for this visit.  Gen: resting comfortably, no acute distress HEENT: no scleral icterus, pupils equal round and reactive, no palptable cervical adenopathy,  CV Resp: Clear to auscultation bilaterally GI: abdomen is soft, non-tender, non-distended, normal bowel sounds, no hepatosplenomegaly MSK: extremities are warm, no edema.  Skin: warm, no rash Neuro:  no focal deficits Psych: appropriate affect   Diagnostic Studies 06/2009 cath  IMPRESSION: 1. Severe global left ventricular dysfunction with an ejection fraction of approximately 25% with significant hypocontractility to akinesis involving an inferior wall hypocontractility anterolaterally and significant hypocontractility in the inferolateral wall. 2. Significant multivessel coronary obstructive disease with separate ostium giving rise to the left anterior descending and left circumflex vessels with diffuse 70% ostial proximal left anterior descending stenosis, 60% left anterior descending stenosis after the takeoff of the first septal and diagonal vessel and bifurcation 95% and 80% septal mid left anterior descending stenosis. 3. Large circumflex system with questionable ostial catheter spasm. 4. Total occlusion of the right coronary artery with extensive left-to- right collaterals. 5. An 80% left vertebral artery narrowing arising from the left circumflex system with evidence for plaque in the proximal subclavian system with a widely patent unbypassed left  internal mammary artery. 6. 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  RECOMMENDATIONS: The patient will be evaluated for consideration of CABG revascularization surgery.   Jan 2018 cath Conclusions: 1. Significant 2-vessel native coronary artery disease, including moderate diffuse proximal LAD disease and chronic total occlusion of mid LAD and ostial RCA. 70% proximal D1 stenosis is also present. 2. Moderate, non-obstructive disease involving LCx. Small Gavyn Zoss of OM2 is chronically occluded. 3. Widely patent LIMA->LAD. 4. Patent SVG->rPDA with mild diffuse disease and discrete 95% stenosis at the distal anastomosis. 5. Chronically occluded SVG->D1. 6. Mildly elevated left and right heart filling pressures, as well as moderate pulmonary hypertension. 7. Normal to mildly reduced cardiac output. 8. Successful PCI to distal SVG->rPDA anastomosis using a Resolute Onxy 4.0 x 12 mm drug-eluting stent with 0% residual stenosis and TIMI-3 flow.  Recommendations: 1. Admit for overnight observation following PCI with severe systolic dysfunction. 2. Dual antiplatelet therapy with aspirin and ticagrelor for at least 6-12 months, ideally indefinitely. If patient experiences respiratory side effects from ticagrelor, he could be switched to clopidogrel. 3. Avoid further IV hydration today; restart diuresis tomorrow if renal function allows. 4. Aggressive medical therapy for severe ischemic cardiomyopathy. If renal function stable, can consider starting ACEI tomorrow.  5. Close outpatient follow-up with Dr. Wyline MoodBranch.    Assessment and Plan   1. CAD/ICM/Chronic systolic HF - recent echo shows LVEF 15% a new finding for the patient - referred for cath, he received a stent to his SVG-rPDA  - continue DAPT - we will increase Toprol XL to 200mg  daily. Nursing visit 2 weeks for vitals check, pending  check would titrate increase lisinopril to 10mg  daily. If tolerates ACE-I over time consider changing to entresto. F/u with me 1 month   2. NSVT - no symptoms - continue beta blocker  3. Carotid stenosis - we will repeat carotid US next year   F/u 1 month   Antoine PocheJonathan F. Elwyn Klosinski, M.D., F.A.C.C.

## 2017-10-20 ENCOUNTER — Ambulatory Visit (INDEPENDENT_AMBULATORY_CARE_PROVIDER_SITE_OTHER): Payer: Medicare Other | Admitting: Cardiology

## 2017-10-20 ENCOUNTER — Encounter: Payer: Self-pay | Admitting: Cardiology

## 2017-10-20 VITALS — BP 115/68 | HR 49 | Ht 71.0 in | Wt 183.2 lb

## 2017-10-20 DIAGNOSIS — I255 Ischemic cardiomyopathy: Secondary | ICD-10-CM | POA: Diagnosis not present

## 2017-10-20 DIAGNOSIS — I2510 Atherosclerotic heart disease of native coronary artery without angina pectoris: Secondary | ICD-10-CM | POA: Diagnosis not present

## 2017-10-20 DIAGNOSIS — I5022 Chronic systolic (congestive) heart failure: Secondary | ICD-10-CM

## 2017-10-20 NOTE — Progress Notes (Signed)
Clinical Summary Mr. Su HiltRoberts is a 61 y.o.male seen today for follow up of the following medical problems. This is a focused visit on his history of chronic systolic HF and CAD.   1. CAD/Chronic systolic HF - 06/2009 CABG x 3 (LIMA-LAD, SVG-diag, SVG-PDA). He presented with an anterior MI.  - from prior clinic notes, LVEF 40% (do not see echo report) at that time.  -11/2016 echo that showed LVEF 15%, diffuse hypokinesis.  - Jan 2018 cath as reported below, received DES to distal SVG-rPDA. RHC with CI 2, mean PA 39, PCWP 29, LVEDP 22 - Discharge weight 191 lbs. Today weight is 181 lbs.  - not interested in cardiac rehab.     - last visit we increased Toprol XL to 200mg  daily. Tolerating well without side effecrs - no recent SOB/DOE/LE edema     Past Medical History:  Diagnosis Date  . Anterior myocardial infarction (HCC) 06/2009   [name]/notes 12/16/2016  . Anxiety   . Anxiety disorder   . Chest pain   . Chronic brain syndrome   . Elevated blood pressure   . Tobacco abuse      No Known Allergies   Current Outpatient Medications  Medication Sig Dispense Refill  . albuterol (PROVENTIL) (2.5 MG/3ML) 0.083% nebulizer solution Inhale 2.5 mg into the lungs every 4 (four) hours as needed for wheezing or shortness of breath.   99  . aspirin EC 81 MG tablet Take 81 mg by mouth daily.    Marland Kitchen. atorvastatin (LIPITOR) 80 MG tablet TAKE ONE TABLET BY MOUTH DAILY 90 tablet 3  . furosemide (LASIX) 20 MG tablet TAKE 1 TAB DAILY AS NEEDED FOR SWELLING 90 tablet 3  . lisinopril (PRINIVIL,ZESTRIL) 5 MG tablet TAKE ONE TABLET BY MOUTH DAILY 30 tablet 0  . metoprolol succinate (TOPROL-XL) 200 MG 24 hr tablet Take 1 tablet daily. 90 tablet 3  . nitroGLYCERIN (NITROSTAT) 0.4 MG SL tablet Place 1 tablet (0.4 mg total) under the tongue every 5 (five) minutes as needed for chest pain. 25 tablet 2  . PROAIR HFA 108 (90 Base) MCG/ACT inhaler Inhale 2 puffs into the lungs 4 (four) times daily as  needed for shortness of breath.     . ticagrelor (BRILINTA) 90 MG TABS tablet Take 1 tablet (90 mg total) by mouth 2 (two) times daily. 60 tablet 10   No current facility-administered medications for this visit.      Past Surgical History:  Procedure Laterality Date  . CARDIAC CATHETERIZATION  06/2009   [name]/notes 12/16/2016  . CORONARY ANGIOPLASTY    . CORONARY ARTERY BYPASS GRAFT  2010   X3  . LAPAROSCOPIC CHOLECYSTECTOMY  2007     No Known Allergies    Family History  Problem Relation Age of Onset  . Coronary artery disease Unknown      Social History Mr. Su HiltRoberts reports that he has been smoking.  He has a 44.00 pack-year smoking history. he has never used smokeless tobacco. Mr. Su HiltRoberts reports that he does not drink alcohol.   Review of Systems CONSTITUTIONAL: No weight loss, fever, chills, weakness or fatigue.  HEENT: Eyes: No visual loss, blurred vision, double vision or yellow sclerae.No hearing loss, sneezing, congestion, runny nose or sore throat.  SKIN: No rash or itching.  CARDIOVASCULAR: per hpi RESPIRATORY: per hpi  GASTROINTESTINAL: No anorexia, nausea, vomiting or diarrhea. No abdominal pain or blood.  GENITOURINARY: No burning on urination, no polyuria NEUROLOGICAL: No headache, dizziness, syncope, paralysis, ataxia,  numbness or tingling in the extremities. No change in bowel or bladder control.  MUSCULOSKELETAL: No muscle, back pain, joint pain or stiffness.  LYMPHATICS: No enlarged nodes. No history of splenectomy.  PSYCHIATRIC: No history of depression or anxiety.  ENDOCRINOLOGIC: No reports of sweating, cold or heat intolerance. No polyuria or polydipsia.  Marland Kitchen.   Physical Examination Vitals:   10/20/17 1546  BP: 115/68  Pulse: (!) 49  SpO2: 97%   Vitals:   10/20/17 1546  Weight: 183 lb 3.2 oz (83.1 kg)  Height: 5\' 11"  (1.803 m)    Gen: resting comfortably, no acute distress HEENT: no scleral icterus, pupils equal round and reactive, no  palptable cervical adenopathy,  CV: regular, brady 55, no m/r/g, no jvd Resp: Clear to auscultation bilaterally GI: abdomen is soft, non-tender, non-distended, normal bowel sounds, no hepatosplenomegaly MSK: extremities are warm, no edema.  Skin: warm, no rash Neuro:  no focal deficits Psych: appropriate affect   Diagnostic Studies 06/2009 cath  IMPRESSION: 1. Severe global left ventricular dysfunction with an ejection fraction of approximately 25% with significant hypocontractility to akinesis involving an inferior wall hypocontractility anterolaterally and significant hypocontractility in the inferolateral wall. 2. Significant multivessel coronary obstructive disease with separate ostium giving rise to the left anterior descending and left circumflex vessels with diffuse 70% ostial proximal left anterior descending stenosis, 60% left anterior descending stenosis after the takeoff of the first septal and diagonal vessel and bifurcation 95% and 80% septal mid left anterior descending stenosis. 3. Large circumflex system with questionable ostial catheter spasm. 4. Total occlusion of the right coronary artery with extensive left-to- right collaterals. 5. An 80% left vertebral artery narrowing arising from the left circumflex system with evidence for plaque in the proximal subclavian system with a widely patent unbypassed left internal mammary artery. 6. 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  RECOMMENDATIONS: The patient will be evaluated for consideration of CABG revascularization surgery.   Jan 2018 cath Conclusions: 1. Significant 2-vessel native coronary artery disease, including moderate diffuse proximal LAD disease and chronic total occlusion of mid LAD and ostial RCA. 70% proximal D1 stenosis is also present. 2.  Moderate, non-obstructive disease involving LCx. Small Celise Bazar of OM2 is chronically occluded. 3. Widely patent LIMA->LAD. 4. Patent SVG->rPDA with mild diffuse disease and discrete 95% stenosis at the distal anastomosis. 5. Chronically occluded SVG->D1. 6. Mildly elevated left and right heart filling pressures, as well as moderate pulmonary hypertension. 7. Normal to mildly reduced cardiac output. 8. Successful PCI to distal SVG->rPDA anastomosis using a Resolute Onxy 4.0 x 12 mm drug-eluting stent with 0% residual stenosis and TIMI-3 flow.  Recommendations: 1. Admit for overnight observation following PCI with severe systolic dysfunction. 2. Dual antiplatelet therapy with aspirin and ticagrelor for at least 6-12 months, ideally indefinitely. If patient experiences respiratory side effects from ticagrelor, he could be switched to clopidogrel. 3. Avoid further IV hydration today; restart diuresis tomorrow if renal function allows. 4. Aggressive medical therapy for severe ischemic cardiomyopathy. If renal function stable, can consider starting ACEI tomorrow. 5. Close outpatient follow-up with Dr. Wyline MoodBranch.     Assessment and Plan  1. CAD/ICM/Chronic systolic HF - no recent symptoms. EKG today shows SR without acute ischemic changes - repeat echo, pending results consider ICD referral. Consider changing to entresto      Antoine PocheJonathan F. Dujuan Stankowski, M.D.

## 2017-10-20 NOTE — Patient Instructions (Signed)
Your physician recommends that you schedule a follow-up appointment in: TO BE DETERMINED WITH DR BRANCH  Your physician recommends that you continue on your current medications as directed. Please refer to the Current Medication list given to you today.  Your physician has requested that you have an echocardiogram. Echocardiography is a painless test that uses sound waves to create images of your heart. It provides your doctor with information about the size and shape of your heart and how well your heart's chambers and valves are working. This procedure takes approximately one hour. There are no restrictions for this procedure.  Thank you for choosing [facility]!!    

## 2017-10-24 ENCOUNTER — Encounter: Payer: Self-pay | Admitting: Cardiology

## 2017-10-26 ENCOUNTER — Encounter

## 2017-10-28 ENCOUNTER — Ambulatory Visit (INDEPENDENT_AMBULATORY_CARE_PROVIDER_SITE_OTHER): Payer: Medicare Other

## 2017-10-28 ENCOUNTER — Other Ambulatory Visit: Payer: Self-pay

## 2017-10-28 DIAGNOSIS — I5022 Chronic systolic (congestive) heart failure: Secondary | ICD-10-CM | POA: Diagnosis not present

## 2017-10-28 MED ORDER — METFORMIN SR 500 MG 24 HR TABLET
500 mg | ORAL_TABLET | ORAL | 4 refills | Status: DC
Start: 2017-10-28 — End: 2018-03-08

## 2017-10-31 ENCOUNTER — Encounter

## 2017-11-02 MED ORDER — CYCLOBENZAPRINE 10 MG TAB
10 mg | ORAL_TABLET | ORAL | 1 refills | Status: DC
Start: 2017-11-02 — End: 2018-06-25

## 2017-11-03 ENCOUNTER — Other Ambulatory Visit: Payer: Self-pay | Admitting: Cardiology

## 2017-11-04 ENCOUNTER — Telehealth: Payer: Self-pay | Admitting: *Deleted

## 2017-11-04 NOTE — Telephone Encounter (Signed)
Pt aware and voiced understanding scheduled 12/6 @940am  - routed to pcp

## 2017-11-04 NOTE — Telephone Encounter (Signed)
-----   Message from Antoine PocheJonathan F Branch, MD sent at 11/02/2017  2:33 PM EST ----- Heart function remains weak at 25%, we will need to continue working with medications and will also need to discuss a possible defibrillator further. Should see me back in 3 weeks  Dominga FerryJ Branch MD

## 2017-11-06 NOTE — Addendum Note (Signed)
Addended by: Burton ApleyPETTY, Makyla Bye A on: 11/06/2017 01:47 PM   Modules accepted: Orders

## 2017-11-09 DIAGNOSIS — Z7902 Long term (current) use of antithrombotics/antiplatelets: Secondary | ICD-10-CM | POA: Diagnosis not present

## 2017-11-09 DIAGNOSIS — Z951 Presence of aortocoronary bypass graft: Secondary | ICD-10-CM | POA: Diagnosis not present

## 2017-11-09 DIAGNOSIS — Z7189 Other specified counseling: Secondary | ICD-10-CM | POA: Diagnosis not present

## 2017-11-09 DIAGNOSIS — R0602 Shortness of breath: Secondary | ICD-10-CM | POA: Diagnosis not present

## 2017-11-09 DIAGNOSIS — F17210 Nicotine dependence, cigarettes, uncomplicated: Secondary | ICD-10-CM | POA: Diagnosis not present

## 2017-11-09 DIAGNOSIS — Z6825 Body mass index (BMI) 25.0-25.9, adult: Secondary | ICD-10-CM | POA: Diagnosis not present

## 2017-11-09 DIAGNOSIS — I429 Cardiomyopathy, unspecified: Secondary | ICD-10-CM | POA: Diagnosis not present

## 2017-11-09 DIAGNOSIS — Z125 Encounter for screening for malignant neoplasm of prostate: Secondary | ICD-10-CM | POA: Diagnosis not present

## 2017-11-09 DIAGNOSIS — Z1211 Encounter for screening for malignant neoplasm of colon: Secondary | ICD-10-CM | POA: Diagnosis not present

## 2017-11-09 DIAGNOSIS — J441 Chronic obstructive pulmonary disease with (acute) exacerbation: Secondary | ICD-10-CM | POA: Diagnosis not present

## 2017-11-09 DIAGNOSIS — Z720 Tobacco use: Secondary | ICD-10-CM | POA: Diagnosis not present

## 2017-11-09 DIAGNOSIS — Z1331 Encounter for screening for depression: Secondary | ICD-10-CM | POA: Diagnosis not present

## 2017-11-09 DIAGNOSIS — Z299 Encounter for prophylactic measures, unspecified: Secondary | ICD-10-CM | POA: Diagnosis not present

## 2017-11-09 DIAGNOSIS — I252 Old myocardial infarction: Secondary | ICD-10-CM | POA: Diagnosis not present

## 2017-11-09 DIAGNOSIS — Z7982 Long term (current) use of aspirin: Secondary | ICD-10-CM | POA: Diagnosis not present

## 2017-11-09 DIAGNOSIS — Z79899 Other long term (current) drug therapy: Secondary | ICD-10-CM | POA: Diagnosis not present

## 2017-11-09 DIAGNOSIS — Z0000 Encounter for general adult medical examination without abnormal findings: Secondary | ICD-10-CM | POA: Diagnosis not present

## 2017-11-09 DIAGNOSIS — R5383 Other fatigue: Secondary | ICD-10-CM | POA: Diagnosis not present

## 2017-11-09 DIAGNOSIS — Z1339 Encounter for screening examination for other mental health and behavioral disorders: Secondary | ICD-10-CM | POA: Diagnosis not present

## 2017-11-09 DIAGNOSIS — E7800 Pure hypercholesterolemia, unspecified: Secondary | ICD-10-CM | POA: Diagnosis not present

## 2017-11-09 DIAGNOSIS — J449 Chronic obstructive pulmonary disease, unspecified: Secondary | ICD-10-CM | POA: Diagnosis not present

## 2017-11-10 DIAGNOSIS — R0602 Shortness of breath: Secondary | ICD-10-CM | POA: Diagnosis not present

## 2017-11-11 ENCOUNTER — Encounter: Payer: Self-pay | Admitting: *Deleted

## 2017-11-12 ENCOUNTER — Encounter: Payer: Self-pay | Admitting: *Deleted

## 2017-11-12 ENCOUNTER — Encounter: Payer: Self-pay | Admitting: Cardiology

## 2017-11-12 ENCOUNTER — Ambulatory Visit (INDEPENDENT_AMBULATORY_CARE_PROVIDER_SITE_OTHER): Payer: Medicare Other | Admitting: Cardiology

## 2017-11-12 ENCOUNTER — Telehealth: Payer: Self-pay | Admitting: Cardiology

## 2017-11-12 VITALS — BP 116/72 | HR 66 | Ht 71.0 in | Wt 184.2 lb

## 2017-11-12 DIAGNOSIS — I2510 Atherosclerotic heart disease of native coronary artery without angina pectoris: Secondary | ICD-10-CM

## 2017-11-12 DIAGNOSIS — I5022 Chronic systolic (congestive) heart failure: Secondary | ICD-10-CM

## 2017-11-12 DIAGNOSIS — I255 Ischemic cardiomyopathy: Secondary | ICD-10-CM | POA: Diagnosis not present

## 2017-11-12 MED ORDER — SACUBITRIL-VALSARTAN 24-26 MG PO TABS: 1.0000 | ORAL_TABLET | Freq: Two times a day (BID) | ORAL | 1 refills | Status: DC

## 2017-11-12 MED ORDER — SACUBITRIL-VALSARTAN 24-26 MG PO TABS: 1.0000 | ORAL_TABLET | Freq: Two times a day (BID) | ORAL | 0 refills | Status: DC

## 2017-11-12 NOTE — Telephone Encounter (Signed)
LM to return call.

## 2017-11-12 NOTE — Telephone Encounter (Signed)
[name] called wanting to speak to someone about his medications.

## 2017-11-12 NOTE — Patient Instructions (Signed)
Your physician recommends that you schedule a follow-up appointment in: [facility]  Your physician has recommended you make the following change in your medication:   STOP LISINOPRIL   2 DAYS AFTER STOPPING LISINOPRIL - START ENTRESTO 24/26 MG TWICE DAILY   Thank you for choosing [facility]!!

## 2017-11-12 NOTE — Progress Notes (Signed)
Clinical Summary Dave [name] is a 61 y.o.male seen today for follow up of the following medical problems.  1. CAD/Chronic systolic HF - 06/2009 CABG x 3 (LIMA-LAD, SVG-diag, SVG-PDA). He presented with an anterior MI.  - from prior clinic notes, LVEF 40% (do not see echo report) at that time.  -11/2016 echo that showed LVEF 15%, diffuse hypokinesis.  - Jan 2018 cath as reported below, received DES to distal SVG-rPDA. RHC with CI 2, mean PA 39, PCWP 29, LVEDP 22 - Discharge weight 191 lbs. Today weight is 181 lbs.  - not interested in cardiac rehab.    - repeat echo 10/2017 LVEF 25%. Since last visit no new symptoms.  - tolreating meds without side effects   Past Medical History:  Diagnosis Date  . Anterior myocardial infarction (HCC) 06/2009   Dave [name]/notes 12/16/2016  . Anxiety   . Anxiety disorder   . Chest pain   . Chronic brain syndrome   . Elevated blood pressure   . Tobacco abuse      No Known Allergies   Current Outpatient Medications  Medication Sig Dispense Refill  . albuterol (PROVENTIL) (2.5 MG/3ML) 0.083% nebulizer solution Inhale 2.5 mg into the lungs every 4 (four) hours as needed for wheezing or shortness of breath.   99  . aspirin EC 81 MG tablet Take 81 mg by mouth daily.    Marland Kitchen. atorvastatin (LIPITOR) 80 MG tablet TAKE ONE TABLET BY MOUTH DAILY 90 tablet 3  . furosemide (LASIX) 20 MG tablet TAKE 1 TAB DAILY AS NEEDED FOR SWELLING 90 tablet 3  . lisinopril (PRINIVIL,ZESTRIL) 5 MG tablet TAKE ONE TABLET BY MOUTH DAILY 30 tablet 0  . metoprolol succinate (TOPROL-XL) 200 MG 24 hr tablet Take 1 tablet daily. 90 tablet 3  . nitroGLYCERIN (NITROSTAT) 0.4 MG SL tablet Place 1 tablet (0.4 mg total) under the tongue every 5 (five) minutes as needed for chest pain. 25 tablet 2  . PROAIR HFA 108 (90 Base) MCG/ACT inhaler Inhale 2 puffs into the lungs 4 (four) times daily as needed for shortness of breath.     . ticagrelor (BRILINTA) 90 MG TABS tablet Take 1 tablet (90  mg total) by mouth 2 (two) times daily. 60 tablet 10   No current facility-administered medications for this visit.      Past Surgical History:  Procedure Laterality Date  . CARDIAC CATHETERIZATION  06/2009   Dave [name]/notes 12/16/2016  . CARDIAC CATHETERIZATION N/A 12/18/2016   Procedure: Right/Left Heart Cath and Coronary/Graft Angiography;  Surgeon: Yvonne Kendallhristopher End, MD;  [facility];  Service: Cardiovascular;  Laterality: N/A;  . CARDIAC CATHETERIZATION N/A 12/18/2016   Procedure: Coronary Stent Intervention;  Surgeon: Yvonne Kendallhristopher End, MD;  Location: [facility] INVASIVE CV LAB;  Service: Cardiovascular;  Laterality: N/A;  SVG-PDA  . CORONARY ANGIOPLASTY    . CORONARY ARTERY BYPASS GRAFT  2010   X3  . LAPAROSCOPIC CHOLECYSTECTOMY  2007     No Known Allergies    Family History  Problem Relation Age of Onset  . Coronary artery disease Unknown      Social History Dave [name] reports that he has been smoking.  He has a 44.00 pack-year smoking history. he has never used smokeless tobacco. Dave [name] reports that he does not drink alcohol.   Review of Systems CONSTITUTIONAL: No weight loss, fever, chills, weakness or fatigue.  HEENT: Eyes: No visual loss, blurred vision, double vision or yellow sclerae.No hearing loss, sneezing, congestion, runny nose  or sore throat.  SKIN: No rash or itching.  CARDIOVASCULAR: no chest pain, no palpitations.  RESPIRATORY: No shortness of breath, cough or sputum.  GASTROINTESTINAL: No anorexia, nausea, vomiting or diarrhea. No abdominal pain or blood.  GENITOURINARY: No burning on urination, no polyuria NEUROLOGICAL: No headache, dizziness, syncope, paralysis, ataxia, numbness or tingling in the extremities. No change in bowel or bladder control.  MUSCULOSKELETAL: No muscle, back pain, joint pain or stiffness.  LYMPHATICS: No enlarged nodes. No history of splenectomy.  PSYCHIATRIC: No history of depression or anxiety.  ENDOCRINOLOGIC: No  reports of sweating, cold or heat intolerance. No polyuria or polydipsia.  Marland Kitchen   Physical Examination Vitals:   11/12/17 0931  BP: 116/72  Pulse: 66  SpO2: 97%   Vitals:   11/12/17 0931  Weight: 184 lb 3.2 oz (83.6 kg)  Height: 5\' 11"  (1.803 m)    Gen: resting comfortably, no acute distress HEENT: no scleral icterus, pupils equal round and reactive, no palptable cervical adenopathy,  CV: RRR, no m/r/g, no jvd Resp: Clear to auscultation bilaterally GI: abdomen is soft, non-tender, non-distended, normal bowel sounds, no hepatosplenomegaly MSK: extremities are warm, no edema.  Skin: warm, no rash Neuro:  no focal deficits Psych: appropriate affect   Diagnostic Studies 06/2009 cath  IMPRESSION: 1. Severe global left ventricular dysfunction with an ejection fraction of approximately 25% with significant hypocontractility to akinesis involving an inferior wall hypocontractility anterolaterally and significant hypocontractility in the inferolateral wall. 2. Significant multivessel coronary obstructive disease with separate ostium giving rise to the left anterior descending and left circumflex vessels with diffuse 70% ostial proximal left anterior descending stenosis, 60% left anterior descending stenosis after the takeoff of the first septal and diagonal vessel and bifurcation 95% and 80% septal mid left anterior descending stenosis. 3. Large circumflex system with questionable ostial catheter spasm. 4. Total occlusion of the right coronary artery with extensive left-to- right collaterals. 5. An 80% left vertebral artery narrowing arising from the left circumflex system with evidence for plaque in the proximal subclavian system with a widely patent unbypassed left internal mammary artery. 6. 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  RECOMMENDATIONS: The patient will be evaluated for consideration of CABG revascularization surgery.   Jan 2018 cath Conclusions: 1. Significant 2-vessel native coronary artery disease, including moderate diffuse proximal LAD disease and chronic total occlusion of mid LAD and ostial RCA. 70% proximal D1 stenosis is also present. 2. Moderate, non-obstructive disease involving LCx. Small branch of OM2 is chronically occluded. 3. Widely patent LIMA->LAD. 4. Patent SVG->rPDA with mild diffuse disease and discrete 95% stenosis at the distal anastomosis. 5. Chronically occluded SVG->D1. 6. Mildly elevated left and right heart filling pressures, as well as moderate pulmonary hypertension. 7. Normal to mildly reduced cardiac output. 8. Successful PCI to distal SVG->rPDA anastomosis using a Resolute Onxy 4.0 x 12 mm drug-eluting stent with 0% residual stenosis and TIMI-3 flow.  Recommendations: 1. Admit for overnight observation following PCI with severe systolic dysfunction. 2. Dual antiplatelet therapy with aspirin and ticagrelor for at least 6-12 months, ideally indefinitely. If patient experiences respiratory side effects from ticagrelor, he could be switched to clopidogrel. 3. Avoid further IV hydration today; restart diuresis tomorrow if renal function allows. 4. Aggressive medical therapy for severe ischemic cardiomyopathy. If renal function stable, can consider starting ACEI tomorrow. 5. Close outpatient follow-up with Dr. Wyline Mood.   10/2017 echo Study Conclusions  - Left ventricle: The cavity size  was mildly dilated. Wall   thickness was increased in a pattern of mild LVH. Systolic   function was severely reduced. The estimated ejection fraction   was 25%. Diffuse hypokinesis. There is akinesis of the   basal-midinferior myocardium. Features are consistent with a   pseudonormal left ventricular filling pattern, with concomitant   abnormal  relaxation and increased filling pressure (grade 2   diastolic dysfunction). - Aortic valve: Mildly calcified annulus. Trileaflet. - Mitral valve: Mildly thickened leaflets . There was trivial   regurgitation. - Right atrium: Central venous pressure (est): 3 mm Hg. - Tricuspid valve: There was trivial regurgitation. - Pulmonary arteries: PA peak pressure: 25 mm Hg (S). - Pericardium, extracardiac: There was no pericardial effusion.  Impressions:  - Mild LVH with mild chamber dilatation and LVEF approximately 25%.   Diffuse hypokinesis with mid to basal inferior akinesis. Grade 2   diastolic dysfunction. Mildly thickened mitral leaflets with   trivial mitral regurgitation. Trivial tricuspid regurgitation   with PASP 25 mmHg.  Assessment and Plan  1. CAD/ICM/Chronic systolic HF - remains asymptomatic - LVEF remains decreased at 25%. We discussed ICD in detail, he is not intrested at this time - we will stop lisinopril, wait 48 hrs and start entresto 24/26mg  bid.    F/u 1 month      Antoine PocheJonathan F. Branch, M.D.

## 2017-11-17 ENCOUNTER — Encounter: Payer: Self-pay | Admitting: Cardiology

## 2017-11-17 NOTE — Telephone Encounter (Signed)
LM to return call after 10am 11/18/17

## 2017-11-18 ENCOUNTER — Ambulatory Visit (INDEPENDENT_AMBULATORY_CARE_PROVIDER_SITE_OTHER): Payer: Medicare Other | Admitting: Vascular Surgery

## 2017-11-18 ENCOUNTER — Ambulatory Visit (HOSPITAL_COMMUNITY)
Admission: RE | Admit: 2017-11-18 | Discharge: 2017-11-18 | Disposition: A | Discharge status: Home / Self Care | Payer: Medicare Other | Source: Ambulatory Visit | Attending: Vascular Surgery | Admitting: Vascular Surgery

## 2017-11-18 ENCOUNTER — Encounter: Payer: Self-pay | Admitting: Vascular Surgery

## 2017-11-18 VITALS — BP 114/69 | HR 71 | Temp 97.3°F | Resp 18 | Ht 71.0 in | Wt 189.9 lb

## 2017-11-18 DIAGNOSIS — I70219 Atherosclerosis of native arteries of extremities with intermittent claudication, unspecified extremity: Secondary | ICD-10-CM | POA: Diagnosis not present

## 2017-11-18 DIAGNOSIS — I255 Ischemic cardiomyopathy: Secondary | ICD-10-CM

## 2017-11-18 DIAGNOSIS — R9439 Abnormal result of other cardiovascular function study: Secondary | ICD-10-CM | POA: Diagnosis not present

## 2017-11-18 DIAGNOSIS — I739 Peripheral vascular disease, unspecified: Secondary | ICD-10-CM | POA: Insufficient documentation

## 2017-11-18 DIAGNOSIS — I70249 Atherosclerosis of native arteries of left leg with ulceration of unspecified site: Secondary | ICD-10-CM | POA: Diagnosis present

## 2017-11-18 NOTE — Progress Notes (Signed)
Patient name: Dave [name] MRN: [id number] DOB: 1956/10/30 Sex: male  REASON FOR VISIT:   Follow-up of peripheral vascular disease  HPI:   Dave [name] is a pleasant 61 y.o. male who I saw in consultation on 05/06/2017.  He had presented with a long history of bilateral lower extremity claudication.  ABI on the right at that time was 60%.  ABI on the left was 79%.  Discussed conservative options including a structured walking program.  Set him up for 30-month follow-up visit.  If his symptoms progressed to the feeling was that he could be considered for arteriography.  The patient continues to have some pain in both lower extremities which is worse when he is walking.  His pain goes away when he sits down.  The pain is mostly in his hips but also somewhat in his thighs and calves.  I think he has lumbar disc disease which explains a significant percentage of his symptoms although he also has some underlying peripheral vascular disease.  He does not describe any rest pain or history of nonhealing ulcers.   The patient also has a history of carotid disease.  He had a carotid duplex scan done by his cardiologist in December 2017 which showed no significant stenosis on the right but a 60-79% left carotid stenosis.  He is asymptomatic.  He denies any history of stroke, TIAs, expressive or receptive aphasia, or amaurosis fugax.  He states that he just had a follow-up carotid duplex scan but I do not have these results.  His carotid disease is followed by Dr. Wyline Mood.   He does continue to smoke about a pack per day of cigarettes.  He is on aspirin, a statin, and Brilinta.  Past Medical History:  Diagnosis Date  . Anterior myocardial infarction (HCC) 06/2009   [name] 12/16/2016  . Anxiety   . Anxiety disorder   . Chest pain   . Chronic brain syndrome   . Elevated blood pressure   . Tobacco abuse     Family History  Problem Relation Age of Onset  . Coronary artery disease Unknown      SOCIAL HISTORY: Social History   Tobacco Use  . Smoking status: Current Every Day Smoker    Packs/day: 1.00    Years: 44.00    Pack years: 44.00  . Smokeless tobacco: Never Used  Substance Use Topics  . Alcohol use: No    No Known Allergies  Current Outpatient Medications  Medication Sig Dispense Refill  . albuterol (PROVENTIL) (2.5 MG/3ML) 0.083% nebulizer solution Inhale 2.5 mg into the lungs every 4 (four) hours as needed for wheezing or shortness of breath.   99  . aspirin EC 81 MG tablet Take 81 mg by mouth daily.    Marland Kitchen atorvastatin (LIPITOR) 80 MG tablet TAKE ONE TABLET BY MOUTH DAILY 90 tablet 3  . furosemide (LASIX) 20 MG tablet TAKE 1 TAB DAILY AS NEEDED FOR SWELLING 90 tablet 3  . metoprolol succinate (TOPROL-XL) 200 MG 24 hr tablet Take 1 tablet daily. 90 tablet 3  . nitroGLYCERIN (NITROSTAT) 0.4 MG SL tablet Place 1 tablet (0.4 mg total) under the tongue every 5 (five) minutes as needed for chest pain. 25 tablet 2  . PROAIR HFA 108 (90 Base) MCG/ACT inhaler Inhale 2 puffs into the lungs 4 (four) times daily as needed for shortness of breath.     . sacubitril-valsartan (ENTRESTO) 24-26 MG Take 1 tablet by mouth 2 (two) times daily. 28 tablet  0  . ticagrelor (BRILINTA) 90 MG TABS tablet Take 1 tablet (90 mg total) by mouth 2 (two) times daily. 60 tablet 10   No current facility-administered medications for this visit.     REVIEW OF SYSTEMS:  [X]  denotes positive finding, [ ]  denotes negative finding Cardiac  Comments:  Chest pain or chest pressure: X   Shortness of breath upon exertion: X   Short of breath when lying flat: X   Irregular heart rhythm:        Vascular    Pain in calf, thigh, or hip brought on by ambulation: X   Pain in feet at night that wakes you up from your sleep:     Blood clot in your veins:    Leg swelling:         Pulmonary    Oxygen at home:    Productive cough:  X   Wheezing:  X       Neurologic    Sudden weakness in arms or  legs:     Sudden numbness in arms or legs:     Sudden onset of difficulty speaking or slurred speech:    Temporary loss of vision in one eye:     Problems with dizziness:         Gastrointestinal    Blood in stool:     Vomited blood:         Genitourinary    Burning when urinating:     Blood in urine:        Psychiatric    Major depression:         Hematologic    Bleeding problems:    Problems with blood clotting too easily:        Skin    Rashes or ulcers:        Constitutional    Fever or chills:     PHYSICAL EXAM:   Vitals:   11/18/17 1018  BP: 114/69  Pulse: 71  Resp: 18  Temp: (!) 97.3 F (36.3 C)  TempSrc: Oral  SpO2: 97%  Weight: 189 lb 14.4 oz (86.1 kg)  Height: 5\' 11"  (1.803 m)    GENERAL: The patient is a well-nourished male, in no acute distress. The vital signs are documented above. CARDIAC: There is a regular rate and rhythm.  VASCULAR: I do not detect carotid bruits today. He has palpable femoral pulses with a slightly diminished right femoral pulse. On the right side he has a weakly palpable posterior tibial pulse.  I cannot palpate a dorsalis pedis pulse. On the left side I cannot palpate pedal pulses. He has no significant lower extremity swelling. PULMONARY: There is good air exchange bilaterally without wheezing or rales. ABDOMEN: Soft and non-tender with normal pitched bowel sounds.  MUSCULOSKELETAL: There are no major deformities or cyanosis. NEUROLOGIC: No focal weakness or paresthesias are detected. SKIN: There are no ulcers or rashes noted. PSYCHIATRIC: The patient has a normal affect.  DATA:    ARTERIAL DOPPLER STUDY: I have independently interpreted his arterial Doppler study today.  On the right side there is a biphasic posterior tibial signal with a monophasic dorsalis pedis signal.  ABI on the right is 88% with a toe pressure 79 mmHg.  On the left side there is a monophasic posterior tibial signal with a triphasic dorsalis  pedis signal.  ABIs 86% with a toe pressure of 70.  MEDICAL ISSUES:   PERIPHERAL VASCULAR DISEASE: I think his peripheral vascular disease is stable.  His ABIs have actually improved.  I think a significant percentage of his pain is related to his back problems.  I have encouraged him to stay as active as possible.  We have again discussed the importance of tobacco cessation.  We would only consider arteriography if his symptoms progressed significantly or he developed rest pain or nonhealing ulcer.  I have ordered follow-up ABIs in 1 year and I will see him back at that time.  He knows to call sooner if he has problems.  HISTORY OF A 60-79% ASYMPTOMATIC LEFT CAROTID STENOSIS: He just recently had a follow-up carotid duplex scan.  I do not have these results.  His carotid disease is followed by Dr. Wyline MoodBranch. I have explained that if the stenosis on the left progresses to greater than 80%, he should be considered for left carotid endarterectomy in order to lower his risk of future stroke.  Waverly Ferrarihristopher Dickson [facility] [phone]

## 2017-11-19 NOTE — Addendum Note (Signed)
Addended by: Burton ApleyPETTY, Keaira Whitehurst A on: 11/19/2017 12:41 PM   Modules accepted: Orders

## 2017-11-24 NOTE — Telephone Encounter (Signed)
Left multiple messages with no return call

## 2017-12-22 ENCOUNTER — Ambulatory Visit: Payer: Medicare Other | Admitting: Cardiology

## 2018-01-02 DIAGNOSIS — Z955 Presence of coronary angioplasty implant and graft: Secondary | ICD-10-CM | POA: Diagnosis not present

## 2018-01-02 DIAGNOSIS — I10 Essential (primary) hypertension: Secondary | ICD-10-CM | POA: Diagnosis not present

## 2018-01-02 DIAGNOSIS — R1031 Right lower quadrant pain: Secondary | ICD-10-CM | POA: Diagnosis not present

## 2018-01-02 DIAGNOSIS — I252 Old myocardial infarction: Secondary | ICD-10-CM | POA: Diagnosis not present

## 2018-01-02 DIAGNOSIS — R1084 Generalized abdominal pain: Secondary | ICD-10-CM | POA: Diagnosis not present

## 2018-01-02 DIAGNOSIS — Z7982 Long term (current) use of aspirin: Secondary | ICD-10-CM | POA: Diagnosis not present

## 2018-01-02 DIAGNOSIS — N201 Calculus of ureter: Secondary | ICD-10-CM | POA: Diagnosis not present

## 2018-01-02 DIAGNOSIS — Z7902 Long term (current) use of antithrombotics/antiplatelets: Secondary | ICD-10-CM | POA: Diagnosis not present

## 2018-01-02 DIAGNOSIS — F17200 Nicotine dependence, unspecified, uncomplicated: Secondary | ICD-10-CM | POA: Diagnosis not present

## 2018-01-02 DIAGNOSIS — K4090 Unilateral inguinal hernia, without obstruction or gangrene, not specified as recurrent: Secondary | ICD-10-CM | POA: Diagnosis not present

## 2018-01-02 DIAGNOSIS — F419 Anxiety disorder, unspecified: Secondary | ICD-10-CM | POA: Diagnosis not present

## 2018-01-02 DIAGNOSIS — Z79899 Other long term (current) drug therapy: Secondary | ICD-10-CM | POA: Diagnosis not present

## 2018-01-02 DIAGNOSIS — R110 Nausea: Secondary | ICD-10-CM | POA: Diagnosis not present

## 2018-01-02 DIAGNOSIS — R1030 Lower abdominal pain, unspecified: Secondary | ICD-10-CM | POA: Diagnosis not present

## 2018-01-02 DIAGNOSIS — N50819 Testicular pain, unspecified: Secondary | ICD-10-CM | POA: Diagnosis not present

## 2018-01-02 DIAGNOSIS — Z951 Presence of aortocoronary bypass graft: Secondary | ICD-10-CM | POA: Diagnosis not present

## 2018-01-02 DIAGNOSIS — K2970 Gastritis, unspecified, without bleeding: Secondary | ICD-10-CM | POA: Diagnosis not present

## 2018-01-02 DIAGNOSIS — I2510 Atherosclerotic heart disease of native coronary artery without angina pectoris: Secondary | ICD-10-CM | POA: Diagnosis not present

## 2018-01-02 DIAGNOSIS — J449 Chronic obstructive pulmonary disease, unspecified: Secondary | ICD-10-CM | POA: Diagnosis not present

## 2018-01-13 ENCOUNTER — Encounter: Payer: Self-pay | Admitting: Cardiology

## 2018-01-13 ENCOUNTER — Other Ambulatory Visit: Payer: Self-pay

## 2018-01-13 ENCOUNTER — Ambulatory Visit (INDEPENDENT_AMBULATORY_CARE_PROVIDER_SITE_OTHER): Payer: Medicare Other | Admitting: Cardiology

## 2018-01-13 VITALS — BP 120/70 | HR 65 | Ht 70.0 in | Wt 190.0 lb

## 2018-01-13 DIAGNOSIS — I6523 Occlusion and stenosis of bilateral carotid arteries: Secondary | ICD-10-CM | POA: Diagnosis not present

## 2018-01-13 DIAGNOSIS — I2510 Atherosclerotic heart disease of native coronary artery without angina pectoris: Secondary | ICD-10-CM | POA: Diagnosis not present

## 2018-01-13 DIAGNOSIS — I5022 Chronic systolic (congestive) heart failure: Secondary | ICD-10-CM

## 2018-01-13 MED ORDER — SACUBITRIL-VALSARTAN 49-51 MG PO TABS: 1.0000 | ORAL_TABLET | Freq: Two times a day (BID) | ORAL | 3 refills | Status: DC

## 2018-01-13 NOTE — Patient Instructions (Signed)
Your physician recommends that you schedule a follow-up appointment in: 6 WEEKS [facility]  Your physician has recommended you make the following change in your medication:   INCREASE ENTRESTO 49/51 MG TWICE DAILY  Your physician has requested that you have a carotid duplex. This test is an ultrasound of the carotid arteries in your neck. It looks at blood flow through these arteries that supply the brain with blood. Allow one hour for this exam. There are no restrictions or special instructions.  Thank you for choosing [facility]!!

## 2018-01-13 NOTE — Progress Notes (Signed)
Clinical Summary Dave [name] is a 62 y.o.male seen today for follow up of the following medical problems.  1. CAD/Chronic systolic HF - 06/2009 CABG x 3 (LIMA-LAD, SVG-diag, SVG-PDA). He presented with an anterior MI.  - from prior clinic notes, LVEF 40% (do not see echo report) at that time.  -11/2016 echo that showed LVEF 15%, diffuse hypokinesis.  - Jan 2018 cath as reported below, received DES to distal SVG-rPDA. RHC with CI 2, mean PA 39, PCWP 29, LVEDP 22 - 10/2017 echo LVEF 25% - he has turned down ICD - not interested in cardiac rehab.    - repeat echo 10/2017 LVEF 25%.  - no chest pain. No recent SOB/DOE - no recent edema - compliant with meds. Not checking weights home. Limiting sodium intake. No NSAIDs   2. PAD - followed by vascular   3. Carotid stenosis - 2017 [place] RICA 1-39%, LICA 60-79% - no recent symptoms.   Past Medical History:  Diagnosis Date  . Anterior myocardial infarction (HCC) 06/2009   Dave [name] 12/16/2016  . Anxiety   . Anxiety disorder   . Chest pain   . Chronic brain syndrome   . Elevated blood pressure   . Tobacco abuse      No Known Allergies   Current Outpatient Medications  Medication Sig Dispense Refill  . albuterol (PROVENTIL) (2.5 MG/3ML) 0.083% nebulizer solution Inhale 2.5 mg into the lungs every 4 (four) hours as needed for wheezing or shortness of breath.   99  . aspirin EC 81 MG tablet Take 81 mg by mouth daily.    Marland Kitchen atorvastatin (LIPITOR) 80 MG tablet TAKE ONE TABLET BY MOUTH DAILY 90 tablet 3  . furosemide (LASIX) 20 MG tablet TAKE 1 TAB DAILY AS NEEDED FOR SWELLING 90 tablet 3  . metoprolol succinate (TOPROL-XL) 200 MG 24 hr tablet Take 1 tablet daily. 90 tablet 3  . nitroGLYCERIN (NITROSTAT) 0.4 MG SL tablet Place 1 tablet (0.4 mg total) under the tongue every 5 (five) minutes as needed for chest pain. 25 tablet 2  . PROAIR HFA 108 (90 Base) MCG/ACT inhaler Inhale 2 puffs into the lungs 4 (four) times daily as  needed for shortness of breath.     . sacubitril-valsartan (ENTRESTO) 24-26 MG Take 1 tablet by mouth 2 (two) times daily. 28 tablet 0  . ticagrelor (BRILINTA) 90 MG TABS tablet Take 1 tablet (90 mg total) by mouth 2 (two) times daily. 60 tablet 10   No current facility-administered medications for this visit.      Past Surgical History:  Procedure Laterality Date  . CARDIAC CATHETERIZATION  06/2009   Dave [name] 12/16/2016  . CARDIAC CATHETERIZATION N/A 12/18/2016   Procedure: Right/Left Heart Cath and Coronary/Graft Angiography;  Surgeon: Dave Kendall, MD;  Location[facility] INVASIVE CV LAB;  Service: Cardiovascular;  Laterality: N/A;  . CARDIAC CATHETERIZATION N/A 12/18/2016   Procedure: Coronary Stent Intervention;  Surgeon: Dave Kendall, MD;  Location: [facility] INVASIVE CV LAB;  Service: Cardiovascular;  Laterality: N/A;  SVG-PDA  . CORONARY ANGIOPLASTY    . CORONARY ARTERY BYPASS GRAFT  2010   X3  . LAPAROSCOPIC CHOLECYSTECTOMY  2007     No Known Allergies    Family History  Problem Relation Age of Onset  . Coronary artery disease Unknown      Social History Dave [name] reports that he has been smoking.  He has a 44.00 pack-year smoking history. he has never used smokeless tobacco. Dave [name] reports that  he does not drink alcohol.   Review of Systems CONSTITUTIONAL: No weight loss, fever, chills, weakness or fatigue.  HEENT: Eyes: No visual loss, blurred vision, double vision or yellow sclerae.No hearing loss, sneezing, congestion, runny nose or sore throat.  SKIN: No rash or itching.  CARDIOVASCULAR: per hpi RESPIRATORY: per hpi GASTROINTESTINAL: No anorexia, nausea, vomiting or diarrhea. No abdominal pain or blood.  GENITOURINARY: No burning on urination, no polyuria NEUROLOGICAL: No headache, dizziness, syncope, paralysis, ataxia, numbness or tingling in the extremities. No change in bowel or bladder control.  MUSCULOSKELETAL: No muscle, back pain, joint pain or stiffness.   LYMPHATICS: No enlarged nodes. No history of splenectomy.  PSYCHIATRIC: No history of depression or anxiety.  ENDOCRINOLOGIC: No reports of sweating, cold or heat intolerance. No polyuria or polydipsia.  Marland Kitchen.   Physical Examination Vitals:   01/13/18 1105  BP: 120/70  Pulse: 65  SpO2: 96%   Vitals:   01/13/18 1105  Weight: 190 lb (86.2 kg)  Height: 5\' 10"  (1.778 m)    Gen: resting comfortably, no acute distress HEENT: no scleral icterus, pupils equal round and reactive, no palptable cervical adenopathy,  CV: RRR, no m/r/g, no jvd Resp: Clear to auscultation bilaterally GI: abdomen is soft, non-tender, non-distended, normal bowel sounds, no hepatosplenomegaly MSK: extremities are warm, no edema.  Skin: warm, no rash Neuro:  no focal deficits Psych: appropriate affect   Diagnostic Studies 06/2009 cath  IMPRESSION: 1. Severe global left ventricular dysfunction with an ejection fraction of approximately 25% with significant hypocontractility to akinesis involving an inferior wall hypocontractility anterolaterally and significant hypocontractility in the inferolateral wall. 2. Significant multivessel coronary obstructive disease with separate ostium giving rise to the left anterior descending and left circumflex vessels with diffuse 70% ostial proximal left anterior descending stenosis, 60% left anterior descending stenosis after the takeoff of the first septal and diagonal vessel and bifurcation 95% and 80% septal mid left anterior descending stenosis. 3. Large circumflex system with questionable ostial catheter spasm. 4. Total occlusion of the right coronary artery with extensive left-to- right collaterals. 5. An 80% left vertebral artery narrowing arising from the left circumflex system with evidence for plaque in the proximal subclavian system with a widely patent unbypassed left internal mammary  artery. 6. 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  RECOMMENDATIONS: The patient will be evaluated for consideration of CABG revascularization surgery.   Jan 2018 cath Conclusions: 1. Significant 2-vessel native coronary artery disease, including moderate diffuse proximal LAD disease and chronic total occlusion of mid LAD and ostial RCA. 70% proximal D1 stenosis is also present. 2. Moderate, non-obstructive disease involving LCx. Small Devany Aja of OM2 is chronically occluded. 3. Widely patent LIMA->LAD. 4. Patent SVG->rPDA with mild diffuse disease and discrete 95% stenosis at the distal anastomosis. 5. Chronically occluded SVG->D1. 6. Mildly elevated left and right heart filling pressures, as well as moderate pulmonary hypertension. 7. Normal to mildly reduced cardiac output. 8. Successful PCI to distal SVG->rPDA anastomosis using a Resolute Onxy 4.0 x 12 mm drug-eluting stent with 0% residual stenosis and TIMI-3 flow.  Recommendations: 1. Admit for overnight observation following PCI with severe systolic dysfunction. 2. Dual antiplatelet therapy with aspirin and ticagrelor for at least 6-12 months, ideally indefinitely. If patient experiences respiratory side effects from ticagrelor, he could be switched to clopidogrel. 3. Avoid further IV hydration today; restart diuresis tomorrow if renal function allows. 4. Aggressive medical therapy for severe ischemic cardiomyopathy. If renal function  stable, [name] consider starting ACEI tomorrow. 5. Close outpatient follow-up with Dr. Wyline Mood.   10/2017 echo Study Conclusions  - Left ventricle: The cavity size was mildly dilated. Wall thickness was increased in a pattern of mild LVH. Systolic function was severely reduced. The estimated ejection fraction was 25%. Diffuse hypokinesis. There is akinesis of the basal-midinferior myocardium.  Features are consistent with a pseudonormal left ventricular filling pattern, with concomitant abnormal relaxation and increased filling pressure (grade 2 diastolic dysfunction). - Aortic valve: Mildly calcified annulus. Trileaflet. - Mitral valve: Mildly thickened leaflets . There was trivial regurgitation. - Right atrium: Central venous pressure (est): 3 mm Hg. - Tricuspid valve: There was trivial regurgitation. - Pulmonary arteries: PA peak pressure: 25 mm Hg (S). - Pericardium, extracardiac: There was no pericardial effusion.  Impressions:  - Mild LVH with mild chamber dilatation and LVEF approximately 25%. Diffuse hypokinesis with mid to basal inferior akinesis. Grade 2 diastolic dysfunction. Mildly thickened mitral leaflets with trivial mitral regurgitation. Trivial tricuspid regurgitation with PASP 25 mmHg.     Assessment and Plan    1. CAD/ICM/Chronic systolic HF -no recent symptoms - increase entresto to 49/51mg  bid  2. Carotid stenosis - repeat carotid US.    F/u 6 weeks.      Antoine Poche, M.D.

## 2018-01-14 ENCOUNTER — Other Ambulatory Visit: Payer: Self-pay | Admitting: Cardiology

## 2018-01-14 ENCOUNTER — Other Ambulatory Visit: Payer: Self-pay | Admitting: *Deleted

## 2018-01-14 ENCOUNTER — Ambulatory Visit (INDEPENDENT_AMBULATORY_CARE_PROVIDER_SITE_OTHER): Payer: Medicare Other

## 2018-01-14 DIAGNOSIS — I6523 Occlusion and stenosis of bilateral carotid arteries: Secondary | ICD-10-CM

## 2018-01-14 MED ORDER — TICAGRELOR 90 MG PO TABS: 90.0000 mg | ORAL_TABLET | Freq: Two times a day (BID) | ORAL | 1 refills | Status: DC

## 2018-01-14 NOTE — Telephone Encounter (Signed)
Pt did not need refills wanted to make sure he was still to be taking Brilinta - says pharmacy did not give him medication yesterday while he was there. Pt will go back to [facility] to pick up refill of Brilinta

## 2018-01-14 NOTE — Telephone Encounter (Signed)
° ° °  1. Which medications need to be refilled? (please list name of each medication and dose if known)  2. Which pharmacy/location (including street and city if local pharmacy) is medication to be sent to?  [facility]   3. Do they need a 30 day or 90 day supply?    Please call patient to verify    [phone]

## 2018-01-15 LAB — VAS US CAROTID
LCCADDIAS: -29 cm/s
LEFT ECA DIAS: -57 cm/s
LEFT VERTEBRAL DIAS: -19 cm/s
LICADDIAS: -49 cm/s
LICADSYS: -174 cm/s
LICAPSYS: -226 cm/s
Left CCA dist sys: -96 cm/s
Left CCA prox dias: 26 cm/s
Left CCA prox sys: 105 cm/s
Left ICA prox dias: -59 cm/s
RIGHT ECA DIAS: -2 cm/s
RIGHT VERTEBRAL DIAS: -18 cm/s
Right CCA prox dias: 20 cm/s
Right CCA prox sys: 120 cm/s
Right cca dist sys: -110 cm/s

## 2018-01-16 ENCOUNTER — Encounter: Payer: Self-pay | Admitting: Cardiology

## 2018-01-21 ENCOUNTER — Telehealth: Payer: Self-pay | Admitting: *Deleted

## 2018-01-21 NOTE — Telephone Encounter (Signed)
-----   Message from Antoine PocheJonathan F Branch, MD sent at 01/19/2018 11:39 AM EST ----- Stable blockages in the arteries of the neck, we will continue to monitor  Dominga FerryJ Branch MD

## 2018-01-21 NOTE — Telephone Encounter (Signed)
Pt aware - routed to pcp  

## 2018-03-08 ENCOUNTER — Ambulatory Visit: Admit: 2018-03-08 | Payer: PRIVATE HEALTH INSURANCE | Attending: Family Medicine | Primary: Family Medicine

## 2018-03-08 DIAGNOSIS — Z0000 Encounter for general adult medical examination without abnormal findings: Secondary | ICD-10-CM

## 2018-03-08 LAB — AMB POC URINALYSIS DIP STICK AUTO W/O MICRO
Bilirubin (UA POC): NEGATIVE
Glucose (UA POC): NEGATIVE
Ketones (UA POC): NEGATIVE
Leukocyte esterase (UA POC): NEGATIVE
Nitrites (UA POC): NEGATIVE
Protein (UA POC): NEGATIVE
Specific gravity (UA POC): 1.02 (ref 1.001–1.035)
Urobilinogen (UA POC): 0.2 (ref 0.2–1)
pH (UA POC): 5 (ref 4.6–8.0)

## 2018-03-08 LAB — AMB POC HEMOGLOBIN A1C: Hemoglobin A1c (POC): 11.4 % — AB (ref 4.8–5.6)

## 2018-03-08 MED ORDER — METFORMIN 500 MG TAB
500 mg | ORAL_TABLET | Freq: Two times a day (BID) | ORAL | 2 refills | Status: DC
Start: 2018-03-08 — End: 2018-06-15

## 2018-03-08 MED ORDER — ROSUVASTATIN 20 MG TAB
20 mg | ORAL_TABLET | Freq: Every evening | ORAL | 0 refills | Status: AC
Start: 2018-03-08 — End: ?

## 2018-03-08 MED ORDER — LISINOPRIL 10 MG TAB
10 mg | ORAL_TABLET | Freq: Every day | ORAL | 0 refills | Status: AC
Start: 2018-03-08 — End: ?

## 2018-03-08 NOTE — Progress Notes (Signed)
1. Have you been to the ER, urgent care clinic since your last visit?  Hospitalized since your last visit?no    2. Have you seen or consulted any other health care providers outside of the [facility] since your last visit?  Include any pap smears or colon screening. no

## 2018-03-08 NOTE — Progress Notes (Signed)
Lipids-You have a low HDL. This can improve by increasing physical activity.  CMP-Normal electrolyte levels except for an elevation in the glucose level, normal renal and liver function  A1c-11.4  All other lab results are normal

## 2018-03-08 NOTE — Progress Notes (Signed)
SUBJECTIVE:   Anthony [name] is a 62 y.o. male who is here for complete physical exam.    Pt reports he had a cardiac [facility] one [facility] ago [facility]. Pt reports he had an abnormal EKG at Dr. Vear ClockPhillips' (cardiology) office followed by a cardiac stress test. Pt reports he is doing well following the procedure. Pt last saw Dr. Vear ClockPhillips one week ago and has a 61mo follow up. Pt reports his rosuvastatin dose was increased from 10mg  to 20mg  and he was started on lisinopril 10mg  at the time of his procedure. Pt has not had updated labs since the hospital.    Pt reports elevated blood glucose readings x a few months. Pt reports his blood sugar reading was 174 this morning. Pt reports there was period of time he was not eating well. One month ago he made dietary changes. Pt reports he was given information for a nutritionist at [facility] following his stent procedure. Pt's 8/18 A1c was 6.8%. Pt's POC A1c today in the office was 11.4%.     Pt c/o constant numbness in his L foot. He reports L hip pain at night and intermittent lower back pain. Pt reports the numbness has improved since his last visit in 07/2017.     Pt c/o a mole on his buttocks that is starting to irritate him while sitting. He state is feels raw. Pt reports the lesion has been present for years. Pt denies itching, pain.    Pt c/o an increase in urinary frequency.     Pt reports he saw ENT last week for further evaluation of tinnitus. Pt is considering a hearing aid to reduce the tinnitus.     Pt reports he has been successful with smoking cessation x 04/2017.     Pt denies seasonal allergies.     PREVENTIVE:  Colonoscopy: 12/24/16, Dr. Nedra HaiLee, [id number] f/u  PSA: 01/14/17, 1.2  Pneumonia vaccine: PPSV-23: 07/22/17  Eye Exam: 02/2018    Pt specifically denies changes in vision or hearing, trouble with swallowing or taste, CP, SOB, heartburn or upset stomach, change in bowel habits, unusual joint or muscle pains.     At this time, he is otherwise doing well and has brought no other complaints to my attention today. For a list of the medical issues addressed today, see the assessment and plan below.      PMH:   Past Medical History:   Diagnosis Date   ??? CAD (coronary artery disease)     heart attack 2011   ??? Chronic obstructive pulmonary disease (HCC)    ??? Hypercholesterolemia    ??? Hypertension        PSH:  has a past surgical history that includes pr cardiac surg procedure unlist and hx other surgical (02/05/2018).    Allergies: has No Known Allergies.    Meds:   Current Outpatient Medications   Medication Sig   ??? rosuvastatin (CRESTOR) 20 mg tablet Take 1 Tab by mouth nightly.   ??? lisinopril (PRINIVIL, ZESTRIL) 10 mg tablet Take 1 Tab by mouth daily.   ??? cyclobenzaprine (FLEXERIL) 10 mg tablet TAKE 1 TABLET BY MOUTH 3 TIMES A DAY AS NEEDED FOR MUSCLE SPASMS   ??? metFORMIN ER (GLUCOPHAGE XR) 500 mg tablet TAKE 1 TABLET BY MOUTH EVERY DAY   ??? glucose blood VI test strips (ACCU-CHEK SMARTVIEW TEST STRIP) strip Test blood sugar every morning fasting.   ??? glucose blood VI test strips (ACCU-CHEK SMARTVIEW TEST STRIP) strip  by Does Not Apply route See Admin Instructions.   ??? metoprolol succinate (TOPROL-XL) 50 mg XL tablet Take 50 mg by mouth daily.   ??? omega-3 fatty acids-vitamin e 1,000 mg cap Take 1 Cap by mouth daily.   ??? aspirin delayed-release 81 mg tablet Take 81 mg by mouth daily.   ??? multivitamin (ONE A DAY) tablet Take 1 Tab by mouth daily.   ??? glucosamine-chondroitin (ARTHX) 500-400 mg cap Take 1 Cap by mouth daily.   ??? CHANTIX 1 mg tablet TAKE 1 TABLET BY MOUTH TWICE A DAY FOR 60 DAYS     No current facility-administered medications for this visit.        Fam hx: family history includes Diabetes in his brother; Heart Disease in his father; Hypertension in his mother.    Soc hx:  reports that he quit smoking about 12 months ago. He has a 40.00 pack-year smoking history. He has never used smokeless tobacco. He reports  that he does not drink alcohol or use drugs.      Review of Systems - History obtained from the patient  General ROS: negative  Psychological ROS: negative  Ophthalmic ROS: negative  ENT ROS: negative  Respiratory ROS: no cough, shortness of breath, or wheezing  Cardiovascular ROS: no chest pain or dyspnea on exertion  Gastrointestinal ROS: no abdominal pain, change in bowel habits, or black or bloody stools  Genito-Urinary ROS: urinary frequency  Musculoskeletal ROS: negative  Neurological ROS: numbness L foot  Dermatological ROS: mole on buttocks    OBJECTIVE:   Vitals:   Visit Vitals  BP 107/77 (BP 1 Location: Left arm, BP Patient Position: Sitting)   Pulse (!) 54   Temp 98.8 ??F (37.1 ??C) (Oral)   Resp 16   Ht 5\' 10"  (1.778 m)   Wt 237 lb 12.8 oz (107.9 kg)   SpO2 100%   BMI 34.12 kg/m??     Gen: Pleasant 62 y.o. male in NAD.   HEENT: PERRLA. EOMI. OP moist and pink.    EARS: TMs normal and canals equal bilaterally.    NECK: Supple.  No LAD. No thyromegaly.    HEART: RRR, No M/G/R.     LUNGS: CTAB No W/R.    ABDOMEN: S, NT, ND, BS+.     EXTREMITIES: Warm. No C/C/E.    MUSCULOSKELETAL: Normal ROM, muscle strength 5/5 all groups.    NEURO: Alert and oriented x 3.  Cranial nerves grossly intact.  No focal sensory or motor deficits noted.   SKIN: + medial R buttock: 2 pendulous skin tags, no excoriation, mild erythema and dryness + lesion on forehead: macular, erythematous, round, relatively new    ASSESSMENT/ PLAN:     Diagnoses and all orders for this visit:    1. Annual physical exam  -     AMB POC URINALYSIS DIP STICK AUTO W/O MICRO  -     rosuvastatin (CRESTOR) 20 mg tablet; Take 1 Tab by mouth nightly.  -     lisinopril (PRINIVIL, ZESTRIL) 10 mg tablet; Take 1 Tab by mouth daily.  -     AMB POC HEMOGLOBIN A1C  -     METABOLIC PANEL, COMPREHENSIVE  -     CBC WITH AUTOMATED DIFF  -     LIPID PANEL  -     PSA, DIAGNOSTIC (PROSTATE SPECIFIC AG)  -     REFERRAL TO PODIATRY  -     REFERRAL TO DERMATOLOGY   -  REFERRAL TO UROLOGY      1. Annual physical exam  Anthony [name]'s physical exam was normal. Pt's UA revealed trace blood but was otherwise normal. Pt's POC A1c was 11.4%. I will follow up with a new medication regimen for blood glucose control and asked pt to regularly monitor his readings at home. Pt was given lab orders for a CMP, CBC, lipid panel, PSA to have done.     I referred pt to Dr. Charlies Silvers (podiatry) for a foot exam and further evaluation of his L foot numbness.    I referred pt to Dr. Laveda Norman (dermatology) for a skin exam.     I referred pt to Dr. Angela Burke (urology) for further evaluation.    Pt's records from Dr. Vear Clock and [facility] were requested for review.       Follow-up and Dispositions    ?? Return in about 6 months (around 09/07/2018) for follow up.         I have reviewed the patient's medications and risks/side effects/benefits were discussed. Diagnosis(-es) explained to patient and questions answered. Literature provided where appropriate.     Written by Marshell Garfinkel, [employer], as dictated by Jackolyn Confer, MD.

## 2018-03-09 LAB — CBC WITH AUTOMATED DIFF
ABS. BASOPHILS: 0 10*3/uL (ref 0.0–0.2)
ABS. EOSINOPHILS: 0.2 10*3/uL (ref 0.0–0.4)
ABS. IMM. GRANS.: 0 10*3/uL (ref 0.0–0.1)
ABS. MONOCYTES: 0.4 10*3/uL (ref 0.1–0.9)
ABS. NEUTROPHILS: 2.1 10*3/uL (ref 1.4–7.0)
Abs Lymphocytes: 1.6 10*3/uL (ref 0.7–3.1)
BASOPHILS: 1 %
EOSINOPHILS: 4 %
HCT: 40.4 % (ref 37.5–51.0)
HGB: 13.6 g/dL (ref 13.0–17.7)
IMMATURE GRANULOCYTES: 0 %
Lymphocytes: 37 %
MCH: 31.7 pg (ref 26.6–33.0)
MCHC: 33.7 g/dL (ref 31.5–35.7)
MCV: 94 fL (ref 79–97)
MONOCYTES: 9 %
NEUTROPHILS: 49 %
PLATELET: 228 10*3/uL (ref 150–379)
RBC: 4.29 x10E6/uL (ref 4.14–5.80)
RDW: 13.1 % (ref 12.3–15.4)
WBC: 4.3 10*3/uL (ref 3.4–10.8)

## 2018-03-09 LAB — METABOLIC PANEL, COMPREHENSIVE
A-G Ratio: 2 (ref 1.2–2.2)
ALT (SGPT): 18 IU/L (ref 0–44)
AST (SGOT): 15 IU/L (ref 0–40)
Albumin: 4.5 g/dL (ref 3.6–4.8)
Alk. phosphatase: 52 IU/L (ref 39–117)
BUN/Creatinine ratio: 14 (ref 10–24)
BUN: 11 mg/dL (ref 8–27)
Bilirubin, total: 0.4 mg/dL (ref 0.0–1.2)
CO2: 21 mmol/L (ref 20–29)
Calcium: 9.8 mg/dL (ref 8.6–10.2)
Chloride: 101 mmol/L (ref 96–106)
Creatinine: 0.76 mg/dL (ref 0.76–1.27)
GFR est AA: 115 mL/min/{1.73_m2} (ref >59)
GFR est non-AA: 99 mL/min/{1.73_m2} (ref >59)
GLOBULIN, TOTAL: 2.3 g/dL (ref 1.5–4.5)
Glucose: 185 mg/dL — ABNORMAL HIGH (ref 65–99)
Potassium: 4.4 mmol/L (ref 3.5–5.2)
Protein, total: 6.8 g/dL (ref 6.0–8.5)
Sodium: 139 mmol/L (ref 134–144)

## 2018-03-09 LAB — LIPID PANEL
Cholesterol, total: 110 mg/dL (ref 100–199)
HDL Cholesterol: 32 mg/dL — ABNORMAL LOW (ref >39)
LDL, calculated: 60 mg/dL (ref 0–99)
Triglyceride: 89 mg/dL (ref 0–149)
VLDL, calculated: 18 mg/dL (ref 5–40)

## 2018-03-09 LAB — PSA, DIAGNOSTIC (PROSTATE SPECIFIC AG): Prostate Specific Ag: 1 ng/mL (ref 0.0–4.0)

## 2018-03-10 DIAGNOSIS — I429 Cardiomyopathy, unspecified: Secondary | ICD-10-CM | POA: Diagnosis not present

## 2018-03-10 DIAGNOSIS — Z299 Encounter for prophylactic measures, unspecified: Secondary | ICD-10-CM | POA: Diagnosis not present

## 2018-03-10 DIAGNOSIS — I739 Peripheral vascular disease, unspecified: Secondary | ICD-10-CM | POA: Diagnosis not present

## 2018-03-10 DIAGNOSIS — J449 Chronic obstructive pulmonary disease, unspecified: Secondary | ICD-10-CM | POA: Diagnosis not present

## 2018-03-10 DIAGNOSIS — I25119 Atherosclerotic heart disease of native coronary artery with unspecified angina pectoris: Secondary | ICD-10-CM | POA: Diagnosis not present

## 2018-03-10 DIAGNOSIS — F17210 Nicotine dependence, cigarettes, uncomplicated: Secondary | ICD-10-CM | POA: Diagnosis not present

## 2018-03-10 DIAGNOSIS — I472 Ventricular tachycardia: Secondary | ICD-10-CM | POA: Diagnosis not present

## 2018-03-10 DIAGNOSIS — Z6828 Body mass index (BMI) 28.0-28.9, adult: Secondary | ICD-10-CM | POA: Diagnosis not present

## 2018-03-15 ENCOUNTER — Ambulatory Visit (INDEPENDENT_AMBULATORY_CARE_PROVIDER_SITE_OTHER): Payer: Medicare Other | Admitting: Cardiology

## 2018-03-15 VITALS — BP 130/81 | HR 69 | Ht 70.0 in | Wt 194.2 lb

## 2018-03-15 DIAGNOSIS — I5022 Chronic systolic (congestive) heart failure: Secondary | ICD-10-CM

## 2018-03-15 DIAGNOSIS — I2510 Atherosclerotic heart disease of native coronary artery without angina pectoris: Secondary | ICD-10-CM | POA: Diagnosis not present

## 2018-03-15 DIAGNOSIS — I6523 Occlusion and stenosis of bilateral carotid arteries: Secondary | ICD-10-CM

## 2018-03-15 MED ORDER — SACUBITRIL-VALSARTAN 97-103 MG PO TABS: 1.0000 | ORAL_TABLET | Freq: Two times a day (BID) | ORAL | 3 refills | Status: DC

## 2018-03-15 NOTE — Patient Instructions (Signed)
Your physician recommends that you schedule a follow-up appointment in: [facility]  Your physician has recommended you make the following change in your medication:   INCREASE ENTRESTO 97/103 MG TWICE DAILY  Your physician recommends that you return for lab work in: 2 WEEKS BMP - WE HAVE GIVEN YOU ORDERS TODAY  Thank you for choosing [facility]!!

## 2018-03-15 NOTE — Progress Notes (Signed)
Clinical Summary Dave [name] is a 62 y.o.male seen today for follow up of the following medical problems.  1. CAD/Chronic systolic HF - 06/2009 CABG x 3 (LIMA-LAD, SVG-diag, SVG-PDA). He presented with an anterior MI.  - from prior clinic notes, LVEF 40% (do not see echo report) at that time.  -11/2016 echo that showed LVEF 15%, diffuse hypokinesis.  - Jan 2018 cath as reported below, received DES to distal SVG-rPDA. RHC with CI 2, mean PA 39, PCWP 29, LVEDP 22 - 10/2017 echo LVEF 25% - he has turned down ICD - not interested in cardiac rehab.  - intervention cards recs for indefinitie brillinta.  - last visit we increased entresto to 49/51mg  bid on 01/13/18. Tolerating without side effects - no recent SOB/DOE/LE edema or chest pain.      2. PAD - followed by vascular   3. Carotid stenosis - 2017 [place] RICA 1-39%, LICA 60-79% - denies any neuro symptoms    Past Medical History:  Diagnosis Date  . Anterior myocardial infarction (HCC) 06/2009   Dave [name] 12/16/2016  . Anxiety   . Anxiety disorder   . Chest pain   . Chronic brain syndrome   . Elevated blood pressure   . Tobacco abuse      No Known Allergies   Current Outpatient Medications  Medication Sig Dispense Refill  . albuterol (PROVENTIL) (2.5 MG/3ML) 0.083% nebulizer solution Inhale 2.5 mg into the lungs every 4 (four) hours as needed for wheezing or shortness of breath.   99  . aspirin EC 81 MG tablet Take 81 mg by mouth daily.    Marland Kitchen atorvastatin (LIPITOR) 80 MG tablet TAKE ONE TABLET BY MOUTH DAILY 90 tablet 3  . furosemide (LASIX) 20 MG tablet TAKE 1 TAB DAILY AS NEEDED FOR SWELLING 90 tablet 3  . metoprolol succinate (TOPROL-XL) 200 MG 24 hr tablet Take 1 tablet daily. 90 tablet 3  . nitroGLYCERIN (NITROSTAT) 0.4 MG SL tablet Place 1 tablet (0.4 mg total) under the tongue every 5 (five) minutes as needed for chest pain. 25 tablet 2  . PROAIR HFA 108 (90 Base) MCG/ACT inhaler Inhale 2 puffs into the  lungs 4 (four) times daily as needed for shortness of breath.     . sacubitril-valsartan (ENTRESTO) 49-51 MG Take 1 tablet by mouth 2 (two) times daily. 60 tablet 3  . ticagrelor (BRILINTA) 90 MG TABS tablet Take 1 tablet (90 mg total) by mouth 2 (two) times daily. 180 tablet 1   No current facility-administered medications for this visit.      Past Surgical History:  Procedure Laterality Date  . CARDIAC CATHETERIZATION  06/2009   Dave [name] 12/16/2016  . CARDIAC CATHETERIZATION N/A 12/18/2016   Procedure: Right/Left Heart Cath and Coronary/Graft Angiography;  Surgeon: Yvonne Kendall, MD;  Location: [facility] INVASIVE CV LAB;  Service: Cardiovascular;  Laterality: N/A;  . CARDIAC CATHETERIZATION N/A 12/18/2016   Procedure: Coronary Stent Intervention;  Surgeon: Yvonne Kendall, MD;  Location: [facility] INVASIVE CV LAB;  Service: Cardiovascular;  Laterality: N/A;  SVG-PDA  . CORONARY ANGIOPLASTY    . CORONARY ARTERY BYPASS GRAFT  2010   X3  . LAPAROSCOPIC CHOLECYSTECTOMY  2007     No Known Allergies    Family History  Problem Relation Age of Onset  . Coronary artery disease Unknown      Social History Dave [name] reports that he has been smoking.  He has a 44.00 pack-year smoking history. He has never used smokeless tobacco. Mr.  Su [name] reports that he does not drink alcohol.   Review of Systems CONSTITUTIONAL: No weight loss, fever, chills, weakness or fatigue.  HEENT: Eyes: No visual loss, blurred vision, double vision or yellow sclerae.No hearing loss, sneezing, congestion, runny nose or sore throat.  SKIN: No rash or itching.  CARDIOVASCULAR: per hpi RESPIRATORY: No shortness of breath, cough or sputum.  GASTROINTESTINAL: No anorexia, nausea, vomiting or diarrhea. No abdominal pain or blood.  GENITOURINARY: No burning on urination, no polyuria NEUROLOGICAL: No headache, dizziness, syncope, paralysis, ataxia, numbness or tingling in the extremities. No change in bowel or bladder control.    MUSCULOSKELETAL: No muscle, back pain, joint pain or stiffness.  LYMPHATICS: No enlarged nodes. No history of splenectomy.  PSYCHIATRIC: No history of depression or anxiety.  ENDOCRINOLOGIC: No reports of sweating, cold or heat intolerance. No polyuria or polydipsia.  Marland Kitchen.   Physical Examination Vitals:   03/15/18 1533  BP: 130/81  Pulse: 69  SpO2: 96%   Filed Weights   03/15/18 1533  Weight: 194 lb 3.2 oz (88.1 kg)    Gen: resting comfortably, no acute distress HEENT: no scleral icterus, pupils equal round and reactive, no palptable cervical adenopathy,  CV: RRR, no m/r/g, no jvd Resp: Clear to auscultation bilaterally GI: abdomen is soft, non-tender, non-distended, normal bowel sounds, no hepatosplenomegaly MSK: extremities are warm, no edema.  Skin: warm, no rash Neuro:  no focal deficits Psych: appropriate affect   Diagnostic Studies 06/2009 cath  IMPRESSION: 1. Severe global left ventricular dysfunction with an ejection fraction of approximately 25% with significant hypocontractility to akinesis involving an inferior wall hypocontractility anterolaterally and significant hypocontractility in the inferolateral wall. 2. Significant multivessel coronary obstructive disease with separate ostium giving rise to the left anterior descending and left circumflex vessels with diffuse 70% ostial proximal left anterior descending stenosis, 60% left anterior descending stenosis after the takeoff of the first septal and diagonal vessel and bifurcation 95% and 80% septal mid left anterior descending stenosis. 3. Large circumflex system with questionable ostial catheter spasm. 4. Total occlusion of the right coronary artery with extensive left-to- right collaterals. 5. An 80% left vertebral artery narrowing arising from the left circumflex system with evidence for plaque in the proximal subclavian system with a  widely patent unbypassed left internal mammary artery. 6. 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  RECOMMENDATIONS: The patient will be evaluated for consideration of CABG revascularization surgery.   Jan 2018 cath Conclusions: 1. Significant 2-vessel native coronary artery disease, including moderate diffuse proximal LAD disease and chronic total occlusion of mid LAD and ostial RCA. 70% proximal D1 stenosis is also present. 2. Moderate, non-obstructive disease involving LCx. Small Amoura Ransier of OM2 is chronically occluded. 3. Widely patent LIMA->LAD. 4. Patent SVG->rPDA with mild diffuse disease and discrete 95% stenosis at the distal anastomosis. 5. Chronically occluded SVG->D1. 6. Mildly elevated left and right heart filling pressures, as well as moderate pulmonary hypertension. 7. Normal to mildly reduced cardiac output. 8. Successful PCI to distal SVG->rPDA anastomosis using a Resolute Onxy 4.0 x 12 mm drug-eluting stent with 0% residual stenosis and TIMI-3 flow.  Recommendations: 1. Admit for overnight observation following PCI with severe systolic dysfunction. 2. Dual antiplatelet therapy with aspirin and ticagrelor for at least 6-12 months, ideally indefinitely. If patient experiences respiratory side effects from ticagrelor, he could be switched to clopidogrel. 3. Avoid further IV hydration today; restart diuresis tomorrow if renal function allows. 4. Aggressive medical therapy  for severe ischemic cardiomyopathy. If renal function stable, can consider starting ACEI tomorrow. 5. Close outpatient follow-up with Dr. Wyline Mood.   10/2017 echo Study Conclusions  - Left ventricle: The cavity size was mildly dilated. Wall thickness was increased in a pattern of mild LVH. Systolic function was severely reduced. The estimated ejection fraction was 25%. Diffuse hypokinesis. There  is akinesis of the basal-midinferior myocardium. Features are consistent with a pseudonormal left ventricular filling pattern, with concomitant abnormal relaxation and increased filling pressure (grade 2 diastolic dysfunction). - Aortic valve: Mildly calcified annulus. Trileaflet. - Mitral valve: Mildly thickened leaflets . There was trivial regurgitation. - Right atrium: Central venous pressure (est): 3 mm Hg. - Tricuspid valve: There was trivial regurgitation. - Pulmonary arteries: PA peak pressure: 25 mm Hg (S). - Pericardium, extracardiac: There was no pericardial effusion.  Impressions:  - Mild LVH with mild chamber dilatation and LVEF approximately 25%. Diffuse hypokinesis with mid to basal inferior akinesis. Grade 2 diastolic dysfunction. Mildly thickened mitral leaflets with trivial mitral regurgitation. Trivial tricuspid regurgitation with PASP 25 mmHg.      Assessment and Plan  1. CAD/ICM/Chronic systolic HF -no symptoms - increase entresto to 97/103mg  bid. Check BMET in 2 weeks      F/u 2 months       Antoine Poche, M.D., F.A.C.C.

## 2018-03-19 ENCOUNTER — Encounter: Payer: Self-pay | Admitting: Cardiology

## 2018-04-02 ENCOUNTER — Encounter

## 2018-04-02 MED ORDER — METFORMIN SR 500 MG 24 HR TABLET
500 mg | ORAL_TABLET | ORAL | 4 refills | Status: DC
Start: 2018-04-02 — End: 2018-06-25

## 2018-05-17 ENCOUNTER — Encounter: Payer: Self-pay | Admitting: *Deleted

## 2018-05-18 ENCOUNTER — Encounter: Payer: Self-pay | Admitting: Cardiology

## 2018-05-18 ENCOUNTER — Ambulatory Visit (INDEPENDENT_AMBULATORY_CARE_PROVIDER_SITE_OTHER): Payer: Medicare Other | Admitting: Cardiology

## 2018-05-18 VITALS — BP 112/64 | HR 67 | Ht 70.0 in | Wt 191.2 lb

## 2018-05-18 DIAGNOSIS — I5022 Chronic systolic (congestive) heart failure: Secondary | ICD-10-CM

## 2018-05-18 DIAGNOSIS — I2510 Atherosclerotic heart disease of native coronary artery without angina pectoris: Secondary | ICD-10-CM

## 2018-05-18 DIAGNOSIS — I255 Ischemic cardiomyopathy: Secondary | ICD-10-CM

## 2018-05-18 NOTE — Progress Notes (Signed)
Clinical Summary Dave [name] is a 62 y.o.male  seen today for follow up of the following medical problems.  1. CAD/Chronic systolic HF - 06/2009 CABG x 3 (LIMA-LAD, SVG-diag, SVG-PDA). He presented with an anterior MI.  - from prior clinic notes, LVEF 40% (do not see echo report) at that time.  -11/2016 echo that showed LVEF 15%, diffuse hypokinesis.  - Jan 2018 cath as reported below, received DES to distal SVG-rPDA. RHC with CI 2, mean PA 39, PCWP 29, LVEDP 22 - 10/2017 echo LVEF 25% - he has turned down ICD - not interested in cardiac rehab.  - intervention cards recs for indefinitie brillinta.    - last visit we increased entresto to 97/103mg  bid.  - no chest pain. +cough, productive white mucous few weeks. +allergies. No recent edema   2. PAD - followed by vascular   3. Carotid stenosis -01/2018 carotid US: RICA 1-39%, LICA 60-79%.  - denies any neuro symptoms    Past Medical History:  Diagnosis Date  . Anterior myocardial infarction (HCC) 06/2009   [name] 12/16/2016  . Anxiety   . Anxiety disorder   . Chest pain   . Chronic brain syndrome   . Elevated blood pressure   . Tobacco abuse      No Known Allergies   Current Outpatient Medications  Medication Sig Dispense Refill  . albuterol (PROVENTIL) (2.5 MG/3ML) 0.083% nebulizer solution Inhale 2.5 mg into the lungs every 4 (four) hours as needed for wheezing or shortness of breath.   99  . aspirin EC 81 MG tablet Take 81 mg by mouth daily.    Marland Kitchen atorvastatin (LIPITOR) 80 MG tablet TAKE ONE TABLET BY MOUTH DAILY 90 tablet 3  . furosemide (LASIX) 20 MG tablet TAKE 1 TAB DAILY AS NEEDED FOR SWELLING 90 tablet 3  . metoprolol succinate (TOPROL-XL) 200 MG 24 hr tablet Take 1 tablet daily. 90 tablet 3  . nitroGLYCERIN (NITROSTAT) 0.4 MG SL tablet Place 1 tablet (0.4 mg total) under the tongue every 5 (five) minutes as needed for chest pain. 25 tablet 2  . PROAIR HFA 108 (90 Base) MCG/ACT inhaler Inhale 2  puffs into the lungs 4 (four) times daily as needed for shortness of breath.     . sacubitril-valsartan (ENTRESTO) 97-103 MG Take 1 tablet by mouth 2 (two) times daily. 60 tablet 3  . ticagrelor (BRILINTA) 90 MG TABS tablet Take 1 tablet (90 mg total) by mouth 2 (two) times daily. 180 tablet 1   No current facility-administered medications for this visit.      Past Surgical History:  Procedure Laterality Date  . CARDIAC CATHETERIZATION  06/2009   [name] 12/16/2016  . CARDIAC CATHETERIZATION N/A 12/18/2016   Procedure: Right/Left Heart Cath and Coronary/Graft Angiography;  Surgeon: Yvonne Kendall, MD;  Location: [facility] INVASIVE CV LAB;  Service: Cardiovascular;  Laterality: N/A;  . CARDIAC CATHETERIZATION N/A 12/18/2016   Procedure: Coronary Stent Intervention;  Surgeon: Yvonne Kendall, MD;  Location: [facility] INVASIVE CV LAB;  Service: Cardiovascular;  Laterality: N/A;  SVG-PDA  . CORONARY ANGIOPLASTY    . CORONARY ARTERY BYPASS GRAFT  2010   X3  . LAPAROSCOPIC CHOLECYSTECTOMY  2007     No Known Allergies    Family History  Problem Relation Age of Onset  . Coronary artery disease Unknown      Social History Mr. Nowling reports that he has been smoking.  He has a 44.00 pack-year smoking history. He has never used smokeless  tobacco. Mr. Neubert reports that he does not drink alcohol.   Review of Systems CONSTITUTIONAL: No weight loss, fever, chills, weakness or fatigue.  HEENT: Eyes: No visual loss, blurred vision, double vision or yellow sclerae.No hearing loss, sneezing, congestion, runny nose or sore throat.  SKIN: No rash or itching.  CARDIOVASCULAR: per hpi RESPIRATORY: per hpi GASTROINTESTINAL: No anorexia, nausea, vomiting or diarrhea. No abdominal pain or blood.  GENITOURINARY: No burning on urination, no polyuria NEUROLOGICAL: No headache, dizziness, syncope, paralysis, ataxia, numbness or tingling in the extremities. No change in bowel or bladder control.  MUSCULOSKELETAL:  No muscle, back pain, joint pain or stiffness.  LYMPHATICS: No enlarged nodes. No history of splenectomy.  PSYCHIATRIC: No history of depression or anxiety.  ENDOCRINOLOGIC: No reports of sweating, cold or heat intolerance. No polyuria or polydipsia.  Marland Kitchen   Physical Examination Vitals:   05/18/18 1402  BP: 112/64  Pulse: 67  SpO2: 96%   Vitals:   05/18/18 1402  Weight: 191 lb 3.2 oz (86.7 kg)  Height: 5\' 10"  (1.778 m)    Gen: resting comfortably, no acute distress HEENT: no scleral icterus, pupils equal round and reactive, no palptable cervical adenopathy,  CV: RRR, no mr/g, no jvd Resp: Clear to auscultation bilaterally GI: abdomen is soft, non-tender, non-distended, normal bowel sounds, no hepatosplenomegaly MSK: extremities are warm, no edema.  Skin: warm, no rash Neuro:  no focal deficits Psych: appropriate affect   Diagnostic Studies 06/2009 cath  IMPRESSION: 1. Severe global left ventricular dysfunction with an ejection fraction of approximately 25% with significant hypocontractility to akinesis involving an inferior wall hypocontractility anterolaterally and significant hypocontractility in the inferolateral wall. 2. Significant multivessel coronary obstructive disease with separate ostium giving rise to the left anterior descending and left circumflex vessels with diffuse 70% ostial proximal left anterior descending stenosis, 60% left anterior descending stenosis after the takeoff of the first septal and diagonal vessel and bifurcation 95% and 80% septal mid left anterior descending stenosis. 3. Large circumflex system with questionable ostial catheter spasm. 4. Total occlusion of the right coronary artery with extensive left-to- right collaterals. 5. An 80% left vertebral artery narrowing arising from the left circumflex system with evidence for plaque in the proximal subclavian system with a  widely patent unbypassed left internal mammary artery. 6. 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  RECOMMENDATIONS: The patient will be evaluated for consideration of CABG revascularization surgery.   Jan 2018 cath Conclusions: 1. Significant 2-vessel native coronary artery disease, including moderate diffuse proximal LAD disease and chronic total occlusion of mid LAD and ostial RCA. 70% proximal D1 stenosis is also present. 2. Moderate, non-obstructive disease involving LCx. Small Delina Kruczek of OM2 is chronically occluded. 3. Widely patent LIMA->LAD. 4. Patent SVG->rPDA with mild diffuse disease and discrete 95% stenosis at the distal anastomosis. 5. Chronically occluded SVG->D1. 6. Mildly elevated left and right heart filling pressures, as well as moderate pulmonary hypertension. 7. Normal to mildly reduced cardiac output. 8. Successful PCI to distal SVG->rPDA anastomosis using a Resolute Onxy 4.0 x 12 mm drug-eluting stent with 0% residual stenosis and TIMI-3 flow.  Recommendations: 1. Admit for overnight observation following PCI with severe systolic dysfunction. 2. Dual antiplatelet therapy with aspirin and ticagrelor for at least 6-12 months, ideally indefinitely. If patient experiences respiratory side effects from ticagrelor, he could be switched to clopidogrel. 3. Avoid further IV hydration today; restart diuresis tomorrow if renal function allows. 4. Aggressive medical therapy  for severe ischemic cardiomyopathy. If renal function stable, can consider starting ACEI tomorrow. 5. Close outpatient follow-up with Dr. Wyline MoodBranch.   10/2017 echo Study Conclusions  - Left ventricle: The cavity size was mildly dilated. Wall thickness was increased in a pattern of mild LVH. Systolic function was severely reduced. The estimated ejection fraction was 25%. Diffuse hypokinesis. There  is akinesis of the basal-midinferior myocardium. Features are consistent with a pseudonormal left ventricular filling pattern, with concomitant abnormal relaxation and increased filling pressure (grade 2 diastolic dysfunction). - Aortic valve: Mildly calcified annulus. Trileaflet. - Mitral valve: Mildly thickened leaflets . There was trivial regurgitation. - Right atrium: Central venous pressure (est): 3 mm Hg. - Tricuspid valve: There was trivial regurgitation. - Pulmonary arteries: PA peak pressure: 25 mm Hg (S). - Pericardium, extracardiac: There was no pericardial effusion.  Impressions:  - Mild LVH with mild chamber dilatation and LVEF approximately 25%. Diffuse hypokinesis with mid to basal inferior akinesis. Grade 2 diastolic dysfunction. Mildly thickened mitral leaflets with trivial mitral regurgitation. Trivial tricuspid regurgitation with PASP 25 mmHg.       Assessment and Plan  1. CAD/ICM/Chronic systolic HF -no recent symptoms, has tolerated titration of CHF meds - continue current meds at this time. Obtain BMET and Mg labs.  - he remains not interested in ICD   F/u 4 months        Antoine PocheJonathan F. Letina Luckett, M.D.

## 2018-05-18 NOTE — Patient Instructions (Signed)
Your physician recommends that you schedule a follow-up appointment in: 4 MONTHS WITH DR BRANCH  Your physician recommends that you continue on your current medications as directed. Please refer to the Current Medication list given to you today.  Your physician recommends that you return for lab work BMP/MG  Thank you for choosing [facility]!!    

## 2018-05-19 DIAGNOSIS — I255 Ischemic cardiomyopathy: Secondary | ICD-10-CM | POA: Diagnosis not present

## 2018-05-24 ENCOUNTER — Encounter: Payer: Self-pay | Admitting: Cardiology

## 2018-06-15 ENCOUNTER — Encounter

## 2018-06-16 MED ORDER — METFORMIN 500 MG TAB
500 mg | ORAL_TABLET | ORAL | 6 refills | Status: DC
Start: 2018-06-16 — End: 2019-10-17

## 2018-06-25 ENCOUNTER — Ambulatory Visit
Admit: 2018-06-25 | Discharge: 2018-06-25 | Payer: PRIVATE HEALTH INSURANCE | Attending: Family Medicine | Primary: Family Medicine

## 2018-06-25 ENCOUNTER — Encounter

## 2018-06-25 ENCOUNTER — Ambulatory Visit: Attending: Family Medicine | Primary: Family Medicine

## 2018-06-25 DIAGNOSIS — E1165 Type 2 diabetes mellitus with hyperglycemia: Secondary | ICD-10-CM

## 2018-06-25 MED ORDER — CYCLOBENZAPRINE 10 MG TAB
10 mg | ORAL_TABLET | ORAL | 1 refills | Status: DC
Start: 2018-06-25 — End: 2019-01-18

## 2018-06-25 NOTE — Progress Notes (Signed)
 Identified pt with two pt identifiers(name and DOB). Reviewed record in preparation for visit and have obtained necessary documentation.  Chief Complaint   Patient presents with   . Diabetes     3 month f/u    . Eye Problem     Pt reports not being able to see with glasses on.       Visit Vitals  BP 107/74 (BP 1 Location: Left arm, BP Patient Position: Sitting)   Pulse 82   Temp 98.4 F (36.9 C) (Oral)   Resp 18   Ht 5' 10 (1.778 m)   Wt 242 lb (109.8 kg)   SpO2 94%   BMI 34.72 kg/m     Health Maintenance Due   Topic   . FOOT EXAM Q1    . EYE EXAM RETINAL OR DILATED    . DTaP/Tdap/Td series (1 - Tdap)   . Shingrix Vaccine Age 59> (1 of 2)   . FOBT Q 1 YEAR AGE 86-75    . MICROALBUMIN Q1        Coordination of Care Questionnaire:  :   1) Have you been to an emergency room, urgent care, or hospitalized since your last visit? If yes, where when, and reason for visit?  No         2. Have seen or consulted any other health care provider since your last visit?   If yes, where when, and reason for visit? No       3) Do you have an Advanced Directive/ Living Will in place? No  If yes, do we have a copy on file   If no, would you like information     Patient is accompanied by self I have received verbal consent from Anthony [name] to discuss any/all medical information while they are present in the room.

## 2018-06-25 NOTE — Addendum Note (Signed)
Addendum  Note by Daine FlorasLatham, Otis Burress C, MD at 06/25/18 1244                Author: Daine FlorasLatham, Eufemia Prindle C, MD  Service: --  Author Type: Physician       Filed: 07/02/18 0111  Encounter Date: 06/25/2018  Status: Signed          Editor: Daine FlorasLatham, Dilyn Smiles C, MD (Physician)          Addended by: Daine FlorasLATHAM, Abubakar Crispo C on: 07/02/2018 01:11 AM    Modules accepted: Orders

## 2018-06-25 NOTE — Progress Notes (Signed)
SUBJECTIVE:   [name] is a 62 y.o. male who is here c/o worsening vision. He notes that 2 days ago, he was driving at night in an unfamiliar area and had marked difficulty seeing his nearby surroundings. He reports that he is able to see during the day. He last saw his optometrist 3 months ago, at which time his glasses prescription was updated. However, he reports that his vision has not improved. He has not seen an ophthalmologist. He believes that this vision changes may be r/t his DM. He reports that he believes that his BG has been elevated recently d/t his diet. He reports that his BG was 391 this morning, which is typical for him recently. This morning, he ate spinach quiche, a small cup of blueberry yogurt, and black coffee. His lowest BG readings recently have been in the 200s. He only takes his BG in the mornings, not in the evenings. He travels frequently and eats fast food often. He typically eats whole grain cereal or eggs for breakfast and drinks a "juice" of ginger/cayenne pepper/tumeric, or occasionally gets a breakfast sandwich from a restaurant. For lunch, he typically eats a sandwich at a restaurant. He typically cooks dinner at home which is fish/chicken or occasionally steak, as well as fresh vegetables. However, he does note that he frequently eats sweets throughout the day, including cookies, ice cream, cheesecake, etc. He does not drink sodas or fruit juices. He endorses polyuria and some polydipsia. He reports polyuria during the night for the past few days, getting up 3-4x per night rather than his usual 0-1x. He denies fatigue. Pt takes metformin 500mg  BID. His last Hgb A1c was 11.4 on 03/08/2018. Prior to this, Hgb A1c was 6.8 on 07/22/2017. He is very resistant to the idea of using insulin and wants to control his DM with metformin and diet. He denies CP or SOB. He quit smoking cigarettes 1 year ago. He does not exercise regularly.       At this time, he is otherwise doing  well and has brought no other complaints to my attention today.      PMH:   Past Medical History:   Diagnosis Date   ??? CAD (coronary artery disease)     heart attack 2011   ??? Chronic obstructive pulmonary disease (HCC)    ??? Hypercholesterolemia    ??? Hypertension      PSH:  has a past surgical history that includes pr cardiac surg procedure unlist and hx other surgical (02/05/2018).    All: has No Known Allergies.   MEDS:   Current Outpatient Medications   Medication Sig   ??? clopidogrel (PLAVIX) 75 mg tab Take 75 mg by mouth daily.   ??? cyclobenzaprine (FLEXERIL) 10 mg tablet TAKE 1 TABLET BY MOUTH 3 TIMES A DAY AS NEEDED FOR MUSCLE SPASMS   ??? metFORMIN (GLUCOPHAGE) 500 mg tablet TAKE 1 TABLET BY MOUTH TWICE A DAY WITH MEALS   ??? rosuvastatin (CRESTOR) 20 mg tablet Take 1 Tab by mouth nightly.   ??? lisinopril (PRINIVIL, ZESTRIL) 10 mg tablet Take 1 Tab by mouth daily.   ??? glucose blood VI test strips (ACCU-CHEK SMARTVIEW TEST STRIP) strip Test blood sugar every morning fasting.   ??? glucose blood VI test strips (ACCU-CHEK SMARTVIEW TEST STRIP) strip by Does Not Apply route See Admin Instructions.   ??? metoprolol succinate (TOPROL-XL) 50 mg XL tablet Take 50 mg by mouth daily.   ??? omega-3 fatty acids-vitamin e 1,000  mg cap Take 1 Cap by mouth daily.   ??? aspirin delayed-release 81 mg tablet Take 81 mg by mouth daily.   ??? multivitamin (ONE A DAY) tablet Take 1 Tab by mouth daily.   ??? glucosamine-chondroitin (ARTHX) 500-400 mg cap Take 1 Cap by mouth daily.   ??? CHANTIX 1 mg tablet TAKE 1 TABLET BY MOUTH TWICE A DAY FOR 60 DAYS     No current facility-administered medications for this visit.        FH: family history includes Diabetes in his brother; Heart Disease in his father; Hypertension in his mother.   SH:  reports that he quit smoking about 16 months ago. He has a 40.00 pack-year smoking history. He has never used smokeless tobacco. He reports that he does not drink alcohol or use drugs.     Review of Systems - History  obtained from the patient  General ROS: negative  Psychological ROS: negative  Ophthalmic ROS: +vision change  ENT ROS: negative  Respiratory ROS: no cough, shortness of breath, or wheezing  Cardiovascular ROS: no chest pain or dyspnea on exertion  Gastrointestinal ROS: no abdominal pain, change in bowel habits, or black or bloody stools  Genito-Urinary ROS: negative  Musculoskeletal ROS: negative  Neurological ROS: negative  Dermatological ROS: negative  Endocrine: +polydipsia, polyuria    OBJECTIVE:   Vitals:   Visit Vitals  BP 107/74 (BP 1 Location: Left arm, BP Patient Position: Sitting)   Pulse 82   Temp 98.4 ??F (36.9 ??C) (Oral)   Resp 18   Ht 5\' 10"  (1.778 m)   Wt 242 lb (109.8 kg)   SpO2 94%   BMI 34.72 kg/m??      Gen: Pleasant 62 y.o.  male in NAD.    HEENT: NC/AT.    HEART: RRR, No M/G/R.   LUNGS: CTAB No W/R.   EXTREMITIES: Warm. No C/C/E.   NEURO: Alert and oriented x 3.  Cranial nerves grossly intact.  No focal sensory or motor deficits noted.   SKIN: Warm. Dry. No rashes or other lesions noted.    ASSESSMENT/ PLAN:     Diagnoses and all orders for this visit:    1. Uncontrolled type 2 diabetes mellitus with hyperglycemia (HCC)  -     AMB POC GLUCOSE, QUANTITATIVE, BLOOD  -     AMB POC HEMOGLOBIN A1C  -     METABOLIC PANEL, COMPREHENSIVE  -     CBC WITH AUTOMATED DIFF  -     MICROALBUMIN, UR, RAND W/ MICROALB/CREAT RATIO    2. Back muscle spasm  -     cyclobenzaprine (FLEXERIL) 10 mg tablet; TAKE 1 TABLET BY MOUTH 3 TIMES A DAY AS NEEDED FOR MUSCLE SPASMS    3. Essential hypertension  -     AMB POC HEMOGLOBIN A1C    4. Blurry vision, bilateral  -     REFERRAL TO OPHTHALMOLOGY       1. Uncontrolled DM 2 with hyperglycemia  Ordered CMP, microalbumin, CBC. POC BG was "high" (glucometer unable to show value) and Hgb A1c was 11.9. Discussed possibility of medication changes, including insulin, with the pt pending these values. I recommended insulin, but he does not want to take insulin Discussed necessary  diet and exercise changes. Advised him to check BG TID for the next week and record values to send to [place]. Increased metformin to 1000mg  in the mornings.     2. Back muscle spasm  Refilled cyclobenzaprine.  3. Essential HTN  Ordered Hgb A1c.    4. Blurry vision  Provided referral to Dr. Leisa LenzShilpi Padhan (ophthalmology).     Follow-up and Dispositions    ?? Return in about 3 months (around 09/25/2018) for diabetes.         I have reviewed the patient's medications and risks/side effects/benefits were discussed. Diagnosis(-es) explained to patient and questions answered. Literature provided where appropriate.     Written by Phill MutterAnn Lewis, [place], as dictated by Jackolyn Conferuth Aaleigha Bozza, MD.

## 2018-06-25 NOTE — Progress Notes (Signed)
SUBJECTIVE:   Anthony [name] is a 62 y.o. male who is here c/o worsening vision. He notes that 2 days ago, he was driving at night in an unfamiliar area and had marked difficulty seeing his nearby surroundings. He reports that he is able to see during the day. He last saw his optometrist 3 months ago, at which time his glasses prescription was updated. However, he reports that his vision has not improved. He has not seen an ophthalmologist. He believes that this vision changes may be r/t his DM. He reports that he believes that his BG has been elevated recently d/t his diet. He reports that his BG was 391 this morning, which is typical for him recently. This morning, he ate spinach quiche, a small cup of blueberry yogurt, and black coffee. His lowest BG readings recently have been in the 200s. He only takes his BG in the mornings, not in the evenings. He travels frequently and eats fast food often. He typically eats whole grain cereal or eggs for breakfast and drinks a "juice" of ginger/cayenne pepper/tumeric, or occasionally gets a breakfast sandwich from a restaurant. For lunch, he typically eats a sandwich at a restaurant. He typically cooks dinner at home which is fish/chicken or occasionally steak, as well as fresh vegetables. However, he does note that he frequently eats sweets throughout the day, including cookies, ice cream, cheesecake, etc. He does not drink sodas or fruit juices. He endorses polyuria and some polydipsia. He reports polyuria during the night for the past few days, getting up 3-4x per night rather than his usual 0-1x. He denies fatigue. Pt takes metformin 500mg  BID. His last Hgb A1c was 11.4 on 03/08/2018. Prior to this, Hgb A1c was 6.8 on 07/22/2017. He is very resistant to the idea of using insulin and wants to control his DM with metformin and diet. He denies CP or SOB. He quit smoking cigarettes 1 year ago. He does not exercise regularly.        At this time, he is otherwise doing well and has brought no other complaints to my attention today.      PMH:   Past Medical History:   Diagnosis Date   ??? CAD (coronary artery disease)     heart attack 2011   ??? Chronic obstructive pulmonary disease (HCC)    ??? Hypercholesterolemia    ??? Hypertension      PSH:  has a past surgical history that includes pr cardiac surg procedure unlist and hx other surgical (02/05/2018).    All: has No Known Allergies.   MEDS:   Current Outpatient Medications   Medication Sig   ??? clopidogrel (PLAVIX) 75 mg tab Take 75 mg by mouth daily.   ??? cyclobenzaprine (FLEXERIL) 10 mg tablet TAKE 1 TABLET BY MOUTH 3 TIMES A DAY AS NEEDED FOR MUSCLE SPASMS   ??? metFORMIN (GLUCOPHAGE) 500 mg tablet TAKE 1 TABLET BY MOUTH TWICE A DAY WITH MEALS   ??? rosuvastatin (CRESTOR) 20 mg tablet Take 1 Tab by mouth nightly.   ??? lisinopril (PRINIVIL, ZESTRIL) 10 mg tablet Take 1 Tab by mouth daily.   ??? glucose blood VI test strips (ACCU-CHEK SMARTVIEW TEST STRIP) strip Test blood sugar every morning fasting.   ??? glucose blood VI test strips (ACCU-CHEK SMARTVIEW TEST STRIP) strip by Does Not Apply route See Admin Instructions.   ??? metoprolol succinate (TOPROL-XL) 50 mg XL tablet Take 50 mg by mouth daily.   ??? omega-3 fatty acids-vitamin e 1,000  mg cap Take 1 Cap by mouth daily.   ??? aspirin delayed-release 81 mg tablet Take 81 mg by mouth daily.   ??? multivitamin (ONE A DAY) tablet Take 1 Tab by mouth daily.   ??? glucosamine-chondroitin (ARTHX) 500-400 mg cap Take 1 Cap by mouth daily.   ??? CHANTIX 1 mg tablet TAKE 1 TABLET BY MOUTH TWICE A DAY FOR 60 DAYS     No current facility-administered medications for this visit.        FH: family history includes Diabetes in his brother; Heart Disease in his father; Hypertension in his mother.   SH:  reports that he quit smoking about 16 months ago. He has a 40.00 pack-year smoking history. He has never used smokeless tobacco. He reports  that he does not drink alcohol or use drugs.     Review of Systems - History obtained from the patient  General ROS: negative  Psychological ROS: negative  Ophthalmic ROS: +vision change  ENT ROS: negative  Respiratory ROS: no cough, shortness of breath, or wheezing  Cardiovascular ROS: no chest pain or dyspnea on exertion  Gastrointestinal ROS: no abdominal pain, change in bowel habits, or black or bloody stools  Genito-Urinary ROS: negative  Musculoskeletal ROS: negative  Neurological ROS: negative  Dermatological ROS: negative  Endocrine: +polydipsia, polyuria    OBJECTIVE:   Vitals:   Visit Vitals  BP 107/74 (BP 1 Location: Left arm, BP Patient Position: Sitting)   Pulse 82   Temp 98.4 ??F (36.9 ??C) (Oral)   Resp 18   Ht 5\' 10"  (1.778 m)   Wt 242 lb (109.8 kg)   SpO2 94%   BMI 34.72 kg/m??      Gen: Pleasant 62 y.o.  male in NAD.    HEENT: NC/AT.    HEART: RRR, No M/G/R.   LUNGS: CTAB No W/R.   EXTREMITIES: Warm. No C/C/E.   NEURO: Alert and oriented x 3.  Cranial nerves grossly intact.  No focal sensory or motor deficits noted.   SKIN: Warm. Dry. No rashes or other lesions noted.    ASSESSMENT/ PLAN:     Diagnoses and all orders for this visit:    1. Uncontrolled type 2 diabetes mellitus with hyperglycemia (HCC)  -     AMB POC GLUCOSE, QUANTITATIVE, BLOOD  -     AMB POC HEMOGLOBIN A1C  -     METABOLIC PANEL, COMPREHENSIVE  -     CBC WITH AUTOMATED DIFF  -     MICROALBUMIN, UR, RAND W/ MICROALB/CREAT RATIO    2. Back muscle spasm  -     cyclobenzaprine (FLEXERIL) 10 mg tablet; TAKE 1 TABLET BY MOUTH 3 TIMES A DAY AS NEEDED FOR MUSCLE SPASMS    3. Essential hypertension  -     AMB POC HEMOGLOBIN A1C    4. Blurry vision, bilateral  -     REFERRAL TO OPHTHALMOLOGY       1. Uncontrolled DM 2 with hyperglycemia  Ordered CMP, microalbumin, CBC. POC BG was "high" (glucometer unable to show value) and Hgb A1c was 11.9. Discussed possibility of medication changes, including insulin, with the pt pending these values. I  recommended insulin, but he does not want to take insulin Discussed necessary diet and exercise changes. Advised him to check BG TID for the next week and record values to send to [place]. Increased metformin to 1000mg  in the mornings.     2. Back muscle spasm  Refilled cyclobenzaprine.  3. Essential HTN  Ordered Hgb A1c.    4. Blurry vision  Provided referral to Dr. Leisa LenzShilpi Padhan (ophthalmology).     Follow-up and Dispositions    ?? Return in about 3 months (around 09/25/2018) for diabetes.         I have reviewed the patient's medications and risks/side effects/benefits were discussed. Diagnosis(-es) explained to patient and questions answered. Literature provided where appropriate.     Written by Phill MutterAnn Lewis, [place], as dictated by Jackolyn Conferuth Syan Cullimore, MD.

## 2018-06-25 NOTE — Patient Instructions (Signed)
Goals/plan:  -start checking blood sugars fasting every morning   -increase dose of metformin as instructed by Dr. Emilee HeroLatham today; Graciella Beltonianne will check with Dr. Emilee HeroLatham on extended release formulation  -[facility]  -start following the healthy plate meal plan again, being mindful of what is a carb (fruit, dairy, grains, starchy vegetables) and portion size; guideline is up to 45-60 grams of carb per meal (3 meals per day) or 3-4 servings per meal; snack guideline - 1 oz. protein serving and may add 1 carb serving  -eliminate sweets  -call Tennyson Kallen with any questions at [phone]  -follow up with Dr. Emilee HeroLatham in three months

## 2018-06-25 NOTE — Progress Notes (Addendum)
Pt was seen by Dr. Emilee HeroLatham today for follow up. Was asked by Dr. Emilee HeroLatham and her team to see patient for uncontrolled diabetes with an amb POC today of 11.9%. Pt was seen last year and A1c came down to 6.8%. Pt stated he has to cut out the sweets. He does not want to start insulin. Last A1c was today 11.9%. Previous A1Cs were:    Component      Latest Ref Rng & Units 03/08/2018 07/22/2017 04/17/2017 01/14/2017          10:28 AM 10:20 AM 12:20 PM  8:54 AM   Hemoglobin A1c, (calculated)      4.8 - 5.6 %  6.8 (H)  7.0 (H)   Estimated average glucose      mg/dL  161148  [id number]   Hemoglobin A1c (POC)      4.8 - 5.6 % 11.4 (A)  7.8      Component      Latest Ref Rng & Units 12/26/2015          10:57 AM   Hemoglobin A1c, (calculated)      4.8 - 5.6 % 6.5 (H)   Estimated average glucose      mg/dL 045140   Hemoglobin W0JA1c (POC)      4.8 - 5.6 %      Previous diabetes education - this CDE .     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; lives with his wife    Diabetes History/Diabetes Family History - mom and brother who is an MD    Symptoms of high blood sugar- blurry vision, increased thirst    Motivation - not start insulin    AADE 7 Self-Care Behaviors-    1) Healthy Eating/Food Recall- has been eating a lot of sweets; drinks a lot of black coffee during the day; has the My Fitness Pal app on his phone  BK - 6 AM- 1 cup grain cereal like cracklin oat bran, 1/2 cup 2% milk; or 2 eggs with 22 grain bread-2 slices, black coffee  -pt stated will check the label on his cereals and grain bread  LN - 11:00-[employer] salad with vinaigrette; or sandwich, unsweet tea  -pt stated will check the nutritional value for carbs with [employer] food choices  DN - 8:00 PM- chicken of fish; last night had 2 italian sausages, one hot dog bun, ice cream bar  SN - wife made a cheesecake so has been eating it all week; 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 1/4 cup lemon juice    2) Being Active- quit going to the gym; has the My Fitness Pal app on his phone    -pt stated tomorrow or maybe yet today he will [facility] where he has a membership    3) Self Monitoring Blood Glucose (SMBG)- does not check on a regular basis; most numbers are in the 300s; blood glucose flow sheet was ordered in My Chart    How to treat a low blood sugar -  n/a    4) Taking Medication- pt takes metformin IR 500 mg with breakfast and dinner; per pt, Dr. Emilee HeroLatham increased morning dose  to 1000 mg. Pt is willing to maximize his metformin and add another oral medication if his numbers do not come down with lifestyle modification    5) Problem Solving- pt is ready and willing to make adjustments in his treatment plan to lower his blood sugars    6) Reducing Risk-   Tobacco - quit over a year ago  HTN - takes lisinopril, metoprolol  HLD - takes Crestor  Aspirin - takes 81 mg, Plavix    Discussed possible complications of uncontrolled diabetes ie fatigue, dehydration, damage to vital organs.    7) Healthy Coping-   Support system - Pt's wife is supportive in helping pt attain and maintain their diabetes health. Pt will take advantage of the support and education provided today and of the support of his health care team.    Barriers identified - none    Health Maintenance Due:  Health Maintenance Due   Topic Date Due   ??? FOOT EXAM Q1  02/07/1968   ??? EYE EXAM RETINAL OR DILATED  02/07/1968   ??? DTaP/Tdap/Td series (1 - Tdap) 02/07/1979   ??? Shingrix Vaccine Age 14> (1 of 2) 02/07/2008   ??? FOBT Q 1 YEAR AGE 65-75  02/07/2008   ??? MICROALBUMIN Q1  01/14/2018     Resources provided:    -Living With Type 2 Diabetes Create Your Plate section    -Label Reading Basics for Diabetes     -Low and No-Carb Snack Ideas for Diabetics    DSMT- interested in the afternoon class on a Tuesday or Thursday    Plan / Pt Goals -   -start checking blood sugars fasting every morning; send results to Dr. Emilee Hero in two weeks  -increase dose of metformin as instructed by Dr. Emilee Hero today; will check with Dr. Emilee Hero on maximizing dose to 2000 mg daily and extended release formulation  -go back to the [facility] three times a week  -start following the healthy plate meal plan again, being mindful of what is a carb (fruit, dairy, grains, starchy vegetables) and portion size; guideline is up to 45-60 grams of carb per meal (3 meals per day) or 3-4 servings per meal; snack guideline - 1 oz. protein serving and may add 1 carb serving  -eliminate sweets  -call Julez Huseby with any questions at [phone]  -follow up with Dr. Emilee Hero in three months    Future Appointments   Date Time Provider Department Center   09/22/2018 10:00 AM Daine Floras, MD [facility] SCHED      Last Appointment My Department:  06/25/2018    Chart was routed to Dr. Emilee Hero.    Tawny Asal, RN, BSN, CDE, CCM  (Phone) [phone]

## 2018-06-25 NOTE — Progress Notes (Signed)
Message was left for patient to contact office with glucose reading. Lab results mailed to patient.

## 2018-06-25 NOTE — Progress Notes (Signed)
Please call the patient to get his current blood glucose readings since increasing his medication and following a diabetic diet.  CMP-Normal electrolyte levels except for an elevation in the glucose level, normal renal and liver function  Microalbumin- elevated

## 2018-06-25 NOTE — Progress Notes (Signed)
Identified pt with two pt identifiers(name and DOB). Reviewed record in preparation for visit and have obtained necessary documentation.  Chief Complaint   Patient presents with   ??? Diabetes     3 month f/u    ??? Eye Problem     Pt reports not being able to see with glasses on.       Visit Vitals  BP 107/74 (BP 1 Location: Left arm, BP Patient Position: Sitting)   Pulse 82   Temp 98.4 ??F (36.9 ??C) (Oral)   Resp 18   Ht 5' 10" (1.778 m)   Wt 242 lb (109.8 kg)   SpO2 94%   BMI 34.72 kg/m??     Health Maintenance Due   Topic   ??? FOOT EXAM Q1    ??? EYE EXAM RETINAL OR DILATED    ??? DTaP/Tdap/Td series (1 - Tdap)   ??? Shingrix Vaccine Age 60> (1 of 2)   ??? FOBT Q 1 YEAR AGE 60-75    ??? MICROALBUMIN Q1        Coordination of Care Questionnaire:  :   1) Have you been to an emergency room, urgent care, or hospitalized since your last visit? If yes, where when, and reason for visit?  No         2. Have seen or consulted any other health care provider since your last visit?   If yes, where when, and reason for visit? No       3) Do you have an Advanced Directive/ Living Will in place? No  If yes, do we have a copy on file   If no, would you like information     Patient is accompanied by self I have received verbal consent from Anthony [name] to discuss any/all medical information while they are present in the room.

## 2018-06-25 NOTE — Addendum Note (Signed)
Addended by: Daine FlorasLATHAM, Merik Mignano C on: 07/02/2018 01:11 AM     Modules accepted: Orders

## 2018-06-25 NOTE — Progress Notes (Signed)
Message was left for patient to contact office with glucose reading. Lab results mailed to patient.

## 2018-06-25 NOTE — Progress Notes (Signed)
Please call the patient to get his current blood glucose readings since increasing his medication and following a diabetic diet.  CMP-Normal electrolyte levels except for an elevation in the glucose level, normal renal and liver function  Microalbumin- elevated

## 2018-06-25 NOTE — Progress Notes (Signed)
Pt was seen by Dr. Emilee Hero today for follow up. Was asked by Dr. Emilee Hero and her team to see patient for uncontrolled diabetes with an amb POC today of 11.9%. Pt was seen last year and A1c came down to 6.8%. Pt stated he has to cut out the sweets. He does not want to start insulin. Last A1c was today 11.9%. Previous A1Cs were:    Component      Latest Ref Rng & Units 03/08/2018 07/22/2017 04/17/2017 01/14/2017          10:28 AM 10:20 AM 12:20 PM  8:54 AM   Hemoglobin A1c, (calculated)      4.8 - 5.6 %  6.8 (H)  7.0 (H)   Estimated average glucose      mg/dL  960  454   Hemoglobin A1c (POC)      4.8 - 5.6 % 11.4 (A)  7.8      Component      Latest Ref Rng & Units 12/26/2015          10:57 AM   Hemoglobin A1c, (calculated)      4.8 - 5.6 % 6.5 (H)   Estimated average glucose      mg/dL 098   Hemoglobin J1B (POC)      4.8 - 5.6 %      Previous diabetes education - this CDE .     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; lives with his wife    Diabetes History/Diabetes Family History - mom and brother who is an MD    Symptoms of high blood sugar- blurry vision, increased thirst    Motivation - not start insulin    AADE 7 Self-Care Behaviors-    1) Healthy Eating/Food Recall- has been eating a lot of sweets; drinks a lot of black coffee during the day; has the My Fitness Pal app on his phone  BK - 6 AM- 1 cup grain cereal like cracklin oat bran, 1/2 cup 2% milk; or 2 eggs with 22 grain bread-2 slices, black coffee  -pt stated will check the label on his cereals and grain bread  LN - 11:00-[employer] salad with vinaigrette; or sandwich, unsweet tea  -pt stated will check the nutritional value for carbs with [employer] food choices  DN - 8:00 PM- chicken of fish; last night had 2 italian sausages, one hot dog bun, ice cream bar  SN - wife made a cheesecake so has been eating it all week; keeps mixed nuts in his truck and will eat  a handfu; cookies, donuts, ice cream  BEV - coffee, water, 12 oz of homemade juice for inflammation -ginger root, tumeric, 1 tsp honey, cayenne pepper. 1/4 cup lemon juice    2) Being Active- quit going to the gym; has the My Fitness Pal app on his phone    -pt stated tomorrow or maybe yet today he will go back to the [facility] three days a week where he has a membership    3) Self Monitoring Blood Glucose (SMBG)- does not check on a regular basis; most numbers are in the 300s; blood glucose flow sheet was ordered in My Chart    How to treat a low blood sugar -  n/a    4) Taking Medication- pt takes metformin IR 500 mg with breakfast and dinner; per pt, Dr. Emilee Hero increased morning dose  to 1000 mg. Pt is willing to maximize his metformin and add another oral medication if his numbers do not come down with lifestyle modification    5) Problem Solving- pt is ready and willing to make adjustments in his treatment plan to lower his blood sugars    6) Reducing Risk-   Tobacco - quit over a year ago  HTN - takes lisinopril, metoprolol  HLD - takes Crestor  Aspirin - takes 81 mg, Plavix    Discussed possible complications of uncontrolled diabetes ie fatigue, dehydration, damage to vital organs.    7) Healthy Coping-   Support system - Pt's wife is supportive in helping pt attain and maintain their diabetes health. Pt will take advantage of the support and education provided today and of the support of his health care team.    Barriers identified - none    Health Maintenance Due:  Health Maintenance Due   Topic Date Due   . FOOT EXAM Q1  02/07/1968   . EYE EXAM RETINAL OR DILATED  02/07/1968   . DTaP/Tdap/Td series (1 - Tdap) 02/07/1979   . Shingrix Vaccine Age 46> (1 of 2) 02/07/2008   . FOBT Q 1 YEAR AGE 1-75  02/07/2008   . MICROALBUMIN Q1  01/14/2018     Resources provided:    -Living With Type 2 Diabetes Create Your Plate section    -Label Reading Basics for Diabetes    -Low and No-Carb Snack Ideas for Diabetics    DSMT-  interested in the afternoon class on a Tuesday or Thursday    Plan / Pt Goals -   -start checking blood sugars fasting every morning; send results to Dr. Emilee Hero in two weeks  -increase dose of metformin as instructed by Dr. Emilee Hero today; will check with Dr. Emilee Hero on maximizing dose to 2000 mg daily and extended release formulation  -go back to the [facility] three times a week  -start following the healthy plate meal plan again, being mindful of what is a carb (fruit, dairy, grains, starchy vegetables) and portion size; guideline is up to 45-60 grams of carb per meal (3 meals per day) or 3-4 servings per meal; snack guideline - 1 oz. protein serving and may add 1 carb serving  -eliminate sweets  -call Dianne with any questions at [phone]  -follow up with Dr. Emilee Hero in three months    Future Appointments   Date Time Provider Department Center   09/22/2018 10:00 AM Daine Floras, MD [facility] SCHED      Last Appointment My Department:  06/25/2018    Chart was routed to Dr. Emilee Hero.    Tawny Asal, RN, BSN, CDE, CCM  (Phone) [phone]

## 2018-06-26 LAB — COMPREHENSIVE METABOLIC PANEL
ALT: 26 IU/L (ref 0–44)
AST: 17 IU/L (ref 0–40)
Albumin/Globulin Ratio: 2 NA (ref 1.2–2.2)
Albumin: 5.1 g/dL — ABNORMAL HIGH (ref 3.6–4.8)
Alkaline Phosphatase: 59 IU/L (ref 39–117)
BUN: 20 mg/dL (ref 8–27)
Bun/Cre Ratio: 22 NA (ref 10–24)
CO2: 21 mmol/L (ref 20–29)
Calcium: 10.3 mg/dL — ABNORMAL HIGH (ref 8.6–10.2)
Chloride: 94 mmol/L — ABNORMAL LOW (ref 96–106)
Creatinine: 0.93 mg/dL (ref 0.76–1.27)
EGFR IF NonAfrican American: 89 mL/min/{1.73_m2} (ref >59)
GFR African American: 103 mL/min/{1.73_m2} (ref >59)
Globulin, Total: 2.5 g/dL (ref 1.5–4.5)
Glucose: 422 mg/dL — ABNORMAL HIGH (ref 65–99)
Potassium: 4.8 mmol/L (ref 3.5–5.2)
Sodium: 132 mmol/L — ABNORMAL LOW (ref 134–144)
Total Bilirubin: 0.3 mg/dL (ref 0.0–1.2)
Total Protein: 7.6 g/dL (ref 6.0–8.5)

## 2018-06-26 LAB — CBC WITH AUTO DIFFERENTIAL
Basophils %: 1 %
Basophils Absolute: 0.1 10*3/uL (ref 0.0–0.2)
Eosinophils %: 2 %
Eosinophils Absolute: 0.1 10*3/uL (ref 0.0–0.4)
Granulocyte Absolute Count: 0 10*3/uL (ref 0.0–0.1)
Hematocrit: 44.4 % (ref 37.5–51.0)
Hemoglobin: 14.9 g/dL (ref 13.0–17.7)
Immature Granulocytes: 0 %
Lymphocytes %: 35 %
Lymphocytes Absolute: 2.8 10*3/uL (ref 0.7–3.1)
MCH: 30.8 pg (ref 26.6–33.0)
MCHC: 33.6 g/dL (ref 31.5–35.7)
MCV: 92 fL (ref 79–97)
Monocytes %: 8 %
Monocytes Absolute: 0.6 10*3/uL (ref 0.1–0.9)
Neutrophils %: 54 %
Neutrophils Absolute: 4.2 10*3/uL (ref 1.4–7.0)
Platelets: 278 10*3/uL (ref 150–450)
RBC: 4.83 x10E6/uL (ref 4.14–5.80)
RDW: 13 % (ref 12.3–15.4)
WBC: 7.8 10*3/uL (ref 3.4–10.8)

## 2018-06-26 LAB — MICROALBUMIN / CREATININE URINE RATIO
Creatinine, Ur: 77.5 mg/dL
Microalb, Ur: 33.2 ug/mL
Microalbumin Creatinine Ratio: 42.8 mg/g creat — ABNORMAL HIGH (ref 0.0–30.0)

## 2018-06-26 LAB — CBC WITH AUTOMATED DIFF
ABS. BASOPHILS: 0.1 10*3/uL (ref 0.0–0.2)
ABS. EOSINOPHILS: 0.1 10*3/uL (ref 0.0–0.4)
ABS. IMM. GRANS.: 0 10*3/uL (ref 0.0–0.1)
ABS. MONOCYTES: 0.6 10*3/uL (ref 0.1–0.9)
ABS. NEUTROPHILS: 4.2 10*3/uL (ref 1.4–7.0)
Abs Lymphocytes: 2.8 10*3/uL (ref 0.7–3.1)
BASOPHILS: 1 %
EOSINOPHILS: 2 %
HCT: 44.4 % (ref 37.5–51.0)
HGB: 14.9 g/dL (ref 13.0–17.7)
IMMATURE GRANULOCYTES: 0 %
Lymphocytes: 35 %
MCH: 30.8 pg (ref 26.6–33.0)
MCHC: 33.6 g/dL (ref 31.5–35.7)
MCV: 92 fL (ref 79–97)
MONOCYTES: 8 %
NEUTROPHILS: 54 %
PLATELET: 278 10*3/uL (ref 150–450)
RBC: 4.83 x10E6/uL (ref 4.14–5.80)
RDW: 13 % (ref 12.3–15.4)
WBC: 7.8 10*3/uL (ref 3.4–10.8)

## 2018-06-26 LAB — METABOLIC PANEL, COMPREHENSIVE
A-G Ratio: 2 (ref 1.2–2.2)
ALT (SGPT): 26 IU/L (ref 0–44)
AST (SGOT): 17 IU/L (ref 0–40)
Albumin: 5.1 g/dL — ABNORMAL HIGH (ref 3.6–4.8)
Alk. phosphatase: 59 IU/L (ref 39–117)
BUN/Creatinine ratio: 22 (ref 10–24)
BUN: 20 mg/dL (ref 8–27)
Bilirubin, total: 0.3 mg/dL (ref 0.0–1.2)
CO2: 21 mmol/L (ref 20–29)
Calcium: 10.3 mg/dL — ABNORMAL HIGH (ref 8.6–10.2)
Chloride: 94 mmol/L — ABNORMAL LOW (ref 96–106)
Creatinine: 0.93 mg/dL (ref 0.76–1.27)
GFR est AA: 103 mL/min/{1.73_m2} (ref >59)
GFR est non-AA: 89 mL/min/{1.73_m2} (ref >59)
GLOBULIN, TOTAL: 2.5 g/dL (ref 1.5–4.5)
Glucose: 422 mg/dL — ABNORMAL HIGH (ref 65–99)
Potassium: 4.8 mmol/L (ref 3.5–5.2)
Protein, total: 7.6 g/dL (ref 6.0–8.5)
Sodium: 132 mmol/L — ABNORMAL LOW (ref 134–144)

## 2018-06-26 LAB — MICROALBUMIN, UR, RAND W/ MICROALB/CREAT RATIO
Creatinine, urine random: 77.5 mg/dL
Microalb/Creat ratio (ug/mg creat.): 42.8 mg/g creat — ABNORMAL HIGH (ref 0.0–30.0)
Microalbumin, urine: 33.2 ug/mL

## 2018-07-05 NOTE — Telephone Encounter (Signed)
Call returned to patient, mail box full unable to leave message.

## 2018-07-05 NOTE — Telephone Encounter (Signed)
#  [phone] pt returning your call

## 2018-07-06 NOTE — Telephone Encounter (Signed)
Patient states he's returning your call. Patient was advised voice mail full. Patient states a detailed message can be left on voice mail. Thank you

## 2018-07-06 NOTE — Telephone Encounter (Signed)
Called, spoke with Pt.   Two pt identifiers confirmed.  Checking on how his blood sugar is running since his office visit on 06/25/18 per Dr. Emilee HeroLatham.   Pt stated his blood sugars are a running a constant of 230 at the lowest and 265 at the highest.   Advised Pt will speak w/ Dr. Emilee HeroLatham about recommendations as his blood sugars are still too high.   Pt verbalized understanding of information discussed w/ no further questions at this time.

## 2018-07-29 MED ORDER — GLIPIZIDE 5 MG TAB
5 mg | ORAL_TABLET | Freq: Two times a day (BID) | ORAL | 2 refills | Status: DC
Start: 2018-07-29 — End: 2018-10-04

## 2018-07-29 NOTE — Telephone Encounter (Addendum)
Has there been any change in his glucose levels with following a diabetic diet and increasing the Metformin? If he is still having glucose levels in the 200's he can start the glipizide.  I sent a prescription for glipizide to his pharmacy. He can start with 1/2 tab bid.

## 2018-07-29 NOTE — Addendum Note (Signed)
Addended by: Daine FlorasLATHAM, Idrissa Beville C on: 07/29/2018 02:10 AM     Modules accepted: Orders

## 2018-07-29 NOTE — Telephone Encounter (Signed)
Called, spoke with Pt.   Two pt identifiers confirmed.  Asked Pt how his blood sugars have been running.   Pt stated this morning it was 130 and highest at the moment is between 150-200 depending on what he eats.   Advised Pt Dr. Emilee HeroLatham, sent in glipizide for him to start.   Pt informed he can take 1/2 tablet twice daily and that will help with his numbers as well.   Pt verbalized understanding of information discussed w/ no further questions at this time.

## 2018-07-29 NOTE — Addendum Note (Signed)
Addendum  Note by Daine FlorasLatham, Leny Morozov C, MD at 07/29/18 0210                Author: Daine FlorasLatham, Danzell Birky C, MD  Service: --  Author Type: Physician       Filed: 07/29/18 0210  Encounter Date: 07/06/2018  Status: Signed          Editor: Daine FlorasLatham, Ratasha Fabre C, MD (Physician)          Addended by: Daine FlorasLATHAM, Knox Holdman C on: 07/29/2018 02:10 AM    Modules accepted: Orders

## 2018-08-04 NOTE — Progress Notes (Signed)
Chart reviewed. Reached out to Anthony [name] regarding follow up of uncontrolled diabetes. Anthony [name] was started on glipzide last week by Dr. Emilee HeroLatham.     08/04/18-dkw  -Anthony [name] stated doing well  -picked up glipizide yesterday and has not started taking it yet; is taking metformin   -blood sugars are mid 120s to upper 130s  -doing well with healthy plate meal planning  -[facility]  -feeling better and can see better  -will follow up with Dr. Emilee HeroLatham in October as scheduled  -in the meantime will call Dianne if needs anything  -will follow      Future Appointments   Date Time Provider Department Center   09/22/2018 10:00 AM Daine [facility]Latham, [facility]      Last Appointment My Department:  06/25/2018    Key Antihyperglycemic Medications             glipiZIDE (GLUCOTROL) 5 mg tablet Take 1 Tab by mouth two (2) times a day.    metFORMIN (GLUCOPHAGE) 500 mg tablet TAKE 1 TABLET BY MOUTH TWICE A DAY WITH MEALS

## 2018-08-04 NOTE — Progress Notes (Signed)
Chart reviewed. Reached out to pt regarding follow up of uncontrolled diabetes. Pt was started on glipzide last week by Dr. Latham.     08/04/18-dkw  -pt stated doing well  -picked up glipizide yesterday and has not started taking it yet; is taking metformin   -blood sugars are mid 120s to upper 130s  -doing well with healthy plate meal planning  -is going to the [facility]  -feeling better and can see better  -will follow up with Dr. Latham in October as scheduled  -in the meantime will call Anthony [name] if needs anything  -will follow      Future Appointments   Date Time Provider Department Center   09/22/2018 10:00 AM [name], Anthony C, MD [facility] SCHED      Last Appointment My Department:  06/25/2018    Key Antihyperglycemic Medications             glipiZIDE (GLUCOTROL) 5 mg tablet Take 1 Tab by mouth two (2) times a day.    metFORMIN (GLUCOPHAGE) 500 mg tablet TAKE 1 TABLET BY MOUTH TWICE A DAY WITH MEALS

## 2018-08-27 ENCOUNTER — Encounter

## 2018-08-27 ENCOUNTER — Other Ambulatory Visit: Payer: Self-pay | Admitting: Cardiology

## 2018-08-30 MED ORDER — METFORMIN SR 500 MG 24 HR TABLET
500 mg | ORAL_TABLET | ORAL | 1 refills | Status: DC
Start: 2018-08-30 — End: 2019-02-23

## 2018-08-31 ENCOUNTER — Other Ambulatory Visit: Payer: Self-pay | Admitting: *Deleted

## 2018-08-31 DIAGNOSIS — I6523 Occlusion and stenosis of bilateral carotid arteries: Secondary | ICD-10-CM

## 2018-09-17 DIAGNOSIS — Z23 Encounter for immunization: Secondary | ICD-10-CM | POA: Diagnosis not present

## 2018-09-22 ENCOUNTER — Ambulatory Visit
Admit: 2018-09-22 | Discharge: 2018-09-22 | Payer: PRIVATE HEALTH INSURANCE | Attending: Family Medicine | Primary: Family Medicine

## 2018-09-22 ENCOUNTER — Ambulatory Visit: Attending: Family Medicine | Primary: Family Medicine

## 2018-09-22 DIAGNOSIS — E119 Type 2 diabetes mellitus without complications: Secondary | ICD-10-CM

## 2018-09-22 NOTE — Progress Notes (Signed)
A1c-Your current hgbA1c of 6.5 is lower than your last level of 11.9.  Keep up the good work!!  Continue to work on following a diabetic diet and exercise.  Recheck this test: hgbA1c  in  3 months.  Continue with current  Medications.  CMP-Normal electrolyte levels, renal, and liver function.

## 2018-09-22 NOTE — Progress Notes (Signed)
A1c-Your current hgbA1c of 6.5 is lower than your last level of 11.9.  Keep up the good work!!  Continue to work on following a diabetic diet and exercise.  Recheck this test: hgbA1c  in  3 months.  Continue with current  Medications.  CMP-Normal electrolyte levels, renal, and liver function.

## 2018-09-22 NOTE — Progress Notes (Signed)
Identified pt with two pt identifiers(name and DOB). Reviewed record in preparation for visit and have obtained necessary documentation.    Chief Complaint   Patient presents with   ??? Diabetes     3 month f/u         Visit Vitals  BP 100/65 (BP 1 Location: Left arm, BP Patient Position: Sitting)   Pulse (!) 58   Temp 98 ??F (36.7 ??C) (Oral)   Resp 16   Ht 5' 10" (1.778 m)   Wt 229 lb (103.9 kg)   SpO2 97%   BMI 32.86 kg/m??       Health Maintenance Due   Topic   ??? FOOT EXAM Q1    ??? EYE EXAM RETINAL OR DILATED    ??? DTaP/Tdap/Td series (1 - Tdap)   ??? Shingrix Vaccine Age 60> (1 of 2)   ??? FOBT Q 1 YEAR AGE 60-75    ??? Influenza Age 9 to Adult    ??? HEMOGLOBIN A1C Q6M        Med Reconciliation: Completed    Coordination of Care Questionnaire:  :   1) Have you been to an emergency room, urgent care, or hospitalized since your last visit? If yes, where when, and reason for visit?  No       2. Have seen or consulted any other health care provider since your last visit?   If yes, where when, and reason for visit? No       3) Do you have an Advanced Directive/ Living Will in place? No  If yes, do we have a copy on file   If no, would you like information     Patient is accompanied by self I have received verbal consent from Anthony [name] to discuss any/all medical information while they are present in the room.

## 2018-09-22 NOTE — Progress Notes (Signed)
SUBJECTIVE:   [name] is a 62 y.o. male who is here for follow up of routine medical issues.      DM: Pt is compliant in taking metformin and glipizide. He has cut down to one 5mg  tablet of glipizide instead of two, and two 500mg  tablets of metformin every day. Patient denies numbness/tingling in extremities, fatigue, dizziness, polydipsia, polyuria, polyphagia, pancreatitis, CP, SOB, problems urinating, changes in bowel habits, increased sugar consumption, sore/bleeding gums, blurred vision, or cuts that will not heal. He reports that his vision has improved. Last HgA1c was 6.8% on 07/22/2017. Pt reports that he changed his eating habits. Pt notes that his BG is between 105 and 125 ever morning. He states that he has been going to the gym to exercise regularly.    Pt c/o a knot on his R flank that is more evident when he leans to the left side and has been present for approx 1 yr. Pt denies associated pain.     PREVENTIVE:  Colonoscopy: UTD, 5 yr f/u (12/24/2016)  Flu: Administered Today.    At this time, he is otherwise doing well and has brought no other complaints to my attention today.  For a list of the medical issues addressed today, see the assessment and plan below.    PMH:   Past Medical History:   Diagnosis Date   ??? CAD (coronary artery disease)     heart attack 2011   ??? Chronic obstructive pulmonary disease (HCC)    ??? Hypercholesterolemia    ??? Hypertension      PSH:  has a past surgical history that includes pr cardiac surg procedure unlist and hx other surgical (02/05/2018).    All: has No Known Allergies.   MEDS:   Current Outpatient Medications   Medication Sig   ??? glipiZIDE (GLUCOTROL) 5 mg tablet Take 1 Tab by mouth two (2) times a day. (Patient taking differently: Take 5 mg by mouth daily.)   ??? clopidogrel (PLAVIX) 75 mg tab Take 75 mg by mouth daily.   ??? cyclobenzaprine (FLEXERIL) 10 mg tablet TAKE 1 TABLET BY MOUTH 3 TIMES A DAY AS NEEDED FOR MUSCLE SPASMS    ??? metFORMIN (GLUCOPHAGE) 500 mg tablet TAKE 1 TABLET BY MOUTH TWICE A DAY WITH MEALS   ??? rosuvastatin (CRESTOR) 20 mg tablet Take 1 Tab by mouth nightly.   ??? lisinopril (PRINIVIL, ZESTRIL) 10 mg tablet Take 1 Tab by mouth daily.   ??? glucose blood VI test strips (ACCU-CHEK SMARTVIEW TEST STRIP) strip Test blood sugar every morning fasting.   ??? glucose blood VI test strips (ACCU-CHEK SMARTVIEW TEST STRIP) strip by Does Not Apply route See Admin Instructions.   ??? metoprolol succinate (TOPROL-XL) 50 mg XL tablet Take 50 mg by mouth daily.   ??? omega-3 fatty acids-vitamin e 1,000 mg cap Take 1 Cap by mouth daily.   ??? aspirin delayed-release 81 mg tablet Take 81 mg by mouth daily.   ??? multivitamin (ONE A DAY) tablet Take 1 Tab by mouth daily.   ??? glucosamine-chondroitin (ARTHX) 500-400 mg cap Take 1 Cap by mouth daily.   ??? metFORMIN ER (GLUCOPHAGE XR) 500 mg tablet TAKE 1 TABLET BY MOUTH EVERY DAY (Patient not taking: Reported on 09/22/2018)     No current facility-administered medications for this visit.        FH: family history includes Diabetes in his brother; Heart Disease in his father; Hypertension in his mother.   SH:  reports that  he quit smoking about 19 months ago. He has a 40.00 pack-year smoking history. He has never used smokeless tobacco. He reports that he does not drink alcohol or use drugs.     Review of Systems - History obtained from the patient  General ROS: no fever, chills, fatigue, body aches  Psychological ROS: no change in anxiety, depression, SI/HI  Ophthalmic ROS: no blurred vision, myopia, double vision  ENT ROS: no dysphagia, otalgia, otorrhea, rhinorrhea, post nasal drip  Respiratory ROS: no cough, shortness of breath, or wheezing  Cardiovascular ROS: no chest pain or dyspnea on exertion  Gastrointestinal ROS: no abdominal pain, change in bowel habits, or black or bloody stools  Genito-Urinary ROS: no frequency, urgency, incontinence, dysuria, hematuria   Musculoskeletal ROS: no arthralgia, myalgia  Neurological ROS: no headaches, dizziness, lightheadedness, tremors, seizures  Dermatological ROS: +knot on R flank.    OBJECTIVE:   Vitals:   Visit Vitals  BP 100/65 (BP 1 Location: Left arm, BP Patient Position: Sitting)   Pulse (!) 58   Temp 98 ??F (36.7 ??C) (Oral)   Resp 16   Ht 5\' 10"  (1.778 m)   Wt 229 lb (103.9 kg)   SpO2 97%   BMI 32.86 kg/m??      Gen: Pleasant 62 y.o.  male in NAD.    HEENT: PERRLA. EOMI. OP moist and pink.    Neck: Supple.  No LAD.   HEART: RRR, No M/G/R.      LUNGS: CTAB No W/R.      EXTREMITIES: Warm. No C/C/E.    NEURO: Alert and oriented x 3.  Cranial nerves grossly intact.  No focal sensory or motor deficits noted.   SKIN:  +flat, lemon-sized subcutaneous mass on R hip. Solid, not fixed.    ASSESSMENT/ PLAN: Diagnoses and all orders for this visit:    1. Controlled type 2 diabetes mellitus without complication, with long-term current use of insulin (HCC)  -     METABOLIC PANEL, COMPREHENSIVE  -     HEMOGLOBIN A1C WITH EAG    2. Immunization due  -     INFLUENZA VIRUS VAC QUAD,SPLIT,PRESV FREE SYRINGE IM    3. Mass of subcutaneous tissue  -     US EXT NONVAS RT LTD; Future        ICD-10-CM ICD-9-CM    1. Controlled type 2 diabetes mellitus without complication, with long-term current use of insulin (HCC) E11.9 250.00 METABOLIC PANEL, COMPREHENSIVE    Z79.4 V58.67 HEMOGLOBIN A1C WITH EAG   2. Immunization due Z23 V05.9 INFLUENZA VIRUS VAC QUAD,SPLIT,PRESV FREE SYRINGE IM   3. Mass of subcutaneous tissue R22.9 782.2 US EXT NONVAS RT LTD      1. DM type 2  Pt's blood sugar seems to be better controlled. I advised pt to continue current dose of glipizide and metformin, avoid sugars and starches, and to increase exercise when possible. Recheck hemoglobin A1c and CMP.     2. Immunization Due  Influenza Virus VAC Quad was administered today.    3. Mass of Subcutaneous Tissue  I ordered an US to ensure it is only fatty tissue.     I advised pt to only take 5mg  of lisinopril and continue monitoring his BP at home.     I advised pt to take Claritin to relieve cold sx.     Follow-up and Dispositions    ?? Return in about 3 months (around 12/23/2018) for follow up.  I have reviewed the patient's medications and risks/side effects/benefits were discussed. Diagnosis(-es) explained to patient and questions answered. Literature provided where appropriate.     Written by Lyndon Code, [employer], as dictated by Gabrielle Dare, MD.

## 2018-09-22 NOTE — Progress Notes (Signed)
Identified pt with two pt identifiers(name and DOB). Reviewed record in preparation for visit and have obtained necessary documentation.    Chief Complaint   Patient presents with   . Diabetes     3 month f/u         Visit Vitals  BP 100/65 (BP 1 Location: Left arm, BP Patient Position: Sitting)   Pulse (!) 58   Temp 98 F (36.7 C) (Oral)   Resp 16   Ht 5\' 10"  (1.778 m)   Wt 229 lb (103.9 kg)   SpO2 97%   BMI 32.86 kg/m       Health Maintenance Due   Topic   . FOOT EXAM Q1    . EYE EXAM RETINAL OR DILATED    . DTaP/Tdap/Td series (1 - Tdap)   . Shingrix Vaccine Age 68> (1 of 2)   . FOBT Q 1 YEAR AGE 81-75    . Influenza Age 70 to Adult    . HEMOGLOBIN A1C Q6M        Med Reconciliation: Completed    Coordination of Care Questionnaire:  :   1) Have you been to an emergency room, urgent care, or hospitalized since your last visit? If yes, where when, and reason for visit?  No       2. Have seen or consulted any other health care provider since your last visit?   If yes, where when, and reason for visit? No       3) Do you have an Advanced Directive/ Living Will in place? No  If yes, do we have a copy on file   If no, would you like information     Patient is accompanied by self I have received verbal consent from LEANDRA CHISUM to discuss any/all medical information while they are present in the room.

## 2018-09-22 NOTE — Progress Notes (Signed)
SUBJECTIVE:   [name] is a 62 y.o. male who is here for follow up of routine medical issues.      DM: Pt is compliant in taking metformin and glipizide. He has cut down to one 5mg  tablet of glipizide instead of two, and two 500mg  tablets of metformin every day. Patient denies numbness/tingling in extremities, fatigue, dizziness, polydipsia, polyuria, polyphagia, pancreatitis, CP, SOB, problems urinating, changes in bowel habits, increased sugar consumption, sore/bleeding gums, blurred vision, or cuts that will not heal. He reports that his vision has improved. Last HgA1c was 6.8% on 07/22/2017. Pt reports that he changed his eating habits. Pt notes that his BG is between 105 and 125 ever morning. He states that he has been going to the gym to exercise regularly.    Pt c/o a knot on his R flank that is more evident when he leans to the left side and has been present for approx 1 yr. Pt denies associated pain.     PREVENTIVE:  Colonoscopy: UTD, 5 yr f/u (12/24/2016)  Flu: Administered Today.    At this time, he is otherwise doing well and has brought no other complaints to my attention today.  For a list of the medical issues addressed today, see the assessment and plan below.    PMH:   Past Medical History:   Diagnosis Date   ??? CAD (coronary artery disease)     heart attack 2011   ??? Chronic obstructive pulmonary disease (HCC)    ??? Hypercholesterolemia    ??? Hypertension      PSH:  has a past surgical history that includes pr cardiac surg procedure unlist and hx other surgical (02/05/2018).    All: has No Known Allergies.   MEDS:   Current Outpatient Medications   Medication Sig   ??? glipiZIDE (GLUCOTROL) 5 mg tablet Take 1 Tab by mouth two (2) times a day. (Patient taking differently: Take 5 mg by mouth daily.)   ??? clopidogrel (PLAVIX) 75 mg tab Take 75 mg by mouth daily.   ??? cyclobenzaprine (FLEXERIL) 10 mg tablet TAKE 1 TABLET BY MOUTH 3 TIMES A DAY AS NEEDED FOR MUSCLE SPASMS   ??? metFORMIN (GLUCOPHAGE) 500  mg tablet TAKE 1 TABLET BY MOUTH TWICE A DAY WITH MEALS   ??? rosuvastatin (CRESTOR) 20 mg tablet Take 1 Tab by mouth nightly.   ??? lisinopril (PRINIVIL, ZESTRIL) 10 mg tablet Take 1 Tab by mouth daily.   ??? glucose blood VI test strips (ACCU-CHEK SMARTVIEW TEST STRIP) strip Test blood sugar every morning fasting.   ??? glucose blood VI test strips (ACCU-CHEK SMARTVIEW TEST STRIP) strip by Does Not Apply route See Admin Instructions.   ??? metoprolol succinate (TOPROL-XL) 50 mg XL tablet Take 50 mg by mouth daily.   ??? omega-3 fatty acids-vitamin e 1,000 mg cap Take 1 Cap by mouth daily.   ??? aspirin delayed-release 81 mg tablet Take 81 mg by mouth daily.   ??? multivitamin (ONE A DAY) tablet Take 1 Tab by mouth daily.   ??? glucosamine-chondroitin (ARTHX) 500-400 mg cap Take 1 Cap by mouth daily.   ??? metFORMIN ER (GLUCOPHAGE XR) 500 mg tablet TAKE 1 TABLET BY MOUTH EVERY DAY (Patient not taking: Reported on 09/22/2018)     No current facility-administered medications for this visit.        FH: family history includes Diabetes in his brother; Heart Disease in his father; Hypertension in his mother.   SH:  reports that  he quit smoking about 19 months ago. He has a 40.00 pack-year smoking history. He has never used smokeless tobacco. He reports that he does not drink alcohol or use drugs.     Review of Systems - History obtained from the patient  General ROS: no fever, chills, fatigue, body aches  Psychological ROS: no change in anxiety, depression, SI/HI  Ophthalmic ROS: no blurred vision, myopia, double vision  ENT ROS: no dysphagia, otalgia, otorrhea, rhinorrhea, post nasal drip  Respiratory ROS: no cough, shortness of breath, or wheezing  Cardiovascular ROS: no chest pain or dyspnea on exertion  Gastrointestinal ROS: no abdominal pain, change in bowel habits, or black or bloody stools  Genito-Urinary ROS: no frequency, urgency, incontinence, dysuria, hematuria  Musculoskeletal ROS: no arthralgia, myalgia  Neurological ROS: no  headaches, dizziness, lightheadedness, tremors, seizures  Dermatological ROS: +knot on R flank.    OBJECTIVE:   Vitals:   Visit Vitals  BP 100/65 (BP 1 Location: Left arm, BP Patient Position: Sitting)   Pulse (!) 58   Temp 98 ??F (36.7 ??C) (Oral)   Resp 16   Ht 5\' 10"  (1.778 m)   Wt 229 lb (103.9 kg)   SpO2 97%   BMI 32.86 kg/m??      Gen: Pleasant 62 y.o.  male in NAD.    HEENT: PERRLA. EOMI. OP moist and pink.    Neck: Supple.  No LAD.   HEART: RRR, No M/G/R.      LUNGS: CTAB No W/R.      EXTREMITIES: Warm. No C/C/E.    NEURO: Alert and oriented x 3.  Cranial nerves grossly intact.  No focal sensory or motor deficits noted.   SKIN:  +flat, lemon-sized subcutaneous mass on R hip. Solid, not fixed.    ASSESSMENT/ PLAN: Diagnoses and all orders for this visit:    1. Controlled type 2 diabetes mellitus without complication, with long-term current use of insulin (HCC)  -     METABOLIC PANEL, COMPREHENSIVE  -     HEMOGLOBIN A1C WITH EAG    2. Immunization due  -     INFLUENZA VIRUS VAC QUAD,SPLIT,PRESV FREE SYRINGE IM    3. Mass of subcutaneous tissue  -     US EXT NONVAS RT LTD; Future        ICD-10-CM ICD-9-CM    1. Controlled type 2 diabetes mellitus without complication, with long-term current use of insulin (HCC) E11.9 250.00 METABOLIC PANEL, COMPREHENSIVE    Z79.4 V58.67 HEMOGLOBIN A1C WITH EAG   2. Immunization due Z23 V05.9 INFLUENZA VIRUS VAC QUAD,SPLIT,PRESV FREE SYRINGE IM   3. Mass of subcutaneous tissue R22.9 782.2 US EXT NONVAS RT LTD      1. DM type 2  Pt's blood sugar seems to be better controlled. I advised pt to continue current dose of glipizide and metformin, avoid sugars and starches, and to increase exercise when possible. Recheck hemoglobin A1c and CMP.     2. Immunization Due  Influenza Virus VAC Quad was administered today.    3. Mass of Subcutaneous Tissue  I ordered an US to ensure it is only fatty tissue.    I advised pt to only take 5mg  of lisinopril and continue monitoring his BP at home.      I advised pt to take Claritin to relieve cold sx.     Follow-up and Dispositions    ?? Return in about 3 months (around 12/23/2018) for follow up.  I have reviewed the patient's medications and risks/side effects/benefits were discussed. Diagnosis(-es) explained to patient and questions answered. Literature provided where appropriate.     Written by Lyndon Code, [employer], as dictated by Gabrielle Dare, MD.

## 2018-09-23 LAB — COMPREHENSIVE METABOLIC PANEL
ALT: 17 IU/L (ref 0–44)
AST: 18 IU/L (ref 0–40)
Albumin/Globulin Ratio: 2 NA (ref 1.2–2.2)
Albumin: 4.5 g/dL (ref 3.6–4.8)
Alkaline Phosphatase: 44 IU/L (ref 39–117)
BUN: 16 mg/dL (ref 8–27)
Bun/Cre Ratio: 18 NA (ref 10–24)
CO2: 22 mmol/L (ref 20–29)
Calcium: 9.6 mg/dL (ref 8.6–10.2)
Chloride: 104 mmol/L (ref 96–106)
Creatinine: 0.87 mg/dL (ref 0.76–1.27)
EGFR IF NonAfrican American: 94 mL/min/{1.73_m2} (ref >59)
GFR African American: 108 mL/min/{1.73_m2} (ref >59)
Globulin, Total: 2.2 g/dL (ref 1.5–4.5)
Glucose: 75 mg/dL (ref 65–99)
Potassium: 4.5 mmol/L (ref 3.5–5.2)
Sodium: 142 mmol/L (ref 134–144)
Total Bilirubin: 0.3 mg/dL (ref 0.0–1.2)
Total Protein: 6.7 g/dL (ref 6.0–8.5)

## 2018-09-23 LAB — HEMOGLOBIN A1C W/EAG
Hemoglobin A1C: 6.5 % — ABNORMAL HIGH (ref 4.8–5.6)
eAG: 140 mg/dL

## 2018-09-23 LAB — METABOLIC PANEL, COMPREHENSIVE
A-G Ratio: 2 (ref 1.2–2.2)
ALT (SGPT): 17 IU/L (ref 0–44)
AST (SGOT): 18 IU/L (ref 0–40)
Albumin: 4.5 g/dL (ref 3.6–4.8)
Alk. phosphatase: 44 IU/L (ref 39–117)
BUN/Creatinine ratio: 18 (ref 10–24)
BUN: 16 mg/dL (ref 8–27)
Bilirubin, total: 0.3 mg/dL (ref 0.0–1.2)
CO2: 22 mmol/L (ref 20–29)
Calcium: 9.6 mg/dL (ref 8.6–10.2)
Chloride: 104 mmol/L (ref 96–106)
Creatinine: 0.87 mg/dL (ref 0.76–1.27)
GFR est AA: 108 mL/min/{1.73_m2} (ref >59)
GFR est non-AA: 94 mL/min/{1.73_m2} (ref >59)
GLOBULIN, TOTAL: 2.2 g/dL (ref 1.5–4.5)
Glucose: 75 mg/dL (ref 65–99)
Potassium: 4.5 mmol/L (ref 3.5–5.2)
Protein, total: 6.7 g/dL (ref 6.0–8.5)
Sodium: 142 mmol/L (ref 134–144)

## 2018-09-23 LAB — HEMOGLOBIN A1C WITH EAG
Estimated average glucose: 140 mg/dL
Hemoglobin A1c: 6.5 % — ABNORMAL HIGH (ref 4.8–5.6)

## 2018-09-29 ENCOUNTER — Ambulatory Visit: Payer: Medicare Other | Admitting: Cardiology

## 2018-09-30 NOTE — Progress Notes (Signed)
Patient has graduated from the Disease Specific Care Management  program on 09/30/18 for diabetes. A1c was 6.5% on 09/22/18, down from 11.9% on 06/25/18.  Patient's symptoms are stable at this time.  Patient/family has the ability to self-manage.   Care management goals have been completed at this time. No further nurse navigator follow up scheduled.    Pt has nurse navigator's contact information for any further questions, concerns, or needs.  Patients upcoming visits:    Future Appointments   Date Time Provider Department Center   10/04/2018  9:00 AM SPT [place] 1 [facility] SPT        Goals Addressed                 This Visit's Progress    . Patient verbalizes understanding of self -management goals of living with Diabetes.        09/30/18-dkw  -a1c down to 6.5% on 09/22/18, down from 11.9% in July  -DSCM episode resolved    08/04/18-dkw  -pt stated doing well  -picked up glipizide yesterday and has not started taking it yet; is taking metformin   -blood sugars are mid 120s to upper 130s  -doing well with healthy plate meal planning  -is going to the [facility]  -feeling better and can see better  -will follow up with Dr. Emilee Hero in October as scheduled  -in the meantime will call Dianne if needs anything  -will follow      06/25/18-dkw  -start checking blood sugars fasting every morning   -increase dose of metformin as instructed by Dr. Emilee Hero today; will check with Dr. Emilee Hero on maximizing dose to 2000 mg daily and extended release formulation  -go back to the [facility] three times a week  -start following the healthy plate meal plan again, being mindful of what is a carb (fruit, dairy, grains, starchy vegetables) and portion size; guideline is up to 45-60 grams of carb per meal (3 meals per day) or 3-4 servings per meal; snack guideline - 1 oz. protein serving and may add 1 carb serving  -eliminate sweets  -call Dianne with any questions at [phone]  -follow up with Dr. Emilee Hero in three months  -will follow

## 2018-09-30 NOTE — Progress Notes (Signed)
Patient has graduated from the Disease Specific Care Management  program on 09/30/18 for diabetes. A1c was 6.5% on 09/22/18, down from 11.9% on 06/25/18.  Patient's symptoms are stable at this time.  Patient/family has the ability to self-manage.   Care management goals have been completed at this time. No further nurse navigator follow up scheduled.    Pt has nurse navigator's contact information for any further questions, concerns, or needs.  Patients upcoming visits:    Future Appointments   Date Time Provider Department Center   10/04/2018  9:00 AM SPT [place] 1 [facility] SPT        Goals Addressed                 This Visit's Progress    ??? Patient verbalizes understanding of self -management goals of living with Diabetes.        09/30/18-dkw  -a1c down to 6.5% on 09/22/18, down from 11.9% in July  -DSCM episode resolved    08/04/18-dkw  -pt stated doing well  -picked up glipizide yesterday and has not started taking it yet; is taking metformin   -blood sugars are mid 120s to upper 130s  -doing well with healthy plate meal planning  -is going to the [employer]  -feeling better and can see better  -will follow up with Dr. Latham in October as scheduled  -in the meantime will call Aadith Raudenbush if needs anything  -will follow      06/25/18-dkw  -start checking blood sugars fasting every morning   -increase dose of metformin as instructed by Dr. Latham today; will check with Dr. Latham on maximizing dose to 2000 mg daily and extended release formulation  -go back to the [employer] three times a week  -start following the healthy plate meal plan again, being mindful of what is a carb (fruit, dairy, grains, starchy vegetables) and portion size; guideline is up to 45-60 grams of carb per meal (3 meals per day) or 3-4 servings per meal; snack guideline - 1 oz. protein serving and may add 1 carb serving  -eliminate sweets  -call Chloeanne Poteet with any questions at [phone]  -follow up with Dr. Latham in three months  -will follow

## 2018-10-04 ENCOUNTER — Inpatient Hospital Stay: Payer: MEDICAID | Attending: Family Medicine | Primary: Family Medicine

## 2018-10-06 MED ORDER — GLIPIZIDE 5 MG TAB
5 mg | ORAL_TABLET | Freq: Every day | ORAL | 2 refills | Status: DC
Start: 2018-10-06 — End: 2018-12-31

## 2018-10-08 ENCOUNTER — Inpatient Hospital Stay: Admit: 2018-10-08 | Payer: BLUE CROSS/BLUE SHIELD | Attending: Family Medicine | Primary: Family Medicine

## 2018-10-08 DIAGNOSIS — R2241 Localized swelling, mass and lump, right lower limb: Secondary | ICD-10-CM

## 2018-10-18 ENCOUNTER — Encounter: Payer: Self-pay | Admitting: *Deleted

## 2018-10-19 ENCOUNTER — Ambulatory Visit (INDEPENDENT_AMBULATORY_CARE_PROVIDER_SITE_OTHER): Payer: Medicare Other | Admitting: Cardiology

## 2018-10-19 ENCOUNTER — Encounter: Payer: Self-pay | Admitting: *Deleted

## 2018-10-19 VITALS — BP 165/76 | HR 60 | Ht 70.0 in | Wt 188.6 lb

## 2018-10-19 DIAGNOSIS — I10 Essential (primary) hypertension: Secondary | ICD-10-CM

## 2018-10-19 DIAGNOSIS — I2510 Atherosclerotic heart disease of native coronary artery without angina pectoris: Secondary | ICD-10-CM

## 2018-10-19 DIAGNOSIS — I5022 Chronic systolic (congestive) heart failure: Secondary | ICD-10-CM

## 2018-10-19 DIAGNOSIS — I255 Ischemic cardiomyopathy: Secondary | ICD-10-CM

## 2018-10-19 MED ORDER — ATORVASTATIN CALCIUM 80 MG PO TABS: 80.0000 mg | ORAL_TABLET | Freq: Every day | ORAL | 1 refills | Status: DC

## 2018-10-19 MED ORDER — FUROSEMIDE 20 MG PO TABS
ORAL_TABLET | ORAL | 1 refills | Status: DC
Start: 2018-10-19 — End: 2021-07-12

## 2018-10-19 MED ORDER — TICAGRELOR 90 MG PO TABS: 90.0000 mg | ORAL_TABLET | Freq: Two times a day (BID) | ORAL | 1 refills | Status: DC

## 2018-10-19 MED ORDER — METOPROLOL SUCCINATE ER 200 MG PO TB24: ORAL_TABLET | ORAL | 1 refills | Status: DC

## 2018-10-19 MED ORDER — SACUBITRIL-VALSARTAN 97-103 MG PO TABS: 1.0000 | ORAL_TABLET | Freq: Two times a day (BID) | ORAL | 1 refills | Status: DC

## 2018-10-19 NOTE — Patient Instructions (Signed)
Your physician recommends that you schedule a follow-up appointment in: 4 MONTHS WITH DR BRANCH  Your physician recommends that you continue on your current medications as directed. Please refer to the Current Medication list given to you today.  Thank you for choosing [facility]!!    

## 2018-10-19 NOTE — Progress Notes (Signed)
Clinical Summary Mr. Nin is a 62 y.o.male seen today for follow up of the following medical problems.     1. CAD/Chronic systolic HF - 06/2009 CABG x 3 (LIMA-LAD, SVG-diag, SVG-PDA). He presented with an anterior MI.  - from prior clinic notes, LVEF 40% (do not see echo report) at that time.  -11/2016 echo that showed LVEF 15%, diffuse hypokinesis.  - Jan 2018 cath as reported below, received DES to distal SVG-rPDA. RHC with CI 2, mean PA 39, PCWP 29, LVEDP 22 - 10/2017 echo LVEF 25% - he has turned down ICD - not interested in cardiac rehab.  -intervention cards recs for indefinitie brillinta.    - no recent chest pain. No edema. Chronic SOB.  - compliant with meds, though only taking entresto and brillinta once daily on his own - borderline high K, have not started aldactone.     2. SOB/COPD - +wheezing. Uses prn inhaler with improvement - had tried breo with pcp without benefit  3. PAD - followed by vascular   4. Carotid stenosis -01/2018 carotid US: RICA 1-39%, LICA 60-79%.    5. HTN  - has not taken meds yet today  Past Medical History:  Diagnosis Date  . Anterior myocardial infarction (HCC) 06/2009   [name] 12/16/2016  . Anxiety   . Anxiety disorder   . Chest pain   . Chronic brain syndrome   . Elevated blood pressure   . Tobacco abuse      No Known Allergies   Current Outpatient Medications  Medication Sig Dispense Refill  . albuterol (PROVENTIL) (2.5 MG/3ML) 0.083% nebulizer solution Inhale 2.5 mg into the lungs every 4 (four) hours as needed for wheezing or shortness of breath.   99  . aspirin EC 81 MG tablet Take 81 mg by mouth daily.    Marland Kitchen atorvastatin (LIPITOR) 80 MG tablet TAKE ONE TABLET BY MOUTH DAILY 90 tablet 3  . BRILINTA 90 MG TABS tablet TAKE ONE TABLET BY MOUTH TWICE DAILY. 180 tablet 1  . ENTRESTO 97-103 MG TAKE ONE TABLET BY MOUTH TWICE DAILY 60 tablet 3  . furosemide (LASIX) 20 MG tablet TAKE 1 TAB DAILY AS  NEEDED FOR SWELLING 90 tablet 3  . metoprolol succinate (TOPROL-XL) 200 MG 24 hr tablet Take 1 tablet daily. 90 tablet 3  . nitroGLYCERIN (NITROSTAT) 0.4 MG SL tablet Place 1 tablet (0.4 mg total) under the tongue every 5 (five) minutes as needed for chest pain. 25 tablet 2  . PROAIR HFA 108 (90 Base) MCG/ACT inhaler Inhale 2 puffs into the lungs 4 (four) times daily as needed for shortness of breath.      No current facility-administered medications for this visit.      Past Surgical History:  Procedure Laterality Date  . CARDIAC CATHETERIZATION  06/2009   [name] 12/16/2016  . CARDIAC CATHETERIZATION N/A 12/18/2016   Procedure: Right/Left Heart Cath and Coronary/Graft Angiography;  Surgeon: Yvonne Kendall, MD;  Location: [facility] INVASIVE CV LAB;  Service: Cardiovascular;  Laterality: N/A;  . CARDIAC CATHETERIZATION N/A 12/18/2016   Procedure: Coronary Stent Intervention;  Surgeon: Yvonne Kendall, MD;  Location: [facility] INVASIVE CV LAB;  Service: Cardiovascular;  Laterality: N/A;  SVG-PDA  . CORONARY ANGIOPLASTY    . CORONARY ARTERY BYPASS GRAFT  2010   X3  . LAPAROSCOPIC CHOLECYSTECTOMY  2007     No Known Allergies    Family History  Problem Relation Age of Onset  . Coronary artery disease Unknown  Social History Mr. Su HiltRoberts reports that he has been smoking. He has a 44.00 pack-year smoking history. He has never used smokeless tobacco. Mr. Su HiltRoberts reports that he does not drink alcohol.   Review of Systems CONSTITUTIONAL: No weight loss, fever, chills, weakness or fatigue.  HEENT: Eyes: No visual loss, blurred vision, double vision or yellow sclerae.No hearing loss, sneezing, congestion, runny nose or sore throat.  SKIN: No rash or itching.  CARDIOVASCULAR: per hpi RESPIRATORY: No shortness of breath, cough or sputum.  GASTROINTESTINAL: No anorexia, nausea, vomiting or diarrhea. No abdominal pain or blood.  GENITOURINARY: No burning on urination, no polyuria NEUROLOGICAL: No  headache, dizziness, syncope, paralysis, ataxia, numbness or tingling in the extremities. No change in bowel or bladder control.  MUSCULOSKELETAL: No muscle, back pain, joint pain or stiffness.  LYMPHATICS: No enlarged nodes. No history of splenectomy.  PSYCHIATRIC: No history of depression or anxiety.  ENDOCRINOLOGIC: No reports of sweating, cold or heat intolerance. No polyuria or polydipsia.  Marland Kitchen.   Physical Examination Vitals:   10/19/18 1123  BP: (!) 165/76  Pulse: 60  SpO2: 98%   Vitals:   10/19/18 1123  Weight: 188 lb 9.6 oz (85.5 kg)  Height: 5\' 10"  (1.778 m)    Gen: resting comfortably, no acute distress HEENT: no scleral icterus, pupils equal round and reactive, no palptable cervical adenopathy,  CV: RRR, no m/r/g, no jvd Resp: Clear to auscultation bilaterally GI: abdomen is soft, non-tender, non-distended, normal bowel sounds, no hepatosplenomegaly MSK: extremities are warm, no edema.  Skin: warm, no rash Neuro:  no focal deficits Psych: appropriate affect   Diagnostic Studies 06/2009 cath  IMPRESSION: 1. Severe global left ventricular dysfunction with an ejection fraction of approximately 25% with significant hypocontractility to akinesis involving an inferior wall hypocontractility anterolaterally and significant hypocontractility in the inferolateral wall. 2. Significant multivessel coronary obstructive disease with separate ostium giving rise to the left anterior descending and left circumflex vessels with diffuse 70% ostial proximal left anterior descending stenosis, 60% left anterior descending stenosis after the takeoff of the first septal and diagonal vessel and bifurcation 95% and 80% septal mid left anterior descending stenosis. 3. Large circumflex system with questionable ostial catheter spasm. 4. Total occlusion of the right coronary artery with extensive left-to- right collaterals. 5. An  80% left vertebral artery narrowing arising from the left circumflex system with evidence for plaque in the proximal subclavian system with a widely patent unbypassed left internal mammary artery. 6. 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  RECOMMENDATIONS: The patient will be evaluated for consideration of CABG revascularization surgery.   Jan 2018 cath Conclusions: 1. Significant 2-vessel native coronary artery disease, including moderate diffuse proximal LAD disease and chronic total occlusion of mid LAD and ostial RCA. 70% proximal D1 stenosis is also present. 2. Moderate, non-obstructive disease involving LCx. Small Liya Strollo of OM2 is chronically occluded. 3. Widely patent LIMA->LAD. 4. Patent SVG->rPDA with mild diffuse disease and discrete 95% stenosis at the distal anastomosis. 5. Chronically occluded SVG->D1. 6. Mildly elevated left and right heart filling pressures, as well as moderate pulmonary hypertension. 7. Normal to mildly reduced cardiac output. 8. Successful PCI to distal SVG->rPDA anastomosis using a Resolute Onxy 4.0 x 12 mm drug-eluting stent with 0% residual stenosis and TIMI-3 flow.  Recommendations: 1. Admit for overnight observation following PCI with severe systolic dysfunction. 2. Dual antiplatelet therapy with aspirin and ticagrelor for at least 6-12 months, ideally indefinitely. If patient  experiences respiratory side effects from ticagrelor, he could be switched to clopidogrel. 3. Avoid further IV hydration today; restart diuresis tomorrow if renal function allows. 4. Aggressive medical therapy for severe ischemic cardiomyopathy. If renal function stable, can consider starting ACEI tomorrow. 5. Close outpatient follow-up with Dr. Wyline Mood.   10/2017 echo Study Conclusions  - Left ventricle: The cavity size was mildly dilated. Wall thickness  was increased in a pattern of mild LVH. Systolic function was severely reduced. The estimated ejection fraction was 25%. Diffuse hypokinesis. There is akinesis of the basal-midinferior myocardium. Features are consistent with a pseudonormal left ventricular filling pattern, with concomitant abnormal relaxation and increased filling pressure (grade 2 diastolic dysfunction). - Aortic valve: Mildly calcified annulus. Trileaflet. - Mitral valve: Mildly thickened leaflets . There was trivial regurgitation. - Right atrium: Central venous pressure (est): 3 mm Hg. - Tricuspid valve: There was trivial regurgitation. - Pulmonary arteries: PA peak pressure: 25 mm Hg (S). - Pericardium, extracardiac: There was no pericardial effusion.  Impressions:  - Mild LVH with mild chamber dilatation and LVEF approximately 25%. Diffuse hypokinesis with mid to basal inferior akinesis. Grade 2 diastolic dysfunction. Mildly thickened mitral leaflets with trivial mitral regurgitation. Trivial tricuspid regurgitation with PASP 25 mmHg.       Assessment and Plan  1. CAD/ICM/Chronic systolic HF -asked to take his entresto and brillinta bid as prescribed - continue current meds. Have not started aldactone due to high normal K on last check, may consider in future pending labs. - EKG shows SR, chronic ST/T changes  2. HTN - elevated today but has not taken meds yet, continue to monitor    F/u 4 months       Antoine Poche, M.D.

## 2018-11-17 DIAGNOSIS — F17210 Nicotine dependence, cigarettes, uncomplicated: Secondary | ICD-10-CM | POA: Diagnosis not present

## 2018-11-17 DIAGNOSIS — Z299 Encounter for prophylactic measures, unspecified: Secondary | ICD-10-CM | POA: Diagnosis not present

## 2018-11-17 DIAGNOSIS — Z6827 Body mass index (BMI) 27.0-27.9, adult: Secondary | ICD-10-CM | POA: Diagnosis not present

## 2018-11-17 DIAGNOSIS — Z125 Encounter for screening for malignant neoplasm of prostate: Secondary | ICD-10-CM | POA: Diagnosis not present

## 2018-11-17 DIAGNOSIS — R5383 Other fatigue: Secondary | ICD-10-CM | POA: Diagnosis not present

## 2018-11-17 DIAGNOSIS — Z0000 Encounter for general adult medical examination without abnormal findings: Secondary | ICD-10-CM | POA: Diagnosis not present

## 2018-11-17 DIAGNOSIS — Z1339 Encounter for screening examination for other mental health and behavioral disorders: Secondary | ICD-10-CM | POA: Diagnosis not present

## 2018-11-17 DIAGNOSIS — B370 Candidal stomatitis: Secondary | ICD-10-CM | POA: Diagnosis not present

## 2018-11-17 DIAGNOSIS — I429 Cardiomyopathy, unspecified: Secondary | ICD-10-CM | POA: Diagnosis not present

## 2018-11-17 DIAGNOSIS — Z1211 Encounter for screening for malignant neoplasm of colon: Secondary | ICD-10-CM | POA: Diagnosis not present

## 2018-11-17 DIAGNOSIS — Z7189 Other specified counseling: Secondary | ICD-10-CM | POA: Diagnosis not present

## 2018-11-17 DIAGNOSIS — Z1331 Encounter for screening for depression: Secondary | ICD-10-CM | POA: Diagnosis not present

## 2018-11-17 DIAGNOSIS — E7800 Pure hypercholesterolemia, unspecified: Secondary | ICD-10-CM | POA: Diagnosis not present

## 2018-11-17 DIAGNOSIS — Z79899 Other long term (current) drug therapy: Secondary | ICD-10-CM | POA: Diagnosis not present

## 2018-11-18 ENCOUNTER — Encounter: Payer: Self-pay | Admitting: Cardiology

## 2018-11-24 ENCOUNTER — Ambulatory Visit: Payer: Medicare Other | Admitting: Vascular Surgery

## 2018-11-24 ENCOUNTER — Encounter (HOSPITAL_COMMUNITY): Payer: Medicare Other

## 2018-11-25 DIAGNOSIS — Z79899 Other long term (current) drug therapy: Secondary | ICD-10-CM | POA: Diagnosis not present

## 2018-11-25 DIAGNOSIS — F17200 Nicotine dependence, unspecified, uncomplicated: Secondary | ICD-10-CM | POA: Diagnosis not present

## 2018-11-25 DIAGNOSIS — Z7982 Long term (current) use of aspirin: Secondary | ICD-10-CM | POA: Diagnosis not present

## 2018-11-25 DIAGNOSIS — I509 Heart failure, unspecified: Secondary | ICD-10-CM | POA: Diagnosis not present

## 2018-11-25 DIAGNOSIS — R0989 Other specified symptoms and signs involving the circulatory and respiratory systems: Secondary | ICD-10-CM | POA: Diagnosis not present

## 2018-11-25 DIAGNOSIS — J441 Chronic obstructive pulmonary disease with (acute) exacerbation: Secondary | ICD-10-CM | POA: Diagnosis not present

## 2018-11-25 DIAGNOSIS — Z955 Presence of coronary angioplasty implant and graft: Secondary | ICD-10-CM | POA: Diagnosis not present

## 2018-11-25 DIAGNOSIS — Z720 Tobacco use: Secondary | ICD-10-CM | POA: Diagnosis not present

## 2018-11-25 DIAGNOSIS — R0602 Shortness of breath: Secondary | ICD-10-CM | POA: Diagnosis not present

## 2018-11-25 DIAGNOSIS — Z951 Presence of aortocoronary bypass graft: Secondary | ICD-10-CM | POA: Diagnosis not present

## 2018-11-25 DIAGNOSIS — I252 Old myocardial infarction: Secondary | ICD-10-CM | POA: Diagnosis not present

## 2018-12-28 ENCOUNTER — Other Ambulatory Visit: Payer: Self-pay

## 2018-12-29 ENCOUNTER — Ambulatory Visit: Payer: Medicare Other | Admitting: Vascular Surgery

## 2018-12-29 ENCOUNTER — Encounter (HOSPITAL_COMMUNITY): Payer: Medicare Other

## 2018-12-31 MED ORDER — GLIPIZIDE 5 MG TAB
5 mg | ORAL_TABLET | ORAL | 1 refills | Status: DC
Start: 2018-12-31 — End: 2019-06-28

## 2019-01-10 ENCOUNTER — Other Ambulatory Visit: Payer: Self-pay | Admitting: Cardiology

## 2019-01-18 ENCOUNTER — Encounter

## 2019-01-18 MED ORDER — CYCLOBENZAPRINE 10 MG TAB
10 mg | ORAL_TABLET | ORAL | 1 refills | Status: DC
Start: 2019-01-18 — End: 2019-10-17

## 2019-02-02 ENCOUNTER — Ambulatory Visit (INDEPENDENT_AMBULATORY_CARE_PROVIDER_SITE_OTHER): Payer: Medicare Other

## 2019-02-02 DIAGNOSIS — I6523 Occlusion and stenosis of bilateral carotid arteries: Secondary | ICD-10-CM

## 2019-02-08 ENCOUNTER — Telehealth: Payer: Self-pay | Admitting: *Deleted

## 2019-02-08 DIAGNOSIS — I6523 Occlusion and stenosis of bilateral carotid arteries: Secondary | ICD-10-CM

## 2019-02-08 NOTE — Telephone Encounter (Signed)
-----   Message from Antoine Poche, MD sent at 02/08/2019 11:48 AM EST ----- Carotid US shows mild blockage on right, moderate on left. We will continue to monitor.

## 2019-02-08 NOTE — Telephone Encounter (Signed)
Patient informed. 

## 2019-02-22 ENCOUNTER — Encounter: Payer: Self-pay | Admitting: *Deleted

## 2019-02-23 ENCOUNTER — Encounter

## 2019-02-23 ENCOUNTER — Ambulatory Visit: Payer: Medicare Other | Admitting: Cardiology

## 2019-02-24 MED ORDER — METFORMIN SR 500 MG 24 HR TABLET
500 mg | ORAL_TABLET | ORAL | 1 refills | Status: DC
Start: 2019-02-24 — End: 2019-08-25

## 2019-04-18 DIAGNOSIS — F17210 Nicotine dependence, cigarettes, uncomplicated: Secondary | ICD-10-CM | POA: Diagnosis not present

## 2019-04-18 DIAGNOSIS — I472 Ventricular tachycardia: Secondary | ICD-10-CM | POA: Diagnosis not present

## 2019-04-18 DIAGNOSIS — Z299 Encounter for prophylactic measures, unspecified: Secondary | ICD-10-CM | POA: Diagnosis not present

## 2019-04-18 DIAGNOSIS — Z6823 Body mass index (BMI) 23.0-23.9, adult: Secondary | ICD-10-CM | POA: Diagnosis not present

## 2019-04-18 DIAGNOSIS — I25119 Atherosclerotic heart disease of native coronary artery with unspecified angina pectoris: Secondary | ICD-10-CM | POA: Diagnosis not present

## 2019-04-18 DIAGNOSIS — I739 Peripheral vascular disease, unspecified: Secondary | ICD-10-CM | POA: Diagnosis not present

## 2019-04-18 DIAGNOSIS — J449 Chronic obstructive pulmonary disease, unspecified: Secondary | ICD-10-CM | POA: Diagnosis not present

## 2019-04-25 ENCOUNTER — Telehealth: Payer: Self-pay | Admitting: *Deleted

## 2019-04-25 NOTE — Telephone Encounter (Signed)
   Primary Cardiologist:  [name], MD   Patient contacted.  History reviewed.  No symptoms to suggest any unstable cardiac conditions.  Based on discussion, with current pandemic situation, we will be postponing this appointment for Dave [name] with a plan for f/u on 07/20/2019.  If symptoms change, he has been instructed to contact our office.   Patient prefers face to face and states he is doing fine.    Dave [name]

## 2019-04-26 ENCOUNTER — Ambulatory Visit: Payer: Medicare Other | Admitting: Cardiology

## 2019-05-10 ENCOUNTER — Other Ambulatory Visit: Payer: Self-pay

## 2019-05-10 MED ORDER — SACUBITRIL-VALSARTAN 97-103 MG PO TABS: 1.0000 | ORAL_TABLET | Freq: Two times a day (BID) | ORAL | 3 refills | Status: DC

## 2019-05-10 MED ORDER — TICAGRELOR 90 MG PO TABS: 90.0000 mg | ORAL_TABLET | Freq: Two times a day (BID) | ORAL | 3 refills | Status: DC

## 2019-05-10 NOTE — Telephone Encounter (Signed)
Refilled brilinta and entresto

## 2019-06-25 ENCOUNTER — Other Ambulatory Visit: Payer: Self-pay | Admitting: Cardiology

## 2019-06-29 MED ORDER — GLIPIZIDE 5 MG TAB
5 mg | ORAL_TABLET | ORAL | 1 refills | Status: DC
Start: 2019-06-29 — End: 2019-12-29

## 2019-07-14 ENCOUNTER — Encounter: Payer: Self-pay | Admitting: Cardiology

## 2019-07-14 DIAGNOSIS — I252 Old myocardial infarction: Secondary | ICD-10-CM | POA: Diagnosis not present

## 2019-07-14 DIAGNOSIS — Z7982 Long term (current) use of aspirin: Secondary | ICD-10-CM | POA: Diagnosis not present

## 2019-07-14 DIAGNOSIS — Z79899 Other long term (current) drug therapy: Secondary | ICD-10-CM | POA: Diagnosis not present

## 2019-07-14 DIAGNOSIS — F17210 Nicotine dependence, cigarettes, uncomplicated: Secondary | ICD-10-CM | POA: Diagnosis not present

## 2019-07-14 DIAGNOSIS — F419 Anxiety disorder, unspecified: Secondary | ICD-10-CM | POA: Diagnosis not present

## 2019-07-14 DIAGNOSIS — J449 Chronic obstructive pulmonary disease, unspecified: Secondary | ICD-10-CM | POA: Diagnosis not present

## 2019-07-14 DIAGNOSIS — R42 Dizziness and giddiness: Secondary | ICD-10-CM | POA: Diagnosis not present

## 2019-07-15 DIAGNOSIS — Z299 Encounter for prophylactic measures, unspecified: Secondary | ICD-10-CM | POA: Diagnosis not present

## 2019-07-15 DIAGNOSIS — R42 Dizziness and giddiness: Secondary | ICD-10-CM | POA: Diagnosis not present

## 2019-07-15 DIAGNOSIS — I25119 Atherosclerotic heart disease of native coronary artery with unspecified angina pectoris: Secondary | ICD-10-CM | POA: Diagnosis not present

## 2019-07-15 DIAGNOSIS — J449 Chronic obstructive pulmonary disease, unspecified: Secondary | ICD-10-CM | POA: Diagnosis not present

## 2019-07-15 DIAGNOSIS — I739 Peripheral vascular disease, unspecified: Secondary | ICD-10-CM | POA: Diagnosis not present

## 2019-07-15 DIAGNOSIS — Z6827 Body mass index (BMI) 27.0-27.9, adult: Secondary | ICD-10-CM | POA: Diagnosis not present

## 2019-07-20 ENCOUNTER — Telehealth (INDEPENDENT_AMBULATORY_CARE_PROVIDER_SITE_OTHER): Payer: Medicare Other | Admitting: Cardiology

## 2019-07-20 ENCOUNTER — Encounter: Payer: Self-pay | Admitting: Cardiology

## 2019-07-20 ENCOUNTER — Encounter: Payer: Self-pay | Admitting: *Deleted

## 2019-07-20 VITALS — Ht 70.0 in | Wt 190.0 lb

## 2019-07-20 DIAGNOSIS — I10 Essential (primary) hypertension: Secondary | ICD-10-CM

## 2019-07-20 DIAGNOSIS — I5022 Chronic systolic (congestive) heart failure: Secondary | ICD-10-CM

## 2019-07-20 DIAGNOSIS — I2510 Atherosclerotic heart disease of native coronary artery without angina pectoris: Secondary | ICD-10-CM

## 2019-07-20 NOTE — Patient Instructions (Signed)
Medication Instructions:  Continue all current medications.  Labwork: none  Testing/Procedures: none  Follow-Up: 4 months   Any Other Special Instructions Will Be Listed Below (If Applicable).  If you need a refill on your cardiac medications before your next appointment, please call your pharmacy.\ 

## 2019-07-20 NOTE — Progress Notes (Signed)
Virtual Visit via Telephone Note   This visit type was conducted due to national recommendations for restrictions regarding the COVID-19 Pandemic (e.g. social distancing) in an effort to limit this patient's exposure and mitigate transmission in our community.  Due to his co-morbid illnesses, this patient is at least at moderate risk for complications without adequate follow up.  This format is felt to be most appropriate for this patient at this time.  The patient did not have access to video technology/had technical difficulties with video requiring transitioning to audio format only (telephone).  All issues noted in this document were discussed and addressed.  No physical exam could be performed with this format.  Please refer to the patient's chart for his  consent to telehealth for [facility].   Date:  07/20/2019   ID:  Dave [name], DOB 04/21/1956, MRN [id number]  Patient Location: Home Provider Location: Office  PCP:  Ignatius SpeckingVyas, Dhruv B, MD  Cardiologist:  Dina RichBranch, Yudith Norlander, MD  Electrophysiologist:  None   Evaluation Performed:  Follow-Up Visit  Chief Complaint:  Follow up visit  History of Present Illness:    Dave [name] is a 63 y.o. male seen today for follow up of the following medical problems.     1. CAD/Chronic systolic HF - 06/2009 CABG x 3 (LIMA-LAD, SVG-diag, SVG-PDA). He presented with an anterior MI.  - from prior clinic notes, LVEF 40% (do not see echo report) at that time.  -11/2016 echo that showed LVEF 15%, diffuse hypokinesis.  - Jan 2018 cath as reported below, received DES to distal SVG-rPDA. RHC with CI 2, mean PA 39, PCWP 29, LVEDP 22 - 10/2017 echo LVEF 25% - he has turned down ICD - not interested in cardiac rehab.  -intervention cards recs for indefinitie brillinta.   - borderline high K, have not started aldactone.    - denies recent chest pain. No SOB/DOE - compliant with meds   2. SOB/COPD -follwoed by pcp  3. PAD -  followed by vascular   4. Carotid stenosis -01/2018 carotid US: RICA 1-39%, LICA 60-79%. - 01/2019 carotid US: RICA 8-65%1-39%, LIC 78-46%60-79% - no recent symptoms  5. HTN  - he is compliant with meds   The patient does not have symptoms concerning for COVID-19 infection (fever, chills, cough, or new shortness of breath).    Past Medical History:  Diagnosis Date  . Anterior myocardial infarction (HCC) 06/2009   [name]/notes 12/16/2016  . Anxiety   . Anxiety disorder   . Chest pain   . Chronic brain syndrome   . Elevated blood pressure   . Tobacco abuse    Past Surgical History:  Procedure Laterality Date  . CARDIAC CATHETERIZATION  06/2009   [name]/notes 12/16/2016  . CARDIAC CATHETERIZATION N/A 12/18/2016   Procedure: Right/Left Heart Cath and Coronary/Graft Angiography;  Surgeon: Yvonne Kendallhristopher End, MD;  [facility];  Service: Cardiovascular;  Laterality: N/A;  . CARDIAC CATHETERIZATION N/A 12/18/2016   Procedure: Coronary Stent Intervention;  Surgeon: Yvonne Kendallhristopher End, MD;  Location: [facility] INVASIVE CV LAB;  Service: Cardiovascular;  Laterality: N/A;  SVG-PDA  . CORONARY ANGIOPLASTY    . CORONARY ARTERY BYPASS GRAFT  2010   X3  . LAPAROSCOPIC CHOLECYSTECTOMY  2007     Current Meds  Medication Sig  . albuterol (PROVENTIL) (2.5 MG/3ML) 0.083% nebulizer solution Inhale 2.5 mg into the lungs every 4 (four) hours as needed for wheezing or shortness of breath.   Marland Kitchen. aspirin EC 81 MG  tablet Take 81 mg by mouth daily.  Marland Kitchen atorvastatin (LIPITOR) 80 MG tablet TAKE ONE TABLET BY MOUTH DAILY.  . furosemide (LASIX) 20 MG tablet TAKE 1 TAB DAILY AS NEEDED FOR SWELLING  . metoprolol (TOPROL-XL) 200 MG 24 hr tablet TAKE ONE TABLET BY MOUTH DAILY.  . nitroGLYCERIN (NITROSTAT) 0.4 MG SL tablet Place 1 tablet (0.4 mg total) under the tongue every 5 (five) minutes as needed for chest pain.  Marland Kitchen PROAIR HFA 108 (90 Base) MCG/ACT inhaler Inhale 2 puffs into the lungs 4 (four) times daily as needed for  shortness of breath.   . sacubitril-valsartan (ENTRESTO) 97-103 MG Take 1 tablet by mouth 2 (two) times daily.  . ticagrelor (BRILINTA) 90 MG TABS tablet Take 1 tablet (90 mg total) by mouth 2 (two) times daily.     Allergies:   Patient has no known allergies.   Social History   Tobacco Use  . Smoking status: Current Every Day Smoker    Packs/day: 1.00    Years: 44.00    Pack years: 44.00    Start date: 02/07/1971  . Smokeless tobacco: Never Used  . Tobacco comment: 1 pack per day  Substance Use Topics  . Alcohol use: No  . Drug use: Yes    Types: Marijuana    Comment: 12/18/2016 "experimented when I was younger with different things; now only smoke marijuana"     Family Hx: The patient's family history includes Coronary artery disease in an other family member.  ROS:   Please see the history of present illness.     All other systems reviewed and are negative.   Prior CV studies:   The following studies were reviewed today:  Labs/Other Tests and Data Reviewed:    EKG:  No ECG reviewed.  Recent Labs: No results found for requested labs within last 8760 hours.   Recent Lipid Panel Lab Results  Component Value Date/Time   CHOL (H) 06/13/2009 05:04 AM    230        ATP III CLASSIFICATION:  <200     mg/dL   Desirable  200-239  mg/dL   Borderline High  >=240    mg/dL   High          TRIG 218 (H) 06/13/2009 05:04 AM   HDL 23 (L) 06/13/2009 05:04 AM   CHOLHDL 10.0 06/13/2009 05:04 AM   LDLCALC (H) 06/13/2009 05:04 AM    163        Total Cholesterol/HDL:CHD Risk Coronary Heart Disease Risk Table                     Men   Women  1/2 Average Risk   3.4   3.3  Average Risk       5.0   4.4  2 X Average Risk   9.6   7.1  3 X Average Risk  23.4   11.0        Use the calculated Patient Ratio above and the CHD Risk Table to determine the patient's CHD Risk.        ATP III CLASSIFICATION (LDL):  <100     mg/dL   Optimal  100-129  mg/dL   Near or Above                     Optimal  130-159  mg/dL   Borderline  160-189  mg/dL   High  >190     mg/dL  Very High    Wt Readings from Last 3 Encounters:  07/20/19 190 lb (86.2 kg)  10/19/18 188 lb 9.6 oz (85.5 kg)  05/18/18 191 lb 3.2 oz (86.7 kg)     Objective:    Vital Signs:  Ht 5\' 10"  (1.778 m)   Wt 190 lb (86.2 kg)   BMI 27.26 kg/m    Normal affect. Normal speech pattern and tone. Comfortable, no apparent distress, no audible signs of SOB or wheezing.   ASSESSMENT & PLAN:    1. CAD/ICM/Chronic systolic HF -no recent symptoms, continue current meds  2. HTN - continue current meds      COVID-19 Education: The signs and symptoms of COVID-19 were discussed with the patient and how to seek care for testing (follow up with PCP or arrange E-visit).  The importance of social distancing was discussed today.  Time:   Today, I have spent 13 minutes with the patient with telehealth technology discussing the above problems.     Medication Adjustments/Labs and Tests Ordered: Current medicines are reviewed at length with the patient today.  Concerns regarding medicines are outlined above.   Tests Ordered: No orders of the defined types were placed in this encounter.   Medication Changes: No orders of the defined types were placed in this encounter.   Follow Up:  In Person in 4 month(s)  Signed, Dina RichBranch, Deja Pisarski, MD  07/20/2019 2:20 PM    [facility]

## 2019-07-21 DIAGNOSIS — Z299 Encounter for prophylactic measures, unspecified: Secondary | ICD-10-CM | POA: Diagnosis not present

## 2019-07-21 DIAGNOSIS — I739 Peripheral vascular disease, unspecified: Secondary | ICD-10-CM | POA: Diagnosis not present

## 2019-07-21 DIAGNOSIS — F17210 Nicotine dependence, cigarettes, uncomplicated: Secondary | ICD-10-CM | POA: Diagnosis not present

## 2019-07-21 DIAGNOSIS — Z6827 Body mass index (BMI) 27.0-27.9, adult: Secondary | ICD-10-CM | POA: Diagnosis not present

## 2019-07-21 DIAGNOSIS — I472 Ventricular tachycardia: Secondary | ICD-10-CM | POA: Diagnosis not present

## 2019-07-21 DIAGNOSIS — J449 Chronic obstructive pulmonary disease, unspecified: Secondary | ICD-10-CM | POA: Diagnosis not present

## 2019-07-21 DIAGNOSIS — I25119 Atherosclerotic heart disease of native coronary artery with unspecified angina pectoris: Secondary | ICD-10-CM | POA: Diagnosis not present

## 2019-08-25 ENCOUNTER — Encounter

## 2019-08-26 MED ORDER — METFORMIN SR 500 MG 24 HR TABLET
500 mg | ORAL_TABLET | ORAL | 1 refills | Status: DC
Start: 2019-08-26 — End: 2020-02-22

## 2019-08-26 NOTE — Telephone Encounter (Signed)
See my chart message.

## 2019-09-30 DIAGNOSIS — B370 Candidal stomatitis: Secondary | ICD-10-CM | POA: Diagnosis not present

## 2019-09-30 DIAGNOSIS — F17210 Nicotine dependence, cigarettes, uncomplicated: Secondary | ICD-10-CM | POA: Diagnosis not present

## 2019-09-30 DIAGNOSIS — I25119 Atherosclerotic heart disease of native coronary artery with unspecified angina pectoris: Secondary | ICD-10-CM | POA: Diagnosis not present

## 2019-09-30 DIAGNOSIS — J449 Chronic obstructive pulmonary disease, unspecified: Secondary | ICD-10-CM | POA: Diagnosis not present

## 2019-09-30 DIAGNOSIS — Z6827 Body mass index (BMI) 27.0-27.9, adult: Secondary | ICD-10-CM | POA: Diagnosis not present

## 2019-09-30 DIAGNOSIS — Z299 Encounter for prophylactic measures, unspecified: Secondary | ICD-10-CM | POA: Diagnosis not present

## 2019-09-30 DIAGNOSIS — Z23 Encounter for immunization: Secondary | ICD-10-CM | POA: Diagnosis not present

## 2019-09-30 DIAGNOSIS — I739 Peripheral vascular disease, unspecified: Secondary | ICD-10-CM | POA: Diagnosis not present

## 2019-10-17 ENCOUNTER — Ambulatory Visit: Admit: 2019-10-17 | Payer: BLUE CROSS/BLUE SHIELD | Attending: Family Medicine | Primary: Family Medicine

## 2019-10-17 ENCOUNTER — Ambulatory Visit: Attending: Family Medicine | Primary: Family Medicine

## 2019-10-17 DIAGNOSIS — Z0000 Encounter for general adult medical examination without abnormal findings: Secondary | ICD-10-CM

## 2019-10-17 MED ORDER — CYCLOBENZAPRINE 10 MG TAB
10 mg | ORAL_TABLET | ORAL | 1 refills | Status: AC
Start: 2019-10-17 — End: ?

## 2019-10-17 NOTE — Progress Notes (Signed)
SUBJECTIVE:   Anthony [name] is a 63 y.o. male who is here for complete physical exam.    Pt specifically denies changes in vision or hearing, trouble with swallowing or taste, CP, SOB, heartburn or upset stomach, change in bowel habits, unusual joint or muscle pains, numbness or tingling in extremities, or skin lesions of concern. He has maintained his work schedule during the pandemic. He endorses urinary frequency.    He has a knot on his right lower flank that becomes painful after consistent pressure on the area. 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     His cardiologist added amlodipine 5 mg tablet to his HTN management along with increasing his rosuvastatin to 20 mg tablet every day for hyperlipidemia management. He had a CBC, CMP, and lipid panel done with his cardiologist as well about 2-3 weeks ago. His HDL was low. He has not been exercising regularly.     PREVENTIVE:  Colonoscopy: 12/2016, Dr. Truman Hayward, 5 yr f/u  PSA: Ordered (03/2018, 1.0)  PPSV-23: 07/2017  Flu: 10/17/19     At this time, he is otherwise doing well and has brought no other complaints to my attention today. For a list of the medical issues addressed today, see the assessment and plan below.      PMH:   Past Medical History:   Diagnosis Date   ??? CAD (coronary artery disease)     heart attack 2011   ??? Chronic obstructive pulmonary disease (HCC)    ??? Hypercholesterolemia    ??? Hypertension        PSH:  has a past surgical history that includes pr cardiac surg procedure unlist and hx other surgical (02/05/2018).    Allergies: has No Known Allergies.    Meds:   Current Outpatient Medications   Medication Sig   ??? amLODIPine (NORVASC) 5 mg tablet    ??? cyclobenzaprine (FLEXERIL) 10 mg tablet TAKE 1 TABLET BY MOUTH THREE TIMES A DAY AS NEEDED FOR MUSCLE SPASMS   ??? metFORMIN ER (GLUCOPHAGE XR) 500 mg tablet TAKE 1 TABLET BY MOUTH EVERY DAY   ??? glipiZIDE (GLUCOTROL) 5 mg tablet TAKE 1 TABLET BY MOUTH EVERY DAY   ??? clopidogrel (PLAVIX) 75  mg tab Take 75 mg by mouth daily.   ??? rosuvastatin (CRESTOR) 20 mg tablet Take 1 Tab by mouth nightly.   ??? lisinopril (PRINIVIL, ZESTRIL) 10 mg tablet Take 1 Tab by mouth daily.   ??? glucose blood VI test strips (ACCU-CHEK SMARTVIEW TEST STRIP) strip Test blood sugar every morning fasting.   ??? glucose blood VI test strips (ACCU-CHEK SMARTVIEW TEST STRIP) strip by Does Not Apply route See Admin Instructions.   ??? metoprolol succinate (TOPROL-XL) 50 mg XL tablet Take 50 mg by mouth daily.   ??? omega-3 fatty acids-vitamin e 1,000 mg cap Take 1 Cap by mouth daily.   ??? aspirin delayed-release 81 mg tablet Take 81 mg by mouth daily.   ??? multivitamin (ONE A DAY) tablet Take 1 Tab by mouth daily.   ??? glucosamine-chondroitin (ARTHX) 500-400 mg cap Take 1 Cap by mouth daily.     No current facility-administered medications for this visit.        Fam hx: family history includes Diabetes in his brother; Heart Disease in his father; Hypertension in his mother.    Soc hx:  reports that he quit smoking about 2 years ago. He has a 40.00 pack-year smoking history. He has never used smokeless tobacco.  He reports that he does not drink alcohol or use drugs.      Review of Systems - History obtained from the patient  General ROS: negative  Psychological ROS: negative  Ophthalmic ROS: negative  ENT ROS: negative  Respiratory ROS: no cough, shortness of breath, or wheezing  Cardiovascular ROS: no chest pain or dyspnea on exertion  Gastrointestinal ROS: no abdominal pain, change in bowel habits, or black or bloody stools  Genito-Urinary ROS: +urinary frequency  Musculoskeletal ROS: +back muscle spasm  Neurological ROS: negative  Dermatological ROS: negative    OBJECTIVE:   Vitals:   Visit Vitals  BP 121/78 (BP 1 Location: Left arm, BP Patient Position: Sitting)   Pulse 66   Temp 97 ??F (36.1 ??C) (Temporal)   Resp 16   Ht 5\' 10"  (1.778 m)   Wt 248 lb (112.5 kg)   SpO2 96%   BMI 35.58 kg/m??     Gen: Pleasant 63 y.o. male in NAD.   HEENT:  PERRLA. EOMI. OP moist and pink.    EARS: TMs normal and canals equal bilaterally.    NECK: Supple.  No LAD. No thyromegaly.    HEART: RRR, No M/G/R.     LUNGS: CTAB No W/R.    ABDOMEN: S, NT, ND, BS+.     EXTREMITIES: Warm. No C/C/E.    MUSCULOSKELETAL: Normal ROM, muscle strength 5/5 all groups.    NEURO: Alert and oriented x 3.  Cranial nerves grossly intact.  No focal sensory or motor deficits noted.   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 No erythema Warm. Dry. No rashes or other lesions noted.    ASSESSMENT/ PLAN:     Diagnoses and all orders for this visit:    1. Annual physical exam  -     cyclobenzaprine (FLEXERIL) 10 mg tablet; TAKE 1 TABLET BY MOUTH THREE TIMES A DAY AS NEEDED FOR MUSCLE SPASMS  -     HEMOGLOBIN A1C WITH EAG  -     MICROALBUMIN, UR, RAND W/ MICROALB/CREAT RATIO  -     PSA, DIAGNOSTIC (PROSTATE SPECIFIC AG)  -     REFERRAL TO UROLOGY    2. Needs flu shot  -     INFLUENZA VIRUS VAC QUAD,SPLIT,PRESV FREE SYRINGE IM        1. Annual Physical Exam  Anthony [name] physical exam was normal and urinalysis was clear. Pt was given lab orders for a PSA, Microalbumin, and HbA1c to have done when he is fasting. He had his flu shot completed today for health maintenance. I continued Flexeril 10 mg tablet TID PRN for muscle spasm management. I provided a referral to Dr. Dola Factor (urology) for further evaluation of enlarged prostate.  He will follow-up with his surgeon for reevaluation of the lipoma.      Follow-up and Dispositions    ?? Return in about 1 year (around 10/16/2020) for CPE and follow up 6 mo.     I have reviewed the patient's medications and risks/side effects/benefits were discussed. Diagnosis(-es) explained to patient and questions answered. Literature provided where appropriate.     Written by 13/08/2020, Scribekick, as dictated by Alyse Low, MD.

## 2019-10-17 NOTE — Progress Notes (Signed)
Identified pt with two pt identifiers(name and DOB). Reviewed record in preparation for visit and have obtained necessary documentation.    Chief Complaint   Patient presents with   ??? Complete Physical        Visit Vitals  BP 121/78 (BP 1 Location: Left arm, BP Patient Position: Sitting)   Pulse 66   Temp 97 ??F (36.1 ??C) (Temporal)   Resp 16   Ht 5' 10" (1.778 m)   Wt 248 lb (112.5 kg)   SpO2 96%   BMI 35.58 kg/m??       Health Maintenance Due   Topic   ??? Foot Exam Q1    ??? Eye Exam Retinal or Dilated    ??? DTaP/Tdap/Td series (1 - Tdap)   ??? Shingrix Vaccine Age 50> (1 of 2)   ??? Lipid Screen    ??? MICROALBUMIN Q1    ??? Flu Vaccine (1)   ??? A1C test (Diabetic or Prediabetic)        Med Reconciliation: Completed    Coordination of Care Questionnaire:  :   1) Have you been to an emergency room, urgent care, or hospitalized since your last visit? If yes, where when, and reason for visit?  No       2. Have seen or consulted any other health care provider since your last visit?   If yes, where when, and reason for visit? No       3) Do you have an Advanced Directive/ Living Will in place? No  If yes, do we have a copy on file   If no, would you like information     Patient is accompanied by self I have received verbal consent from Anthony [name] to discuss any/all medical information while they are present in the room.

## 2019-10-17 NOTE — Progress Notes (Signed)
SUBJECTIVE:   Anthony [name] is a 63 y.o. male who is here for complete physical exam.    Pt specifically denies changes in vision or hearing, trouble with swallowing or taste, CP, SOB, heartburn or upset stomach, change in bowel habits, unusual joint or muscle pains, numbness or tingling in extremities, or skin lesions of concern. He has maintained his work schedule during the pandemic. He endorses urinary frequency.    He has a knot on his right lower flank that becomes painful after consistent pressure on the area. 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     His cardiologist added amlodipine 5 mg tablet to his HTN management along with increasing his rosuvastatin to 20 mg tablet every day for hyperlipidemia management. He had a CBC, CMP, and lipid panel done with his cardiologist as well about 2-3 weeks ago. His HDL was low. He has not been exercising regularly.     PREVENTIVE:  Colonoscopy: 12/2016, Dr. Nedra Hai, 5 yr f/u  PSA: Ordered (03/2018, 1.0)  PPSV-23: 07/2017  Flu: 10/17/19     At this time, he is otherwise doing well and has brought no other complaints to my attention today. For a list of the medical issues addressed today, see the assessment and plan below.      PMH:   Past Medical History:   Diagnosis Date   ??? CAD (coronary artery disease)     heart attack 2011   ??? Chronic obstructive pulmonary disease (HCC)    ??? Hypercholesterolemia    ??? Hypertension        PSH:  has a past surgical history that includes pr cardiac surg procedure unlist and hx other surgical (02/05/2018).    Allergies: has No Known Allergies.    Meds:   Current Outpatient Medications   Medication Sig   ??? amLODIPine (NORVASC) 5 mg tablet    ??? cyclobenzaprine (FLEXERIL) 10 mg tablet TAKE 1 TABLET BY MOUTH THREE TIMES A DAY AS NEEDED FOR MUSCLE SPASMS   ??? metFORMIN ER (GLUCOPHAGE XR) 500 mg tablet TAKE 1 TABLET BY MOUTH EVERY DAY   ??? glipiZIDE (GLUCOTROL) 5 mg tablet TAKE 1 TABLET BY MOUTH EVERY DAY    ??? clopidogrel (PLAVIX) 75 mg tab Take 75 mg by mouth daily.   ??? rosuvastatin (CRESTOR) 20 mg tablet Take 1 Tab by mouth nightly.   ??? lisinopril (PRINIVIL, ZESTRIL) 10 mg tablet Take 1 Tab by mouth daily.   ??? glucose blood VI test strips (ACCU-CHEK SMARTVIEW TEST STRIP) strip Test blood sugar every morning fasting.   ??? glucose blood VI test strips (ACCU-CHEK SMARTVIEW TEST STRIP) strip by Does Not Apply route See Admin Instructions.   ??? metoprolol succinate (TOPROL-XL) 50 mg XL tablet Take 50 mg by mouth daily.   ??? omega-3 fatty acids-vitamin e 1,000 mg cap Take 1 Cap by mouth daily.   ??? aspirin delayed-release 81 mg tablet Take 81 mg by mouth daily.   ??? multivitamin (ONE A DAY) tablet Take 1 Tab by mouth daily.   ??? glucosamine-chondroitin (ARTHX) 500-400 mg cap Take 1 Cap by mouth daily.     No current facility-administered medications for this visit.        Fam hx: family history includes Diabetes in his brother; Heart Disease in his father; Hypertension in his mother.    Soc hx:  reports that he quit smoking about 2 years ago. He has a 40.00 pack-year smoking history. He has never used smokeless tobacco.  He reports that he does not drink alcohol or use drugs.      Review of Systems - History obtained from the patient  General ROS: negative  Psychological ROS: negative  Ophthalmic ROS: negative  ENT ROS: negative  Respiratory ROS: no cough, shortness of breath, or wheezing  Cardiovascular ROS: no chest pain or dyspnea on exertion  Gastrointestinal ROS: no abdominal pain, change in bowel habits, or black or bloody stools  Genito-Urinary ROS: +urinary frequency  Musculoskeletal ROS: +back muscle spasm  Neurological ROS: negative  Dermatological ROS: negative    OBJECTIVE:   Vitals:   Visit Vitals  BP 121/78 (BP 1 Location: Left arm, BP Patient Position: Sitting)   Pulse 66   Temp 97 ??F (36.1 ??C) (Temporal)   Resp 16   Ht 5\' 10"  (1.778 m)   Wt 248 lb (112.5 kg)   SpO2 96%   BMI 35.58 kg/m??      Gen: Pleasant 63 y.o. male in NAD.   HEENT: PERRLA. EOMI. OP moist and pink.    EARS: TMs normal and canals equal bilaterally.    NECK: Supple.  No LAD. No thyromegaly.    HEART: RRR, No M/G/R.     LUNGS: CTAB No W/R.    ABDOMEN: S, NT, ND, BS+.     EXTREMITIES: Warm. No C/C/E.    MUSCULOSKELETAL: Normal ROM, muscle strength 5/5 all groups.    NEURO: Alert and oriented x 3.  Cranial nerves grossly intact.  No focal sensory or motor deficits noted.   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 No erythema Warm. Dry. No rashes or other lesions noted.    ASSESSMENT/ PLAN:     Diagnoses and all orders for this visit:    1. Annual physical exam  -     cyclobenzaprine (FLEXERIL) 10 mg tablet; TAKE 1 TABLET BY MOUTH THREE TIMES A DAY AS NEEDED FOR MUSCLE SPASMS  -     HEMOGLOBIN A1C WITH EAG  -     MICROALBUMIN, UR, RAND W/ MICROALB/CREAT RATIO  -     PSA, DIAGNOSTIC (PROSTATE SPECIFIC AG)  -     REFERRAL TO UROLOGY    2. Needs flu shot  -     INFLUENZA VIRUS VAC QUAD,SPLIT,PRESV FREE SYRINGE IM        1. Annual Physical Exam  Hawk Mones Camargo's physical exam was normal and urinalysis was clear. Pt was given lab orders for a PSA, Microalbumin, and HbA1c to have done when he is fasting. He had his flu shot completed today for health maintenance. I continued Flexeril 10 mg tablet TID PRN for muscle spasm management. I provided a referral to Dr. Blenda Mounts (urology) for further evaluation of enlarged prostate.  He will follow-up with his surgeon for reevaluation of the lipoma.      Follow-up and Dispositions    ?? Return in about 1 year (around 10/16/2020) for CPE and follow up 6 mo.     I have reviewed the patient's medications and risks/side effects/benefits were discussed. Diagnosis(-es) explained to patient and questions answered. Literature provided where appropriate.     Written by Lyndon Code, Scribekick, as dictated by Gabrielle Dare, MD.

## 2019-10-17 NOTE — Patient Instructions (Signed)
Vaccine Information Statement    Influenza (Flu) Vaccine (Inactivated or Recombinant): What You Need to Know    Many Vaccine Information Statements are available in Spanish and other languages. See www.immunize.org/vis  Hojas de informaci??n sobre vacunas est??n disponibles en espa??ol y en muchos otros idiomas. Visite www.immunize.org/vis    1. Why get vaccinated?    Influenza vaccine can prevent influenza (flu).    Flu is a contagious disease that spreads around the [place] every year, usually between October and May. Anyone can get the flu, but it is more dangerous for some people. Infants and young children, people 65 years of age and older, pregnant women, and people with certain health conditions or a weakened immune system are at greatest risk of flu complications.    Pneumonia, bronchitis, sinus infections and ear infections are examples of flu-related complications. If you have a medical condition, such as heart disease, cancer or diabetes, flu can make it worse.    Flu can cause fever and chills, sore throat, muscle aches, fatigue, cough, headache, and runny or stuffy nose. Some people may have vomiting and diarrhea, though this is more common in children than adults.     Each year thousands of people in the [place] die from flu, and many more are hospitalized. Flu vaccine prevents millions of illnesses and flu-related visits to the doctor each year.    2. Influenza vaccines     CDC recommends everyone 6 months of age and older get vaccinated every flu season. Children 6 months through 8 years of age may need 2 doses during a single flu season.  Everyone else needs only 1 dose each flu season.    It takes about 2 weeks for protection to develop after vaccination.    There are many flu viruses, and they are always changing. Each year a new flu vaccine is made to protect against three or four viruses that are likely to cause disease in the upcoming flu season. Even when the vaccine  doesn???t exactly match these viruses, it may still provide some protection.     Influenza vaccine does not cause flu.    Influenza vaccine may be given at the same time as other vaccines.    3. Talk with your health care provider    Tell your vaccine provider if the person getting the vaccine:  ??? Has had an allergic reaction after a previous dose of influenza vaccine, or has any severe, life-threatening allergies.   ??? Has ever had Guillain-Barr?? Syndrome (also called GBS).    In some cases, your health care provider may decide to postpone influenza vaccination to a future visit.    People with minor illnesses, such as a cold, may be vaccinated. People who are moderately or severely ill should usually wait until they recover before getting influenza vaccine.    Your health care provider can give you more information.    4. Risks of a reaction    ??? Soreness, redness, and swelling where shot is given, fever, muscle aches, and headache can happen after influenza vaccine.  ??? There may be a very small increased risk of Guillain-Barr?? Syndrome (GBS) after inactivated influenza vaccine (the flu shot).    Young children who get the flu shot along with pneumococcal vaccine (PCV13), and/or DTaP vaccine at the same time might be slightly more likely to have a seizure caused by fever. Tell your health care provider if a child who is getting flu vaccine has ever had a   seizure.    People sometimes faint after medical procedures, including vaccination. Tell your provider if you feel dizzy or have vision changes or ringing in the ears.    As with any medicine, there is a very remote chance of a vaccine causing a severe allergic reaction, other serious injury, or death.    5. What if there is a serious problem?    An allergic reaction could occur after the vaccinated person leaves the clinic. If you see signs of a severe allergic reaction (hives, swelling of the face and throat, difficulty breathing, a fast heartbeat, dizziness, or  weakness), call 9-1-1 and get the person to the nearest hospital.    For other signs that concern you, call your health care provider.    Adverse reactions should be reported to the Vaccine Adverse Event Reporting System (VAERS). Your health care provider will usually file this report, or you can do it yourself. Visit the VAERS website at www.vaers.hhs.gov or call 1-800-822-7967.  VAERS is only for reporting reactions, and VAERS staff do not give medical advice.    6. The National Vaccine Injury Compensation Program    The National Vaccine Injury Compensation Program (VICP) is a federal program that was created to compensate people who may have been injured by certain vaccines. Visit the VICP website at www.hrsa.gov/vaccinecompensation or call 1-800-338-2382 to learn about the program and about filing a claim. There is a time limit to file a claim for compensation.    7. How can I learn more?    ??? Ask your health care provider.   ??? Call your local or state health department.  ??? Contact the Centers for Disease Control and Prevention (CDC):  - Call 1-800-232-4636 (1-800-CDC-INFO) or  - Visit CDC???s influenza website at www.cdc.gov/flu    Vaccine Information Statement (Interim)  Inactivated Influenza Vaccine   07/22/2018  42 U.S.C. ?? 300aa-26   Department of Health and Human Services  Centers for Disease Control and Prevention    Office Use Only

## 2019-10-17 NOTE — Progress Notes (Signed)
 Identified pt with two pt identifiers(name and DOB). Reviewed record in preparation for visit and have obtained necessary documentation.    Chief Complaint   Patient presents with   . Complete Physical        Visit Vitals  BP 121/78 (BP 1 Location: Left arm, BP Patient Position: Sitting)   Pulse 66   Temp 97 F (36.1 C) (Temporal)   Resp 16   Ht 5' 10 (1.778 m)   Wt 248 lb (112.5 kg)   SpO2 96%   BMI 35.58 kg/m       Health Maintenance Due   Topic   . Foot Exam Q1    . Eye Exam Retinal or Dilated    . DTaP/Tdap/Td series (1 - Tdap)   . Shingrix Vaccine Age 25> (1 of 2)   . Lipid Screen    . MICROALBUMIN Q1    . Flu Vaccine (1)   . A1C test (Diabetic or Prediabetic)        Med Reconciliation: Completed    Coordination of Care Questionnaire:  :   1) Have you been to an emergency room, urgent care, or hospitalized since your last visit? If yes, where when, and reason for visit?  No       2. Have seen or consulted any other health care provider since your last visit?   If yes, where when, and reason for visit? No       3) Do you have an Advanced Directive/ Living Will in place? No  If yes, do we have a copy on file   If no, would you like information     Patient is accompanied by self I have received verbal consent from Anthony [name] to discuss any/all medical information while they are present in the room.

## 2019-10-28 ENCOUNTER — Other Ambulatory Visit: Payer: Self-pay | Admitting: Cardiology

## 2019-11-04 DIAGNOSIS — Z713 Dietary counseling and surveillance: Secondary | ICD-10-CM | POA: Diagnosis not present

## 2019-11-04 DIAGNOSIS — F17210 Nicotine dependence, cigarettes, uncomplicated: Secondary | ICD-10-CM | POA: Diagnosis not present

## 2019-11-04 DIAGNOSIS — Z6828 Body mass index (BMI) 28.0-28.9, adult: Secondary | ICD-10-CM | POA: Diagnosis not present

## 2019-11-04 DIAGNOSIS — J449 Chronic obstructive pulmonary disease, unspecified: Secondary | ICD-10-CM | POA: Diagnosis not present

## 2019-11-04 DIAGNOSIS — Z299 Encounter for prophylactic measures, unspecified: Secondary | ICD-10-CM | POA: Diagnosis not present

## 2019-11-10 ENCOUNTER — Other Ambulatory Visit: Payer: Self-pay | Admitting: Cardiology

## 2019-11-24 ENCOUNTER — Other Ambulatory Visit: Payer: Self-pay

## 2019-11-24 ENCOUNTER — Telehealth: Payer: Self-pay | Admitting: Cardiology

## 2019-11-24 ENCOUNTER — Ambulatory Visit (INDEPENDENT_AMBULATORY_CARE_PROVIDER_SITE_OTHER): Payer: Medicare Other | Admitting: Family Medicine

## 2019-11-24 ENCOUNTER — Encounter: Payer: Self-pay | Admitting: Family Medicine

## 2019-11-24 VITALS — BP 110/69 | HR 70 | Ht 70.0 in | Wt 195.4 lb

## 2019-11-24 DIAGNOSIS — I255 Ischemic cardiomyopathy: Secondary | ICD-10-CM

## 2019-11-24 DIAGNOSIS — I6523 Occlusion and stenosis of bilateral carotid arteries: Secondary | ICD-10-CM

## 2019-11-24 DIAGNOSIS — I10 Essential (primary) hypertension: Secondary | ICD-10-CM

## 2019-11-24 DIAGNOSIS — I2510 Atherosclerotic heart disease of native coronary artery without angina pectoris: Secondary | ICD-10-CM

## 2019-11-24 DIAGNOSIS — I739 Peripheral vascular disease, unspecified: Secondary | ICD-10-CM

## 2019-11-24 DIAGNOSIS — I5022 Chronic systolic (congestive) heart failure: Secondary | ICD-10-CM | POA: Diagnosis not present

## 2019-11-24 NOTE — Telephone Encounter (Signed)
  Precert needed for:  ABI  LE ARTERIAL US   Location:   [facility]  Date: Dec 14, 2018

## 2019-11-24 NOTE — Progress Notes (Addendum)
Cardiology Office Note  Date: 11/24/2019   ID: Dave [name], DOB 09-04-1956, MRN [id number]  PCP:  Glenda Chroman, MD  Cardiologist:  Carlyle Dolly, MD Electrophysiologist:  None   Chief Complaint  Patient presents with  . Follow-up    Coronary artery disease, PAD, dyspnea, carotid stenosis, hypertension    History of Present Illness: Dave [name] is a 63 y.o. male 63-month follow-up for coronary artery disease, PAD, shortness of breath, carotid stenosis, hypertension.  Previous encounter in August via televisit with Dr. Harl Bowie.  Patient had a recent visit to the emergency room in August for getting this and dizziness.  All of his diagnostic tests were negative for any acute pathology.  He has had no subsequent episodes.  He states his primary problem is breathing and shortness of breath with very little activity.  He has a long history of smoking with obvious COPD and is a current smoker.  He states he knows he needs to quit but cannot seem to stop.  Also complains of buttocks pain, thigh pain and calf pain when walking short distances and resolves after resting.  Given his coronary artery disease, smoking history, and carotid stenosis he likely has some peripheral arterial disease.  Medications reviewed with patient.  He denies any other recent acute illnesses, hospitalizations, surgeries, tick bites, or travels.  Denies any exposure to Covid or Covid-like symptoms.  Past Medical History:  Diagnosis Date  . 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 Anxiety   . Anxiety disorder   . Chest pain   . Chronic brain syndrome   . Elevated blood pressure   . Tobacco abuse     Past Surgical History:  Procedure Laterality Date  . CARDIAC CATHETERIZATION  06/2009   Archie Endo 12/16/2016  . CARDIAC CATHETERIZATION N/A 12/18/2016   Procedure: Right/Left Heart Cath and Coronary/Graft Angiography;  Surgeon: Nelva Bush, MD;  Location: [place] [facility];   Service: Cardiovascular;  Laterality: N/A;  . CARDIAC CATHETERIZATION N/A 12/18/2016   Procedure: Coronary Stent Intervention;  Surgeon: Nelva Bush, MD;  Location: [place] [facility];  Service: Cardiovascular;  Laterality: N/A;  SVG-PDA  . CORONARY ANGIOPLASTY    . CORONARY ARTERY BYPASS GRAFT  2010   X3  . LAPAROSCOPIC CHOLECYSTECTOMY  2007    Current Outpatient Medications  Medication Sig Dispense Refill  . albuterol (PROVENTIL) (2.5 MG/3ML) 0.083% nebulizer solution Inhale 2.5 mg into the lungs every 4 (four) hours as needed for wheezing or shortness of breath.   99  . aspirin EC 81 MG tablet Take 81 mg by mouth daily.    Marland Kitchen atorvastatin (LIPITOR) 80 MG tablet TAKE ONE TABLET BY MOUTH DAILY. 90 tablet 3  . furosemide (LASIX) 20 MG tablet TAKE 1 TAB DAILY AS NEEDED FOR SWELLING 90 tablet 1  . metoprolol (TOPROL-XL) 200 MG 24 hr tablet TAKE ONE TABLET BY MOUTH DAILY. 90 tablet 1  . nitroGLYCERIN (NITROSTAT) 0.4 MG SL tablet Place 1 tablet (0.4 mg total) under the tongue every 5 (five) minutes as needed for chest pain. 25 tablet 2  . PROAIR HFA 108 (90 Base) MCG/ACT inhaler Inhale 2 puffs into the lungs 4 (four) times daily as needed for shortness of breath.     . sacubitril-valsartan (ENTRESTO) 97-103 MG Take 1 tablet by mouth 2 (two) times daily. 180 tablet 3  . ticagrelor (BRILINTA) 90 MG TABS tablet Take 1 tablet (90 mg total) by mouth 2 (two) times daily.  180 tablet 3   No current facility-administered medications for this visit.   Allergies:  Patient has no known allergies.   Social History: The patient  reports that he has been smoking. He started smoking about 48 years ago. He has a 44.00 pack-year smoking history. He has never used smokeless tobacco. He reports current drug use. Drug: Marijuana. He reports that he does not drink alcohol.   Family History: The patient's family history includes Coronary artery disease in an other family member.   ROS:  Please see the history  of present illness. Otherwise, complete review of systems is positive for none.  All other systems are reviewed and negative.   Physical Exam: VS:  BP 110/69   Pulse 70   Ht  (1.778 m)   Wt 195 lb 6.4 oz (88.6 kg)   SpO2 98%   BMI 28.04 kg/m , BMI Body mass index is 28.04 kg/m.  Wt Readings from Last 3 Encounters:  11/24/19 195 lb 6.4 oz (88.6 kg)  07/20/19 190 lb (86.2 kg)  10/19/18 188 lb 9.6 oz (85.5 kg)    General: Patient appears comfortable at rest. Neck: Supple, no elevated JVP or carotid bruits, no thyromegaly. Lungs: Clear to auscultation, prolonged expiratory phase nonlabored breathing at rest. Cardiac: Regular rate and rhythm, no S3 or significant systolic murmur, no pericardial rub. Abdomen: Soft, nontender, no hepatomegaly, bowel sounds present, no guarding or rebound. Extremities: No pitting edema, distal pulses 1+. Skin: Warm and dry. Neuropsychiatric: Alert and oriented x3, affect grossly appropriate.  ECG:  An ECG dated November 24, 2019 was personally reviewed today and demonstrated:  Sinus rhythm 74 with rate variation-anterior-lateral infarct undetermined age-right axis may be secondary to infarct.  Recent Labwork: No results found for requested labs within last 8760 hours.  Labs July 14, 2019 Recent lab work from an ER visit on July 14, 2019 showed a glucose of 132, BUN of 15, creatinine 0.99.  GFR greater than 60, sodium 139, potassium 4.2, troponin T less than 0.01, hemoglobin 15.3, hematocrit 45.7, platelets 244.      Component Value Date/Time   CHOL (H) 06/13/2009 0504    230        ATP III CLASSIFICATION:  <200     mg/dL   Desirable  161-096  mg/dL   Borderline High  >=045    mg/dL   High          TRIG 409 (H) 06/13/2009 0504   HDL 23 (L) 06/13/2009 0504   CHOLHDL 10.0 06/13/2009 0504   VLDL 44 (H) 06/13/2009 0504   LDLCALC (H) 06/13/2009 0504    163        Total Cholesterol/HDL:CHD Risk Coronary Heart Disease Risk Table                      Men   Women  1/2 Average Risk   3.4   3.3  Average Risk       5.0   4.4  2 X Average Risk   9.6   7.1  3 X Average Risk  23.4   11.0        Use the calculated Patient Ratio above and the CHD Risk Table to determine the patient's CHD Risk.        ATP III CLASSIFICATION (LDL):  <100     mg/dL   Optimal  811-914  mg/dL   Near or Above  Optimal  130-159  mg/dL   Borderline  382-505  mg/dL   High  >397     mg/dL   Very High    Other Studies Reviewed Today: Carotid artery duplex study on February 02, 2019 Summary: Right Carotid: Velocities in the right ICA are consistent with a 1-39% stenosis.                The ECA appears >50% stenosed.  Left Carotid: Velocities in the left ICA are consistent with a 60-79% stenosis.               The ECA appears >50% stenosed.  Vertebrals:  Bilateral vertebral arteries demonstrate antegrade flow. Subclavians: Normal flow hemodynamics were seen in bilateral subclavian              arteries.  Echocardiogram October 28, 2017 Study Conclusions  - Left ventricle: The cavity size was mildly dilated. Wall   thickness was increased in a pattern of mild LVH. Systolic   function was severely reduced. The estimated ejection fraction   was 25%. Diffuse hypokinesis. There is akinesis of the   basal-midinferior myocardium. Features are consistent with a   pseudonormal left ventricular filling pattern, with concomitant   abnormal relaxation and increased filling pressure (grade 2   diastolic dysfunction). - Aortic valve: Mildly calcified annulus. Trileaflet. - Mitral valve: Mildly thickened leaflets . There was trivial   regurgitation. - Right atrium: Central venous pressure (est): 3 mm Hg. - Tricuspid valve: There was trivial regurgitation. - Pulmonary arteries: PA peak pressure: 25 mm Hg (S). - Pericardium, extracardiac: There was no pericardial effusion.  Impressions: - Mild LVH with mild chamber dilatation and LVEF  approximately 25%.   Diffuse hypokinesis with mid to basal inferior akinesis. Grade 2   diastolic dysfunction. Mildly thickened mitral leaflets with   trivial mitral regurgitation. Trivial tricuspid regurgitation   with PASP 25 mmHg.  Cardiac catheterization on December 18, 2016 Conclusions: 1.  Significant 2-vessel native coronary artery disease, including moderate diffuse proximal LAD disease and chronic total occlusion of mid LAD and ostial RCA.  70% proximal D1 stenosis is also present. 2.  Moderate, non-obstructive disease involving LCx.  Small branch of OM2 is chronically occluded. 3.  Widely patent LIMA->LAD. 4.  Patent SVG->rPDA with mild diffuse disease and discrete 95% stenosis at the distal anastomosis. 5.  Chronically occluded SVG->D1. 6.  Mildly elevated left and right heart filling pressures, as well as moderate pulmonary hypertension. 7.  Normal to mildly reduced cardiac output. 8.  Successful PCI to distal SVG->rPDA anastomosis using a Resolute Onxy 4.0 x 12 mm drug-eluting stent with 0% residual stenosis and TIMI-3 flow.   Assessment and Plan:  1. Coronary artery disease involving native coronary artery of native heart without angina pectoris   2. Ischemic cardiomyopathy   3. Chronic systolic heart failure (HCC)   4. Bilateral carotid artery stenosis   5. Essential hypertension   6. Claudication in peripheral vascular disease (HCC)    1. Coronary artery disease involving native coronary artery of native heart without angina pectoris Patient has both native and bypass disease. 06/2009 CABG x 3 (LIMA-LAD, SVG-diag, SVG-PDA). He presented with an anterior MI.  - from prior clinic notes, LVEF 40% (do not see echo report) at that time.  -11/2016 echo that showed LVEF 15%, diffuse hypokinesis.  - Jan 2018 cath received DES to distal SVG-rPDA. RHC with CI 2, mean PA 39, PCWP 29, LVEDP 22.  Continue aspirin 81 mg, Brilinta  90 mg p.o. twice daily, Lipitor 80 mg, Toprol-XL 200  mg daily, sublingual nitroglycerin as needed for chest pain  2. Ischemic cardiomyopathy Last echo in 2018 showed an ejection fraction of 25% with diffuse hypokinesis.  Grade 2 diastolic dysfunction.  Patient has refused ICD in the past.  3. Chronic systolic heart failure (HCC) Last ejection fraction 25% with grade 2 diastolic dysfunction.  Patient has refused ICD placement in the past.  He is on maximum dose of Entresto 97/103 mg p.o. twice daily.  Continue Lasix 20 mg daily as needed for swelling.  4. Bilateral carotid artery stenosis Last carotid study showed 1 to 39% right ICA stenosis and 60 to 79% left ICA stenosis.  ECAs showed greater than 50% stenosis.  Vertebrals demonstrated antegrade flow.  He has a pending follow-up carotid Doppler study in March 2021.  5. Essential hypertension Blood pressure within normal limits today.  Patient states he takes all of his cardiac medications as directed.  Continue therapy.  6.  Intermittent claudication Patient states he has significant bilateral buttocks pain, thigh pain, and calf pain upon ambulation.  States he cannot walk long distances without having to stop and rest.  States the pain is relieved by rest.  He attributes some of the pain due to suspected back problems.  He has never had evaluation for degenerative disc disease or arthritis.  Get an ABI with Doppler studies.  May need to start on Pletal depending on ABI results.   Highly encouraged patient to stop smoking.  Advised him this contributes to coronary artery disease, blood pressure increases, and increased plaque deposition in arteries.  Patient verbalizes understanding.  Medication Adjustments/Labs and Tests Ordered: Current medicines are reviewed at length with the patient today.  Concerns regarding medicines are outlined above.    Patient Instructions  Your physician recommends that you schedule a follow-up appointment in: 3-4 MON[facility]S [facility]  Your physician recommends  that you continue on your current medications as directed. Please refer to the Current Medication list given to you today.  Your physician has requested that you have an ankle brachial index (ABI). During this test an ultrasound and blood pressure cuff are used to evaluate the arteries that supply the arms and legs with blood. Allow thirty minutes for this exam. There are no restrictions or special instructions.  Your physician has requested that you have a lower extremity arterial duplex. During this test, ultrasound are used to evaluate arterial blood flow in the legs. Allow one hour for this exam. There are no restrictions or special instructions.  [facility] you for choosing [facility]!!          Signed, Rennis Hardingndrew Quinn, NP 11/24/2019 2:24 PM    [facility] [place] [address], [place], [place] [address] Phone: [phone]; Fax: [phone]

## 2019-11-24 NOTE — Patient Instructions (Signed)
Your physician recommends that you schedule a follow-up appointment in: 3-4 MONTHS WITH DR [facility]  Your physician recommends that you continue on your current medications as directed. Please refer to the Current Medication list given to you today.  Your physician has requested that you have an ankle brachial index (ABI). During this test an ultrasound and blood pressure cuff are used to evaluate the arteries that supply the arms and legs with blood. Allow thirty minutes for this exam. There are no restrictions or special instructions.  Your physician has requested that you have a lower extremity arterial duplex. During this test, ultrasound are used to evaluate arterial blood flow in the legs. Allow one hour for this exam. There are no restrictions or special instructions.  Thank you for choosing [facility]!!

## 2019-12-06 DIAGNOSIS — Z1339 Encounter for screening examination for other mental health and behavioral disorders: Secondary | ICD-10-CM | POA: Diagnosis not present

## 2019-12-06 DIAGNOSIS — Z7189 Other specified counseling: Secondary | ICD-10-CM | POA: Diagnosis not present

## 2019-12-06 DIAGNOSIS — Z299 Encounter for prophylactic measures, unspecified: Secondary | ICD-10-CM | POA: Diagnosis not present

## 2019-12-06 DIAGNOSIS — I429 Cardiomyopathy, unspecified: Secondary | ICD-10-CM | POA: Diagnosis not present

## 2019-12-06 DIAGNOSIS — F17210 Nicotine dependence, cigarettes, uncomplicated: Secondary | ICD-10-CM | POA: Diagnosis not present

## 2019-12-06 DIAGNOSIS — E7800 Pure hypercholesterolemia, unspecified: Secondary | ICD-10-CM | POA: Diagnosis not present

## 2019-12-06 DIAGNOSIS — Z6827 Body mass index (BMI) 27.0-27.9, adult: Secondary | ICD-10-CM | POA: Diagnosis not present

## 2019-12-06 DIAGNOSIS — Z1211 Encounter for screening for malignant neoplasm of colon: Secondary | ICD-10-CM | POA: Diagnosis not present

## 2019-12-06 DIAGNOSIS — R5383 Other fatigue: Secondary | ICD-10-CM | POA: Diagnosis not present

## 2019-12-06 DIAGNOSIS — Z1331 Encounter for screening for depression: Secondary | ICD-10-CM | POA: Diagnosis not present

## 2019-12-06 DIAGNOSIS — Z0000 Encounter for general adult medical examination without abnormal findings: Secondary | ICD-10-CM | POA: Diagnosis not present

## 2019-12-14 ENCOUNTER — Telehealth: Payer: Self-pay | Admitting: Cardiology

## 2019-12-14 NOTE — Telephone Encounter (Signed)

## 2019-12-15 ENCOUNTER — Other Ambulatory Visit: Payer: Self-pay

## 2019-12-15 ENCOUNTER — Ambulatory Visit (INDEPENDENT_AMBULATORY_CARE_PROVIDER_SITE_OTHER): Payer: Medicare Other

## 2019-12-15 DIAGNOSIS — I739 Peripheral vascular disease, unspecified: Secondary | ICD-10-CM | POA: Diagnosis not present

## 2019-12-30 MED ORDER — GLIPIZIDE 5 MG TAB
5 mg | ORAL_TABLET | ORAL | 1 refills | Status: DC
Start: 2019-12-30 — End: 2020-07-04

## 2020-01-03 ENCOUNTER — Telehealth: Payer: Self-pay | Admitting: *Deleted

## 2020-01-03 NOTE — Telephone Encounter (Signed)
-----   Message from Dave Poche, MD sent at 01/02/2020 11:54 AM EST ----- The [place] part of the leg tests show some area of calcification, but the ABIs would suggest overall blood flow is ok. If significant ongoing symptosm should get f/u with his vascular team Dr Arvilla Market MD

## 2020-01-03 NOTE — Telephone Encounter (Signed)
-----   Message from Antoine Poche, MD sent at 01/02/2020 11:52 AM EST ----- ABIs do not show any significant blockages in legs   Dominga Ferry MD

## 2020-01-03 NOTE — Telephone Encounter (Signed)
Patient informed and verbalized understanding. Patient will contact office if he decides on vascular referral. Copy sent to PCP

## 2020-02-16 ENCOUNTER — Other Ambulatory Visit: Payer: Self-pay

## 2020-02-16 ENCOUNTER — Ambulatory Visit (INDEPENDENT_AMBULATORY_CARE_PROVIDER_SITE_OTHER): Payer: Medicare Other

## 2020-02-16 DIAGNOSIS — I6523 Occlusion and stenosis of bilateral carotid arteries: Secondary | ICD-10-CM

## 2020-02-21 ENCOUNTER — Telehealth: Payer: Self-pay | Admitting: *Deleted

## 2020-02-21 NOTE — Telephone Encounter (Signed)
-----   Message from Antoine Poche, MD sent at 02/17/2020 11:53 AM EST ----- Mild to moderate blockages in the neck, we will continue to monitor  Dominga Ferry MD

## 2020-02-22 ENCOUNTER — Encounter

## 2020-02-22 ENCOUNTER — Telehealth

## 2020-02-22 MED ORDER — METFORMIN SR 500 MG 24 HR TABLET
500 mg | ORAL_TABLET | ORAL | 3 refills | Status: DC
Start: 2020-02-22 — End: 2021-02-17

## 2020-02-22 NOTE — Telephone Encounter (Addendum)
-----   Message from Jocelyn Lamer sent at 02/22/2020  2:06 PM EDT -----  Regarding: Dr. Emilee Hero Telephone  Referral    Caller (first and last name if not the patient or from practice): N/A      Caller's relationship to patient (if not from a practice): N/A      Name of caller (if calling from a practice): N/A      Name of practice: [facility]      Specialist's title, first, and last name: "Dr. Geronimo Boot"      Office Phone Number: unknown      Fax number: [phone]      Date and time of appointment: 02/27/20 at 1:20pm      Reason for appointment: Left hip issue      Details to clarify the request: N/A      Message from [facility]

## 2020-02-27 NOTE — Telephone Encounter (Signed)
Identified patient 2 identifiers verified.  Patient has an appointment with [facility] PT for evaluation and treatment of left hip today. Per verbal read back Referral to PT was originated  And printed and faxed to [facility] fax number [phone].

## 2020-03-06 DIAGNOSIS — Z299 Encounter for prophylactic measures, unspecified: Secondary | ICD-10-CM | POA: Diagnosis not present

## 2020-03-06 DIAGNOSIS — J449 Chronic obstructive pulmonary disease, unspecified: Secondary | ICD-10-CM | POA: Diagnosis not present

## 2020-03-06 DIAGNOSIS — I472 Ventricular tachycardia: Secondary | ICD-10-CM | POA: Diagnosis not present

## 2020-03-06 DIAGNOSIS — F17210 Nicotine dependence, cigarettes, uncomplicated: Secondary | ICD-10-CM | POA: Diagnosis not present

## 2020-03-06 DIAGNOSIS — I25119 Atherosclerotic heart disease of native coronary artery with unspecified angina pectoris: Secondary | ICD-10-CM | POA: Diagnosis not present

## 2020-03-06 DIAGNOSIS — I739 Peripheral vascular disease, unspecified: Secondary | ICD-10-CM | POA: Diagnosis not present

## 2020-03-08 ENCOUNTER — Ambulatory Visit (INDEPENDENT_AMBULATORY_CARE_PROVIDER_SITE_OTHER): Payer: Medicare Other | Admitting: Cardiology

## 2020-03-08 ENCOUNTER — Encounter: Payer: Self-pay | Admitting: Cardiology

## 2020-03-08 ENCOUNTER — Other Ambulatory Visit: Payer: Self-pay

## 2020-03-08 VITALS — BP 138/60 | HR 70 | Ht 70.0 in | Wt 194.0 lb

## 2020-03-08 DIAGNOSIS — I255 Ischemic cardiomyopathy: Secondary | ICD-10-CM | POA: Diagnosis not present

## 2020-03-08 DIAGNOSIS — I2510 Atherosclerotic heart disease of native coronary artery without angina pectoris: Secondary | ICD-10-CM | POA: Diagnosis not present

## 2020-03-08 DIAGNOSIS — I10 Essential (primary) hypertension: Secondary | ICD-10-CM | POA: Diagnosis not present

## 2020-03-08 DIAGNOSIS — I5022 Chronic systolic (congestive) heart failure: Secondary | ICD-10-CM

## 2020-03-08 NOTE — Progress Notes (Signed)
Clinical Summary [name] is a 64 y.o.male seen today for follow up of the following medical problems.    1. CAD/Chronic systolic HF - 02/7857 CABG x 3 (LIMA-LAD, SVG-diag, SVG-PDA). He presented with an anterior MI.  - from prior clinic notes, LVEF 40% (do not see echo report) at that time.  -11/2016 echo that showed LVEF 15%, diffuse hypokinesis.  - Jan 2018 cath as reported below, received DES to distal SVG-rPDA. RHC with CI 2, mean PA 39, PCWP 29, LVEDP 22 - 10/2017 echo LVEF 25% - he has turned down ICD - not interested in cardiac rehab.  -intervention cards recs for indefinitie brillinta.   - borderline high K previously, have not started aldactone.  - no recent chest pain. No sob or doe, no edema  -compliant wuith meds   2. SOB/COPD -follwoed by pcp  3. PAD - followed by vascular  Jan 2021 ABI  Vasc US 50-74% bilateral SFA disease, ABIs R 0.88 L 0.95  4. Carotid stenosis -01/2018 carotid US: RICA 8-50%, LICA 27-74%. - 01/2019 carotid US: RICA 1-28%, LIC 78-67% - 05/7208 carotid US: RICA 4-70%, LICA 96-28%  5. HTN  -compliant with meds   Past Medical History:  Diagnosis Date  . Anterior myocardial infarction ([place]) 06/2009   Dave [name] 12/16/2016  . Anxiety   . Anxiety disorder   . Chest pain   . Chronic brain syndrome   . Elevated blood pressure   . Tobacco abuse      No Known Allergies   Current Outpatient Medications  Medication Sig Dispense Refill  . albuterol (PROVENTIL) (2.5 MG/3ML) 0.083% nebulizer solution Inhale 2.5 mg into the lungs every 4 (four) hours as needed for wheezing or shortness of breath.   99  . aspirin EC 81 MG tablet Take 81 mg by mouth daily.    Marland Kitchen atorvastatin (LIPITOR) 80 MG tablet TAKE ONE TABLET BY MOUTH DAILY. 90 tablet 3  . furosemide (LASIX) 20 MG tablet TAKE 1 TAB DAILY AS NEEDED FOR SWELLING 90 tablet 1  . metoprolol (TOPROL-XL) 200 MG 24 hr tablet TAKE ONE TABLET BY MOUTH DAILY. 90 tablet 1  .  nitroGLYCERIN (NITROSTAT) 0.4 MG SL tablet Place 1 tablet (0.4 mg total) under the tongue every 5 (five) minutes as needed for chest pain. 25 tablet 2  . PROAIR HFA 108 (90 Base) MCG/ACT inhaler Inhale 2 puffs into the lungs 4 (four) times daily as needed for shortness of breath.     . sacubitril-valsartan (ENTRESTO) 97-103 MG Take 1 tablet by mouth 2 (two) times daily. 180 tablet 3  . ticagrelor (BRILINTA) 90 MG TABS tablet Take 1 tablet (90 mg total) by mouth 2 (two) times daily. 180 tablet 3   No current facility-administered medications for this visit.     Past Surgical History:  Procedure Laterality Date  . CARDIAC CATHETERIZATION  06/2009   Dave [name] 12/16/2016  . CARDIAC CATHETERIZATION N/A 12/18/2016   Procedure: Right/Left Heart Cath and Coronary/Graft Angiography;  Surgeon: Nelva Bush, MD;  Location: [facility];  Service: Cardiovascular;  Laterality: N/A;  . CARDIAC CATHETERIZATION N/A 12/18/2016   Procedure: Coronary Stent Intervention;  Surgeon: Nelva Bush, MD;  Location: [facility];  Service: Cardiovascular;  Laterality: N/A;  SVG-PDA  . CORONARY ANGIOPLASTY    . CORONARY ARTERY BYPASS GRAFT  2010   X3  . LAPAROSCOPIC CHOLECYSTECTOMY  2007     No Known Allergies    Family History  Problem Relation  Age of Onset  . Coronary artery disease Other      Social History Dave [name] reports that he has been smoking. He started smoking about 49 years ago. He has a 44.00 pack-year smoking history. He has never used smokeless tobacco. Dave [name] reports no history of alcohol use.   Review of Systems CONSTITUTIONAL: No weight loss, fever, chills, weakness or fatigue.  HEENT: Eyes: No visual loss, blurred vision, double vision or yellow sclerae.No hearing loss, sneezing, congestion, runny nose or sore throat.  SKIN: No rash or itching.  CARDIOVASCULAR: per hpi RESPIRATORY: No shortness of breath, cough or sputum.  GASTROINTESTINAL: No anorexia, nausea,  vomiting or diarrhea. No abdominal pain or blood.  GENITOURINARY: No burning on urination, no polyuria NEUROLOGICAL: No headache, dizziness, syncope, paralysis, ataxia, numbness or tingling in the extremities. No change in bowel or bladder control.  MUSCULOSKELETAL: No muscle, back pain, joint pain or stiffness.  LYMPHATICS: No enlarged nodes. No history of splenectomy.  PSYCHIATRIC: No history of depression or anxiety.  ENDOCRINOLOGIC: No reports of sweating, cold or heat intolerance. No polyuria or polydipsia.  Marland Kitchen   Physical Examination Today's Vitals   03/08/20 1418  BP: 138/60  Pulse: 70  SpO2: 98%  Weight: 194 lb (88 kg)  Height: 5\' 10"  (1.778 m)   Body mass index is 27.84 kg/m.  Gen: resting comfortably, no acute distress HEENT: no scleral icterus, pupils equal round and reactive, no palptable cervical adenopathy,  CV: RRR, no mr/g, no jvd Resp: Clear to auscultation bilaterally GI: abdomen is soft, non-tender, non-distended, normal bowel sounds, no hepatosplenomegaly MSK: extremities are warm, no edema.  Skin: warm, no rash Neuro:  no focal deficits Psych: appropriate affect     Assessment and Plan  1. CAD/ICM/Chronic systolic HF - no symptoms, continue current meds - request pcp labs, if K remains in normal range may try low dose aldactone at f/u  2. HTN - essentially at goal, continue current meds      , M.D.

## 2020-03-08 NOTE — Patient Instructions (Signed)
Your physician recommends that you schedule a follow-up appointment in: 4 MONTHS WITH DR BRANCH  Your physician recommends that you continue on your current medications as directed. Please refer to the Current Medication list given to you today.  Thank you for choosing [facility]!!    

## 2020-03-09 ENCOUNTER — Encounter: Payer: Self-pay | Admitting: *Deleted

## 2020-03-14 DIAGNOSIS — Z23 Encounter for immunization: Secondary | ICD-10-CM | POA: Diagnosis not present

## 2020-03-23 ENCOUNTER — Other Ambulatory Visit (HOSPITAL_COMMUNITY): Payer: Self-pay | Admitting: Cardiology

## 2020-03-23 DIAGNOSIS — I6523 Occlusion and stenosis of bilateral carotid arteries: Secondary | ICD-10-CM

## 2020-04-03 ENCOUNTER — Encounter

## 2020-04-03 NOTE — Telephone Encounter (Signed)
-----   Message from Verlin Grills sent at 04/03/2020  9:32 AM EDT -----  Regarding: Dr. Lurline Del  Medication Refill    Caller (if not patient): pt       Relationship of caller (if not patient): self       Best contact number(s): [phone]      Name of medication and dosage if known: glucose blood VI test strips (ACCU-CHEK SMARTVIEW TEST STRIP) strip     Is patient out of this medication (yes/no): yes       [facility]     Pharmacy listed in chart? (yes/no): yes   Pharmacy phone number:  [phone]        Details to clarify the request: N/A      Verlin Grills

## 2020-04-03 NOTE — Telephone Encounter (Signed)
Last visit 10/20/19, last refill 04/23/18.

## 2020-04-04 MED ORDER — ACCU-CHEK SMARTVIEW TEST STRIPS
ORAL_STRIP | 2 refills | Status: AC
Start: 2020-04-04 — End: ?

## 2020-04-05 ENCOUNTER — Other Ambulatory Visit: Payer: Self-pay | Admitting: Cardiology

## 2020-04-10 DIAGNOSIS — Z23 Encounter for immunization: Secondary | ICD-10-CM | POA: Diagnosis not present

## 2020-04-16 ENCOUNTER — Ambulatory Visit: Admit: 2020-04-16 | Payer: BLUE CROSS/BLUE SHIELD | Attending: Family Medicine | Primary: Family Medicine

## 2020-04-16 ENCOUNTER — Ambulatory Visit: Attending: Family Medicine | Primary: Family Medicine

## 2020-04-16 DIAGNOSIS — E119 Type 2 diabetes mellitus without complications: Secondary | ICD-10-CM

## 2020-04-16 MED ORDER — ALFUZOSIN SR 10 MG 24 HR TAB
10 mg | ORAL_TABLET | Freq: Every day | ORAL | 0 refills | Status: AC
Start: 2020-04-16 — End: ?

## 2020-04-16 NOTE — Progress Notes (Signed)
Patient notified of lab results via My Chart; see message.

## 2020-04-16 NOTE — Progress Notes (Signed)
CMP: Normal electrolyte levels, renal, and liver function.   CBC: Normal  A1c:Your current hgbA1c of 7.3 is higher than your last level of 6.5.  Work on following a diabetic diet and exercise.  Recheck this test: hgbA1c  in  3 months.  Continue with current  medications.    Lipid:You have a low HDL. This can improve by increasing physical activity.  PSA: Normal

## 2020-04-16 NOTE — Progress Notes (Signed)
SUBJECTIVE:   [name] is a 64 y.o. male who is here for a 6 month follow-up.    Type 2 DM: He endorses checking his blood glucose levels daily and notes they are doing well.     HTN: Pt's BP in office today is 122/79.    Prostate Cancer Screening: Pt reports visiting Dr. Blenda Mounts (urology) last month for a decreased urine flow. He was prescribed alfuzosin.    He reports gaining about 20 lbs but notes starting to get it under control. He does not need any help with nutrition. He remarks sleeping great and his energy levels are good. 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 Pt denies CP, SOB, change in bowel movements, problems urinating, or digestive issues.    He notes discomfort in his left hip. A visit to orthopedics showed that his hips are fine. He has been doing exercises with slight improvement.    PREVENTIVE:  PSA: Ordered.  Eye Exam: Overdue (2 years ago).  COVID-19: Completed (Both in 03/2020, [place] series).    At this time, he is otherwise doing well and has brought no other complaints to my attention today.  For a list of the medical issues addressed today, see the assessment and plan below.    PMH:   Past Medical History:   Diagnosis Date   ??? CAD (coronary artery disease)     heart attack 2011   ??? Chronic obstructive pulmonary disease (HCC)    ??? Hypercholesterolemia    ??? Hypertension      PSH:  has a past surgical history that includes pr cardiac surg procedure unlist and hx other surgical (02/05/2018).    All: has No Known Allergies.   MEDS:   Current Outpatient Medications   Medication Sig   ??? alfuzosin SR (UROXATRAL) 10 mg SR tablet Take 1 Tab by mouth daily. Indications: enlarged prostate with urination problem   ??? glucose blood VI test strips (Accu-Chek SmartView Test Strip) strip Test blood sugar every morning fasting.   ??? metFORMIN ER (GLUCOPHAGE XR) 500 mg tablet TAKE 1 TABLET BY MOUTH EVERY DAY   ??? glipiZIDE (GLUCOTROL) 5 mg tablet TAKE 1 TABLET BY MOUTH EVERY DAY   ???  amLODIPine (NORVASC) 5 mg tablet    ??? cyclobenzaprine (FLEXERIL) 10 mg tablet TAKE 1 TABLET BY MOUTH THREE TIMES A DAY AS NEEDED FOR MUSCLE SPASMS   ??? clopidogrel (PLAVIX) 75 mg tab Take 75 mg by mouth daily.   ??? rosuvastatin (CRESTOR) 20 mg tablet Take 1 Tab by mouth nightly.   ??? lisinopril (PRINIVIL, ZESTRIL) 10 mg tablet Take 1 Tab by mouth daily.   ??? glucose blood VI test strips (ACCU-CHEK SMARTVIEW TEST STRIP) strip by Does Not Apply route See Admin Instructions.   ??? metoprolol succinate (TOPROL-XL) 50 mg XL tablet Take 50 mg by mouth daily.   ??? omega-3 fatty acids-vitamin e 1,000 mg cap Take 1 Cap by mouth daily.   ??? aspirin delayed-release 81 mg tablet Take 81 mg by mouth daily.   ??? multivitamin (ONE A DAY) tablet Take 1 Tab by mouth daily.   ??? glucosamine-chondroitin (ARTHX) 500-400 mg cap Take 1 Cap by mouth daily.     No current facility-administered medications for this visit.        FH: family history includes Diabetes in his brother; Heart Disease in his father; Hypertension in his mother.   SH:  reports that he quit smoking about 3 years  ago. He has a 40.00 pack-year smoking history. He has never used smokeless tobacco. He reports that he does not drink alcohol or use drugs.     Review of Systems - History obtained from the patient  General ROS: no fever, chills, fatigue, body aches  Psychological ROS: no change in anxiety, depression, SI/HI  Ophthalmic ROS: no blurred vision, myopia, double vision  ENT ROS: no dysphagia, otalgia, otorrhea, rhinorrhea, post nasal drip  Respiratory ROS: no cough, shortness of breath, or wheezing  Cardiovascular ROS: no chest pain or dyspnea on exertion  Gastrointestinal ROS: no abdominal pain, change in bowel habits, or black or bloody stools  Genito-Urinary ROS: no frequency, urgency, incontinence, dysuria, hematuria  Musculoskeletal ROS: no arthralgia, myalgia  Neurological ROS: no headaches, dizziness, lightheadedness, tremors, seizures  Dermatological ROS: no rash  or lesions    OBJECTIVE:   Vitals:   Visit Vitals  BP 122/79 (BP 1 Location: Left arm, BP Patient Position: Sitting, BP Cuff Size: Large adult)   Pulse 64   Temp 98 ??F (36.7 ??C) (Temporal)   Resp 16   Ht 5\' 10"  (1.778 m)   Wt 247 lb 12.8 oz (112.4 kg)   SpO2 96%   BMI 35.56 kg/m??      Gen: Pleasant 64 y.o.  male in NAD.    HEENT: PERRLA. EOMI. OP moist and pink.    Neck: Supple.  No LAD.   HEART: RRR, No M/G/R.      LUNGS: CTAB No W/R.    ABDOMEN: S, NT, ND, BS+.     EXTREMITIES: Warm. No C/C/E.    MUSCULOSKELETAL: Normal ROM, muscle strength 5/5 all groups.    NEURO: Alert and oriented x 3.  Cranial nerves grossly intact.  No focal sensory or motor deficits noted.   SKIN: Warm. Dry. No rashes or other lesions noted.    ASSESSMENT/ PLAN: Diagnoses and all orders for this visit:    1. Controlled type 2 diabetes mellitus without complication, without long-term current use of insulin (HCC)  -     LIPID PANEL; Future  -     METABOLIC PANEL, COMPREHENSIVE; Future  -     CBC WITH AUTOMATED DIFF; Future  -     HEMOGLOBIN A1C WITH EAG; Future    2. Essential hypertension  -     METABOLIC PANEL, COMPREHENSIVE; Future  -     CBC WITH AUTOMATED DIFF; Future    3. Prostate cancer screening  -     PSA SCREENING (SCREENING); Future    Other orders  -     alfuzosin SR (UROXATRAL) 10 mg SR tablet; Take 1 Tab by mouth daily. Indications: enlarged prostate with urination problem    1. Controlled Type 2 Diabetes Mellitus without Complication DM type 2  Pt's blood sugar seems to be well controlled. I advised pt to continue current dose of metformin ER 500 mg tablet daily and glipizide 5 mg tablet daily, avoid sugars and starches, and to increase exercise when possible. Recheck lipid panel, CMP, CBC, and hemoglobin A1.     2. Essential Hypertension  I note his BP looks good today. I recommended continuing current dose of amlodipine 5 mg tablet, lisinopril 10 mg tablet daily, and metoprolol succinate 50 mg XL tablet daily, eating a low  sodium diet, and increasing exercise. Recheck CMP and CBC.    3. Prostate Cancer Screening  Recheck PSA for routine screening.    I added the alfuzosin SR 10 mg tablet as  directed to the pt's med list.    We will f/u in 3 months.      ICD-10-CM ICD-9-CM    1. Controlled type 2 diabetes mellitus without complication, without long-term current use of insulin (HCC)  E11.9 250.00 LIPID PANEL      METABOLIC PANEL, COMPREHENSIVE      CBC WITH AUTOMATED DIFF      HEMOGLOBIN A1C WITH EAG      LIPID PANEL      METABOLIC PANEL, COMPREHENSIVE      CBC WITH AUTOMATED DIFF      HEMOGLOBIN A1C WITH EAG   2. Essential hypertension  I10 401.9 METABOLIC PANEL, COMPREHENSIVE      CBC WITH AUTOMATED DIFF      METABOLIC PANEL, COMPREHENSIVE      CBC WITH AUTOMATED DIFF   3. Prostate cancer screening  Z12.5 V76.44 PSA SCREENING (SCREENING)      PSA SCREENING (SCREENING)      Follow-up and Dispositions    ?? Return in about 3 months (around 07/17/2020) for follow up diabetes.       I have reviewed the patient's medications and risks/side effects/benefits were discussed. Diagnosis(-es) explained to patient and questions answered. Literature provided where appropriate.     Written by Victoria Lim, [employer], as dictated by Andy Moye, MD.

## 2020-04-16 NOTE — Progress Notes (Signed)
Reviewed record in preparation for visit and have obtained necessary documentation.    Identified pt with two pt identifiers(name and DOB).    No chief complaint on file.      Health Maintenance Due   Topic Date Due   ??? Diabetic Foot Care  Never done   ??? Eye Exam  Never done   ??? COVID-19 Vaccine (1) Never done   ??? DTaP/Tdap/Td  (1 - Tdap) Never done   ??? Shingles Vaccine (1 of 2) Never done   ??? Cholesterol Test   03/09/2019   ??? Albumin Urine Test  06/26/2019   ??? Hemoglobin A1C    09/23/2019       Anthony [name] has a reminder for a "due or due soon" health maintenance. I have asked that he discuss this further with his primary care provider for follow-up on this health maintenance.      Coordination of Care Questionnaire:  :     1) Have you been to an emergency room, urgent care clinic since your last visit? no   Hospitalized since your last visit? no             2) Have you seen or consulted any other health care providers outside of [facility] since your last visit? yes  (Include any pap smears or colon screenings in this section.)    3) In the event something were to happen to you and you were unable to speak on your behalf, do you have an Advance Directive/ Living Will in place stating your wishes? NO

## 2020-04-16 NOTE — Progress Notes (Signed)
 Reviewed record in preparation for visit and have obtained necessary documentation.    Identified pt with two pt identifiers(name and DOB).    No chief complaint on file.      Health Maintenance Due   Topic Date Due   . Diabetic Foot Care  Never done   . Eye Exam  Never done   . COVID-19 Vaccine (1) Never done   . DTaP/Tdap/Td  (1 - Tdap) Never done   . Shingles Vaccine (1 of 2) Never done   . Cholesterol Test   03/09/2019   . Albumin Urine Test  06/26/2019   . Hemoglobin A1C    09/23/2019       Mr. Hyde has a reminder for a due or due soon health maintenance. I have asked that he discuss this further with his primary care provider for follow-up on this health maintenance.      Coordination of Care Questionnaire:  :     1) Have you been to an emergency room, urgent care clinic since your last visit? no   Hospitalized since your last visit? no             2) Have you seen or consulted any other health care providers outside of [facility] since your last visit? yes  (Include any pap smears or colon screenings in this section.)    3) In the event something were to happen to you and you were unable to speak on your behalf, do you have an Advance Directive/ Living Will in place stating your wishes? NO

## 2020-04-16 NOTE — Progress Notes (Signed)
Patient notified of results via My Chart; see message.

## 2020-04-16 NOTE — Progress Notes (Signed)
SUBJECTIVE:   Anthony [name] is a 64 y.o. male who is here for a 6 month follow-up.    Type 2 DM: He endorses checking his blood glucose levels daily and notes they are doing well.     HTN: Pt's BP in office today is 122/79.    Prostate Cancer Screening: Pt reports visiting Dr. Blenda Mounts (urology) last month for a decreased urine flow. He was prescribed alfuzosin.    He reports gaining about 20 lbs but notes starting to get it under control. He does not need any help with nutrition. He remarks sleeping great and his energy levels are good. 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 Pt denies CP, SOB, change in bowel movements, problems urinating, or digestive issues.    He notes discomfort in his left hip. A visit to orthopedics showed that his hips are fine. He has been doing exercises with slight improvement.    PREVENTIVE:  PSA: Ordered.  Eye Exam: Overdue (2 years ago).  COVID-19: Completed (Both in 03/2020, [place] series).    At this time, he is otherwise doing well and has brought no other complaints to my attention today.  For a list of the medical issues addressed today, see the assessment and plan below.    PMH:   Past Medical History:   Diagnosis Date   ??? CAD (coronary artery disease)     heart attack 2011   ??? Chronic obstructive pulmonary disease (HCC)    ??? Hypercholesterolemia    ??? Hypertension      PSH:  has a past surgical history that includes pr cardiac surg procedure unlist and hx other surgical (02/05/2018).    All: has No Known Allergies.   MEDS:   Current Outpatient Medications   Medication Sig   ??? alfuzosin SR (UROXATRAL) 10 mg SR tablet Take 1 Tab by mouth daily. Indications: enlarged prostate with urination problem   ??? glucose blood VI test strips (Accu-Chek SmartView Test Strip) strip Test blood sugar every morning fasting.   ??? metFORMIN ER (GLUCOPHAGE XR) 500 mg tablet TAKE 1 TABLET BY MOUTH EVERY DAY   ??? glipiZIDE (GLUCOTROL) 5 mg tablet TAKE 1 TABLET BY MOUTH EVERY DAY   ???  amLODIPine (NORVASC) 5 mg tablet    ??? cyclobenzaprine (FLEXERIL) 10 mg tablet TAKE 1 TABLET BY MOUTH THREE TIMES A DAY AS NEEDED FOR MUSCLE SPASMS   ??? clopidogrel (PLAVIX) 75 mg tab Take 75 mg by mouth daily.   ??? rosuvastatin (CRESTOR) 20 mg tablet Take 1 Tab by mouth nightly.   ??? lisinopril (PRINIVIL, ZESTRIL) 10 mg tablet Take 1 Tab by mouth daily.   ??? glucose blood VI test strips (ACCU-CHEK SMARTVIEW TEST STRIP) strip by Does Not Apply route See Admin Instructions.   ??? metoprolol succinate (TOPROL-XL) 50 mg XL tablet Take 50 mg by mouth daily.   ??? omega-3 fatty acids-vitamin e 1,000 mg cap Take 1 Cap by mouth daily.   ??? aspirin delayed-release 81 mg tablet Take 81 mg by mouth daily.   ??? multivitamin (ONE A DAY) tablet Take 1 Tab by mouth daily.   ??? glucosamine-chondroitin (ARTHX) 500-400 mg cap Take 1 Cap by mouth daily.     No current facility-administered medications for this visit.        FH: family history includes Diabetes in his brother; Heart Disease in his father; Hypertension in his mother.   SH:  reports that he quit smoking about 3 years  ago. He has a 40.00 pack-year smoking history. He has never used smokeless tobacco. He reports that he does not drink alcohol or use drugs.     Review of Systems - History obtained from the patient  General ROS: no fever, chills, fatigue, body aches  Psychological ROS: no change in anxiety, depression, SI/HI  Ophthalmic ROS: no blurred vision, myopia, double vision  ENT ROS: no dysphagia, otalgia, otorrhea, rhinorrhea, post nasal drip  Respiratory ROS: no cough, shortness of breath, or wheezing  Cardiovascular ROS: no chest pain or dyspnea on exertion  Gastrointestinal ROS: no abdominal pain, change in bowel habits, or black or bloody stools  Genito-Urinary ROS: no frequency, urgency, incontinence, dysuria, hematuria  Musculoskeletal ROS: no arthralgia, myalgia  Neurological ROS: no headaches, dizziness, lightheadedness, tremors, seizures  Dermatological ROS: no rash  or lesions    OBJECTIVE:   Vitals:   Visit Vitals  BP 122/79 (BP 1 Location: Left arm, BP Patient Position: Sitting, BP Cuff Size: Large adult)   Pulse 64   Temp 98 ??F (36.7 ??C) (Temporal)   Resp 16   Ht 5\' 10"  (1.778 m)   Wt 247 lb 12.8 oz (112.4 kg)   SpO2 96%   BMI 35.56 kg/m??      Gen: Pleasant 64 y.o.  male in NAD.    HEENT: PERRLA. EOMI. OP moist and pink.    Neck: Supple.  No LAD.   HEART: RRR, No M/G/R.      LUNGS: CTAB No W/R.    ABDOMEN: S, NT, ND, BS+.     EXTREMITIES: Warm. No C/C/E.    MUSCULOSKELETAL: Normal ROM, muscle strength 5/5 all groups.    NEURO: Alert and oriented x 3.  Cranial nerves grossly intact.  No focal sensory or motor deficits noted.   SKIN: Warm. Dry. No rashes or other lesions noted.    ASSESSMENT/ PLAN: Diagnoses and all orders for this visit:    1. Controlled type 2 diabetes mellitus without complication, without long-term current use of insulin (HCC)  -     LIPID PANEL; Future  -     METABOLIC PANEL, COMPREHENSIVE; Future  -     CBC WITH AUTOMATED DIFF; Future  -     HEMOGLOBIN A1C WITH EAG; Future    2. Essential hypertension  -     METABOLIC PANEL, COMPREHENSIVE; Future  -     CBC WITH AUTOMATED DIFF; Future    3. Prostate cancer screening  -     PSA SCREENING (SCREENING); Future    Other orders  -     alfuzosin SR (UROXATRAL) 10 mg SR tablet; Take 1 Tab by mouth daily. Indications: enlarged prostate with urination problem    1. Controlled Type 2 Diabetes Mellitus without Complication DM type 2  Pt's blood sugar seems to be well controlled. I advised pt to continue current dose of metformin ER 500 mg tablet daily and glipizide 5 mg tablet daily, avoid sugars and starches, and to increase exercise when possible. Recheck lipid panel, CMP, CBC, and hemoglobin A1.     2. Essential Hypertension  I note his BP looks good today. I recommended continuing current dose of amlodipine 5 mg tablet, lisinopril 10 mg tablet daily, and metoprolol succinate 50 mg XL tablet daily, eating a low  sodium diet, and increasing exercise. Recheck CMP and CBC.    3. Prostate Cancer Screening  Recheck PSA for routine screening.    I added the alfuzosin SR 10 mg tablet as  directed to the pt's med list.    We will f/u in 3 months.      ICD-10-CM ICD-9-CM    1. Controlled type 2 diabetes mellitus without complication, without long-term current use of insulin (HCC)  E11.9 250.00 LIPID PANEL      METABOLIC PANEL, COMPREHENSIVE      CBC WITH AUTOMATED DIFF      HEMOGLOBIN A1C WITH EAG      LIPID PANEL      METABOLIC PANEL, COMPREHENSIVE      CBC WITH AUTOMATED DIFF      HEMOGLOBIN A1C WITH EAG   2. Essential hypertension  I10 401.9 METABOLIC PANEL, COMPREHENSIVE      CBC WITH AUTOMATED DIFF      METABOLIC PANEL, COMPREHENSIVE      CBC WITH AUTOMATED DIFF   3. Prostate cancer screening  Z12.5 V76.44 PSA SCREENING (SCREENING)      PSA SCREENING (SCREENING)      Follow-up and Dispositions    ?? Return in about 3 months (around 07/17/2020) for follow up diabetes.       I have reviewed the patient's medications and risks/side effects/benefits were discussed. Diagnosis(-es) explained to patient and questions answered. Literature provided where appropriate.     Written by Fanny Bien, [employer], as dictated by Jackolyn Confer, MD.

## 2020-04-20 LAB — CBC WITH AUTO DIFFERENTIAL
Basophils %: 0 %
Basophils Absolute: 0 10*3/uL (ref 0.0–0.2)
Eosinophils %: 3 %
Eosinophils Absolute: 0.1 10*3/uL (ref 0.0–0.4)
Granulocyte Absolute Count: 0 10*3/uL (ref 0.0–0.1)
Hematocrit: 39.3 % (ref 37.5–51.0)
Hemoglobin: 13.4 g/dL (ref 13.0–17.7)
Immature Granulocytes: 0 %
Lymphocytes %: 39 %
Lymphocytes Absolute: 1.9 10*3/uL (ref 0.7–3.1)
MCH: 31.9 pg (ref 26.6–33.0)
MCHC: 34.1 g/dL (ref 31.5–35.7)
MCV: 94 fL (ref 79–97)
Monocytes %: 12 %
Monocytes Absolute: 0.6 10*3/uL (ref 0.1–0.9)
Neutrophils %: 46 %
Neutrophils Absolute: 2.2 10*3/uL (ref 1.4–7.0)
Platelets: 246 10*3/uL (ref 150–450)
RBC: 4.2 x10E6/uL (ref 4.14–5.80)
RDW: 12.5 % (ref 11.6–15.4)
WBC: 4.9 10*3/uL (ref 3.4–10.8)

## 2020-04-20 LAB — LIPID PANEL
Cholesterol, Total: 113 mg/dL (ref 100–199)
Cholesterol, total: 113 mg/dL (ref 100–199)
HDL Cholesterol: 27 mg/dL — ABNORMAL LOW (ref >39)
HDL: 27 mg/dL — ABNORMAL LOW (ref >39)
LDL Calculated: 71 mg/dL (ref 0–99)
LDL, calculated: 71 mg/dL (ref 0–99)
Triglyceride: 72 mg/dL (ref 0–149)
Triglycerides: 72 mg/dL (ref 0–149)
VLDL, calculated: 15 mg/dL (ref 5–40)
VLDL: 15 mg/dL (ref 5–40)

## 2020-04-20 LAB — COMPREHENSIVE METABOLIC PANEL
ALT: 31 IU/L (ref 0–44)
AST: 26 IU/L (ref 0–40)
Albumin/Globulin Ratio: 2.4 NA — ABNORMAL HIGH (ref 1.2–2.2)
Albumin: 4.7 g/dL (ref 3.8–4.8)
Alkaline Phosphatase: 46 IU/L (ref 39–117)
BUN: 15 mg/dL (ref 8–27)
Bun/Cre Ratio: 18 NA (ref 10–24)
CO2: 21 mmol/L (ref 20–29)
Calcium: 9.1 mg/dL (ref 8.6–10.2)
Chloride: 107 mmol/L — ABNORMAL HIGH (ref 96–106)
Creatinine: 0.82 mg/dL (ref 0.76–1.27)
EGFR IF NonAfrican American: 95 mL/min/{1.73_m2} (ref >59)
GFR African American: 110 mL/min/{1.73_m2} (ref >59)
Globulin, Total: 2 g/dL (ref 1.5–4.5)
Glucose: 88 mg/dL (ref 65–99)
Potassium: 4.4 mmol/L (ref 3.5–5.2)
Sodium: 139 mmol/L (ref 134–144)
Total Bilirubin: 0.3 mg/dL (ref 0.0–1.2)
Total Protein: 6.7 g/dL (ref 6.0–8.5)

## 2020-04-20 LAB — PSA SCREENING: PSA: 0.8 ng/mL (ref 0.0–4.0)

## 2020-04-20 LAB — HEMOGLOBIN A1C W/EAG
Hemoglobin A1C: 7.3 % — ABNORMAL HIGH (ref 4.8–5.6)
eAG: 163 mg/dL

## 2020-04-20 LAB — CBC WITH AUTOMATED DIFF
ABS. BASOPHILS: 0 10*3/uL (ref 0.0–0.2)
ABS. EOSINOPHILS: 0.1 10*3/uL (ref 0.0–0.4)
ABS. IMM. GRANS.: 0 10*3/uL (ref 0.0–0.1)
ABS. MONOCYTES: 0.6 10*3/uL (ref 0.1–0.9)
ABS. NEUTROPHILS: 2.2 10*3/uL (ref 1.4–7.0)
Abs Lymphocytes: 1.9 10*3/uL (ref 0.7–3.1)
BASOPHILS: 0 %
EOSINOPHILS: 3 %
HCT: 39.3 % (ref 37.5–51.0)
HGB: 13.4 g/dL (ref 13.0–17.7)
IMMATURE GRANULOCYTES: 0 %
Lymphocytes: 39 %
MCH: 31.9 pg (ref 26.6–33.0)
MCHC: 34.1 g/dL (ref 31.5–35.7)
MCV: 94 fL (ref 79–97)
MONOCYTES: 12 %
NEUTROPHILS: 46 %
PLATELET: 246 10*3/uL (ref 150–450)
RBC: 4.2 x10E6/uL (ref 4.14–5.80)
RDW: 12.5 % (ref 11.6–15.4)
WBC: 4.9 10*3/uL (ref 3.4–10.8)

## 2020-04-20 LAB — METABOLIC PANEL, COMPREHENSIVE
A-G Ratio: 2.4 — ABNORMAL HIGH (ref 1.2–2.2)
ALT (SGPT): 31 IU/L (ref 0–44)
AST (SGOT): 26 IU/L (ref 0–40)
Albumin: 4.7 g/dL (ref 3.8–4.8)
Alk. phosphatase: 46 IU/L (ref 39–117)
BUN/Creatinine ratio: 18 (ref 10–24)
BUN: 15 mg/dL (ref 8–27)
Bilirubin, total: 0.3 mg/dL (ref 0.0–1.2)
CO2: 21 mmol/L (ref 20–29)
Calcium: 9.1 mg/dL (ref 8.6–10.2)
Chloride: 107 mmol/L — ABNORMAL HIGH (ref 96–106)
Creatinine: 0.82 mg/dL (ref 0.76–1.27)
GFR est AA: 110 mL/min/{1.73_m2} (ref >59)
GFR est non-AA: 95 mL/min/{1.73_m2} (ref >59)
GLOBULIN, TOTAL: 2 g/dL (ref 1.5–4.5)
Glucose: 88 mg/dL (ref 65–99)
Potassium: 4.4 mmol/L (ref 3.5–5.2)
Protein, total: 6.7 g/dL (ref 6.0–8.5)
Sodium: 139 mmol/L (ref 134–144)

## 2020-04-20 LAB — HEMOGLOBIN A1C WITH EAG
Estimated average glucose: 163 mg/dL
Hemoglobin A1c: 7.3 % — ABNORMAL HIGH (ref 4.8–5.6)

## 2020-04-20 LAB — PSA SCREENING (SCREENING): Prostate Specific Ag: 0.8 ng/mL (ref 0.0–4.0)

## 2020-05-16 ENCOUNTER — Encounter

## 2020-05-16 NOTE — Telephone Encounter (Signed)
Telephone Encounter by Pamalee Leyden at 05/16/20 1219                Author: Pamalee Leyden  Service: --  Author Type: --       Filed: 05/16/20 1219  Encounter Date: 05/16/2020  Status: Signed          Editor: Pamalee Leyden          From: Dola Factor RobertsTo: Daine Floras, MDSent: 05/16/2020 11:58 AM EDTSubject: Prescription QuestionI would like to get a new prescription for Chantix  please.

## 2020-05-18 MED ORDER — VARENICLINE 1 MG TAB
1 mg | ORAL_TABLET | Freq: Two times a day (BID) | ORAL | 0 refills | Status: DC
Start: 2020-05-18 — End: 2020-08-14

## 2020-05-18 MED ORDER — VARENICLINE 0.5 MG (11)-1 MG (3X14) TABS IN A DOSE PACK
0.5 mg (11)- 1 mg (42) | Freq: Every day | ORAL | 0 refills | Status: DC
Start: 2020-05-18 — End: 2021-09-19

## 2020-05-18 NOTE — Telephone Encounter (Signed)
Rx sent

## 2020-06-18 ENCOUNTER — Encounter

## 2020-06-18 ENCOUNTER — Inpatient Hospital Stay: Payer: BLUE CROSS/BLUE SHIELD | Primary: Family Medicine

## 2020-06-22 ENCOUNTER — Other Ambulatory Visit: Payer: Self-pay | Admitting: Cardiology

## 2020-07-03 DIAGNOSIS — J449 Chronic obstructive pulmonary disease, unspecified: Secondary | ICD-10-CM | POA: Diagnosis not present

## 2020-07-03 DIAGNOSIS — I25119 Atherosclerotic heart disease of native coronary artery with unspecified angina pectoris: Secondary | ICD-10-CM | POA: Diagnosis not present

## 2020-07-03 DIAGNOSIS — F17210 Nicotine dependence, cigarettes, uncomplicated: Secondary | ICD-10-CM | POA: Diagnosis not present

## 2020-07-03 DIAGNOSIS — I429 Cardiomyopathy, unspecified: Secondary | ICD-10-CM | POA: Diagnosis not present

## 2020-07-03 DIAGNOSIS — Z299 Encounter for prophylactic measures, unspecified: Secondary | ICD-10-CM | POA: Diagnosis not present

## 2020-07-05 MED ORDER — GLIPIZIDE 5 MG TAB
5 mg | ORAL_TABLET | ORAL | 2 refills | Status: DC
Start: 2020-07-05 — End: 2021-09-19

## 2020-07-23 ENCOUNTER — Ambulatory Visit: Payer: Medicare Other | Admitting: Cardiology

## 2020-08-12 ENCOUNTER — Encounter

## 2020-08-14 MED ORDER — CHANTIX CONTINUING MONTH BOX 1 MG TABLET
1 mg | ORAL_TABLET | ORAL | 0 refills | Status: DC
Start: 2020-08-14 — End: 2021-09-19

## 2020-08-20 ENCOUNTER — Other Ambulatory Visit: Payer: Self-pay | Admitting: Cardiology

## 2020-09-19 ENCOUNTER — Emergency Department: Admit: 2020-09-19 | Payer: BLUE CROSS/BLUE SHIELD | Primary: Family Medicine

## 2020-09-19 ENCOUNTER — Inpatient Hospital Stay
Admit: 2020-09-19 | Discharge: 2020-09-19 | Disposition: A | Discharge status: Home / Self Care | Payer: BLUE CROSS/BLUE SHIELD | Attending: Emergency Medicine

## 2020-09-19 DIAGNOSIS — M542 Cervicalgia: Secondary | ICD-10-CM
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
## 2020-09-19 NOTE — ED Notes (Signed)
C Collar applied    1400: collar removed upon MD order

## 2020-09-19 NOTE — ED Notes (Signed)
 Patient  given copy of dc instructions.  Patient  verbalized understanding of instructions.  Patient alert and oriented and in no acute distress.  Patient discharged home ambulatory with self.
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
## 2020-09-25 ENCOUNTER — Encounter

## 2020-10-02 NOTE — Telephone Encounter (Signed)
Called, spoke with Pt.   Two pt identifiers confirmed.  Pt informed per Dr. Emilee Hero, Chantix is recalled.   Pt stated ok.   Pt verbalized understanding of information discussed w/ no further questions at this time.

## 2020-10-17 ENCOUNTER — Ambulatory Visit: Admit: 2020-10-17 | Payer: BLUE CROSS/BLUE SHIELD | Attending: Family Medicine | Primary: Family Medicine

## 2020-10-17 ENCOUNTER — Ambulatory Visit: Attending: Family Medicine | Primary: Family Medicine

## 2020-10-17 DIAGNOSIS — Z0000 Encounter for general adult medical examination without abnormal findings: Secondary | ICD-10-CM

## 2020-10-17 NOTE — Progress Notes (Signed)
Identified patient 2 identifiers verified.  Patient aware of CT results and to follow up with Cardiology , and needs to see  Vascular Surgery. Referral pended to Dr. Emilee Hero

## 2020-10-17 NOTE — Progress Notes (Signed)
Discussed with patient current guidelines for screening for lung cancer.  Current recommendations are to obtain yearly screening LDCT yearly for age 62-80, or until smoke free for 15 years.  Patient has 30 plus pack year history of cigarette smoking and currently does not smoke.  Discussed with patient risks and benefits of screening, including over-diagnosis, false positive rate, and total radiation exposure.  Patient currently exhibits no signs or symptoms suggestive of lung cancer.  Discussed with patient importance of compliance with yearly annual lung cancer screenings and willingness to undergo diagnosis and treatment if screening scan is positive.  In addition, patient was counseled regarding (remaining smoke free/total smoking cessation).

## 2020-10-17 NOTE — Addendum Note (Signed)
Addendum  Note by Reece Levy, LPN at 19/62/22 0900                Author: Reece Levy, LPN  Service: --  Author Type: Licensed Nurse       Filed: 10/29/20 1024  Encounter Date: 10/17/2020  Status: Signed          Editor: Reece Levy, LPN (Licensed Nurse)          Addended by: Reece Levy on: 10/29/2020 10:24 AM    Modules accepted: Orders
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
## 2020-10-17 NOTE — Progress Notes (Signed)
Unable to reach patient by phone.  Please contact the patient about his recent CT results.  There is no pulmonary mass or nodule.  We do note cardiomegaly and coronary artery disease.  He has known coronary artery disease with needs to continue follow-up with his cardiologist.  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  He will need to see a vascular surgeon.  Please pend a referral to vascular surgery.

## 2020-10-17 NOTE — Progress Notes (Signed)
.    A1c:Your current hgbA1c is higher than your last level 7.3.  Work on following a diabetic diet and exercise.  Recheck this test: hgbA1c  in  3 months.  Continue with current  medications.    KGU:RKYHCW electrolyte levels except for an elevation in the glucose level, normal renal and liver function

## 2020-10-18 LAB — COMPREHENSIVE METABOLIC PANEL
ALT: 20 IU/L (ref 0–44)
AST: 17 IU/L (ref 0–40)
Albumin/Globulin Ratio: 2 NA (ref 1.2–2.2)
Albumin: 4.7 g/dL (ref 3.8–4.8)
Alkaline Phosphatase: 48 IU/L (ref 44–121)
BUN: 22 mg/dL (ref 8–27)
Bun/Cre Ratio: 23 NA (ref 10–24)
CO2: 22 mmol/L (ref 20–29)
Calcium: 9.5 mg/dL (ref 8.6–10.2)
Chloride: 106 mmol/L (ref 96–106)
Creatinine: 0.95 mg/dL (ref 0.76–1.27)
EGFR IF NonAfrican American: 85 mL/min/{1.73_m2} (ref >59)
GFR African American: 99 mL/min/{1.73_m2} (ref >59)
Globulin, Total: 2.3 g/dL (ref 1.5–4.5)
Glucose: 138 mg/dL — ABNORMAL HIGH (ref 65–99)
Potassium: 4.8 mmol/L (ref 3.5–5.2)
Sodium: 140 mmol/L (ref 134–144)
Total Bilirubin: 0.3 mg/dL (ref 0.0–1.2)
Total Protein: 7 g/dL (ref 6.0–8.5)

## 2020-10-18 LAB — HEMOGLOBIN A1C W/EAG
Hemoglobin A1C: 7.9 % — ABNORMAL HIGH (ref 4.8–5.6)
eAG: 180 mg/dL

## 2020-10-18 LAB — METABOLIC PANEL, COMPREHENSIVE
A-G Ratio: 2 (ref 1.2–2.2)
ALT (SGPT): 20 IU/L (ref 0–44)
AST (SGOT): 17 IU/L (ref 0–40)
Albumin: 4.7 g/dL (ref 3.8–4.8)
Alk. phosphatase: 48 IU/L (ref 44–121)
BUN/Creatinine ratio: 23 (ref 10–24)
BUN: 22 mg/dL (ref 8–27)
Bilirubin, total: 0.3 mg/dL (ref 0.0–1.2)
CO2: 22 mmol/L (ref 20–29)
Calcium: 9.5 mg/dL (ref 8.6–10.2)
Chloride: 106 mmol/L (ref 96–106)
Creatinine: 0.95 mg/dL (ref 0.76–1.27)
GFR est AA: 99 mL/min/{1.73_m2} (ref >59)
GFR est non-AA: 85 mL/min/{1.73_m2} (ref >59)
GLOBULIN, TOTAL: 2.3 g/dL (ref 1.5–4.5)
Glucose: 138 mg/dL — ABNORMAL HIGH (ref 65–99)
Potassium: 4.8 mmol/L (ref 3.5–5.2)
Protein, total: 7 g/dL (ref 6.0–8.5)
Sodium: 140 mmol/L (ref 134–144)

## 2020-10-18 LAB — HEMOGLOBIN A1C WITH EAG
Estimated average glucose: 180 mg/dL
Hemoglobin A1c: 7.9 % — ABNORMAL HIGH (ref 4.8–5.6)

## 2020-10-23 ENCOUNTER — Inpatient Hospital Stay: Admit: 2020-10-23 | Payer: BLUE CROSS/BLUE SHIELD | Attending: Family Medicine | Primary: Family Medicine

## 2020-10-23 DIAGNOSIS — Z122 Encounter for screening for malignant neoplasm of respiratory organs: Secondary | ICD-10-CM

## 2020-10-23 DIAGNOSIS — Z0000 Encounter for general adult medical examination without abnormal findings: Secondary | ICD-10-CM

## 2020-10-23 NOTE — Progress Notes (Signed)
No additional comment

## 2020-10-25 ENCOUNTER — Telehealth

## 2020-10-29 NOTE — Telephone Encounter (Signed)
-----   Message from Daine Floras, MD sent at 10/25/2020  7:24 PM EST -----  Unable to reach patient by phone.  Please contact the patient about his recent CT results.  There is no pulmonary mass or nodule.  We do note cardiomegaly and coronary artery disease.  He has known coronary artery disease with needs to continue follow-up with his cardiologist.  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  He will need to see a vascular surgeon.  Please pend a referral to vascular surgery.

## 2020-10-31 NOTE — Telephone Encounter (Signed)
Katelyn with [facility],   Dr Rande Lawman  Phone: [phone]  Fax: [phone]  [phone]  [place] [address]       States needs CT results faxed as soon as able.

## 2020-10-31 NOTE — Telephone Encounter (Signed)
Sent fax of CT results to Dr. Judie Petit Rajab's office. Scanned fax to pt's chart

## 2020-11-01 NOTE — Telephone Encounter (Signed)
See referral

## 2020-11-05 NOTE — Telephone Encounter (Signed)
Called, spoke with Pt.   Two pt identifiers confirmed.  Pt informed of referral to a vascular surgeon.   Pt stated he's waiting for his cardiologist to call back after looking at the test as well.   Pt verbalized understanding of information discussed w/ no further questions at this time.

## 2020-11-09 ENCOUNTER — Other Ambulatory Visit: Payer: Self-pay | Admitting: Cardiology

## 2020-11-12 ENCOUNTER — Telehealth

## 2020-11-12 NOTE — Telephone Encounter (Signed)
Called, spoke with Pt.   Two pt identifiers confirmed.  Pt stated he wants to see Dr. Arcelia Jew for the aortic aneurysm he has.   Pt informed that was fine and I will update the referral.   Pt verbalized understanding of information discussed w/ no further questions at this time.

## 2020-11-12 NOTE — Telephone Encounter (Signed)
Patient states he needs a call back in reference to getting the name of a Cardiac Surgeon from Dr. Emilee Hero for him to go see. Patient states a detailed message can be left on his Voice Mail if no answer.  Please call. Thank you

## 2020-11-14 NOTE — Telephone Encounter (Signed)
Tommie with Cardiothoracic surgery, office of Dr Arcelia Jew  [phone]      States please be advised regarding dr Luciano Cutter:    States does not provide vascular surgery.  States they are cardiothoracic surgery.

## 2020-11-14 NOTE — Telephone Encounter (Signed)
Patient states he needs a call back in reference to getting his documentation/records sent over to Dr. Arcelia Jew that he was referred to for his upcoming appt. Please call if any questions or to advise when done. Thank you

## 2020-11-14 NOTE — Telephone Encounter (Signed)
Noted.

## 2020-11-14 NOTE — Telephone Encounter (Signed)
Spoke w/ pt, informed pt that everything has been faxed and confirmed fax. Pt understood nothing further needed. Scanned fax into chart.

## 2020-11-14 NOTE — Telephone Encounter (Signed)
Sending fax of CT scan results, referral, recent O/N and recent labs to DR. Hahn's office. Waiting for confirmation fax.

## 2020-11-27 DIAGNOSIS — R0789 Other chest pain: Secondary | ICD-10-CM | POA: Diagnosis not present

## 2020-11-27 DIAGNOSIS — I472 Ventricular tachycardia: Secondary | ICD-10-CM | POA: Diagnosis not present

## 2020-11-27 DIAGNOSIS — I499 Cardiac arrhythmia, unspecified: Secondary | ICD-10-CM | POA: Diagnosis not present

## 2020-11-27 DIAGNOSIS — Z720 Tobacco use: Secondary | ICD-10-CM | POA: Diagnosis not present

## 2020-11-27 DIAGNOSIS — I110 Hypertensive heart disease with heart failure: Secondary | ICD-10-CM | POA: Diagnosis not present

## 2020-11-27 DIAGNOSIS — R0602 Shortness of breath: Secondary | ICD-10-CM | POA: Diagnosis not present

## 2020-11-27 DIAGNOSIS — J209 Acute bronchitis, unspecified: Secondary | ICD-10-CM | POA: Diagnosis not present

## 2020-11-27 DIAGNOSIS — I248 Other forms of acute ischemic heart disease: Secondary | ICD-10-CM | POA: Diagnosis not present

## 2020-11-27 DIAGNOSIS — Z20822 Contact with and (suspected) exposure to covid-19: Secondary | ICD-10-CM | POA: Diagnosis not present

## 2020-11-27 DIAGNOSIS — I739 Peripheral vascular disease, unspecified: Secondary | ICD-10-CM | POA: Insufficient documentation

## 2020-11-27 DIAGNOSIS — J9602 Acute respiratory failure with hypercapnia: Secondary | ICD-10-CM | POA: Diagnosis not present

## 2020-11-27 DIAGNOSIS — I959 Hypotension, unspecified: Secondary | ICD-10-CM | POA: Diagnosis not present

## 2020-11-27 DIAGNOSIS — I5022 Chronic systolic (congestive) heart failure: Secondary | ICD-10-CM | POA: Diagnosis not present

## 2020-11-27 DIAGNOSIS — R079 Chest pain, unspecified: Secondary | ICD-10-CM | POA: Diagnosis not present

## 2020-11-27 DIAGNOSIS — R059 Cough, unspecified: Secondary | ICD-10-CM | POA: Diagnosis not present

## 2020-11-27 DIAGNOSIS — J80 Acute respiratory distress syndrome: Secondary | ICD-10-CM | POA: Diagnosis not present

## 2020-11-27 DIAGNOSIS — J441 Chronic obstructive pulmonary disease with (acute) exacerbation: Secondary | ICD-10-CM | POA: Diagnosis not present

## 2020-11-28 ENCOUNTER — Ambulatory Visit: Payer: Medicare Other | Admitting: Cardiology

## 2020-11-28 DIAGNOSIS — I739 Peripheral vascular disease, unspecified: Secondary | ICD-10-CM | POA: Diagnosis not present

## 2020-11-28 DIAGNOSIS — J209 Acute bronchitis, unspecified: Secondary | ICD-10-CM | POA: Diagnosis not present

## 2020-11-28 DIAGNOSIS — J441 Chronic obstructive pulmonary disease with (acute) exacerbation: Secondary | ICD-10-CM | POA: Diagnosis not present

## 2020-11-29 DIAGNOSIS — J441 Chronic obstructive pulmonary disease with (acute) exacerbation: Secondary | ICD-10-CM | POA: Diagnosis not present

## 2020-11-29 DIAGNOSIS — J209 Acute bronchitis, unspecified: Secondary | ICD-10-CM | POA: Diagnosis not present

## 2020-11-29 DIAGNOSIS — I739 Peripheral vascular disease, unspecified: Secondary | ICD-10-CM | POA: Diagnosis not present

## 2020-11-30 DIAGNOSIS — R000 Tachycardia, unspecified: Secondary | ICD-10-CM | POA: Diagnosis not present

## 2020-11-30 DIAGNOSIS — R062 Wheezing: Secondary | ICD-10-CM | POA: Diagnosis not present

## 2020-11-30 DIAGNOSIS — I252 Old myocardial infarction: Secondary | ICD-10-CM | POA: Diagnosis not present

## 2020-11-30 DIAGNOSIS — I10 Essential (primary) hypertension: Secondary | ICD-10-CM | POA: Diagnosis not present

## 2020-11-30 DIAGNOSIS — I5020 Unspecified systolic (congestive) heart failure: Secondary | ICD-10-CM | POA: Diagnosis not present

## 2020-11-30 DIAGNOSIS — Z20822 Contact with and (suspected) exposure to covid-19: Secondary | ICD-10-CM | POA: Diagnosis not present

## 2020-11-30 DIAGNOSIS — I5022 Chronic systolic (congestive) heart failure: Secondary | ICD-10-CM | POA: Diagnosis present

## 2020-11-30 DIAGNOSIS — J9602 Acute respiratory failure with hypercapnia: Secondary | ICD-10-CM | POA: Diagnosis present

## 2020-11-30 DIAGNOSIS — I248 Other forms of acute ischemic heart disease: Secondary | ICD-10-CM | POA: Diagnosis present

## 2020-11-30 DIAGNOSIS — J209 Acute bronchitis, unspecified: Secondary | ICD-10-CM | POA: Diagnosis not present

## 2020-11-30 DIAGNOSIS — R0602 Shortness of breath: Secondary | ICD-10-CM | POA: Diagnosis not present

## 2020-11-30 DIAGNOSIS — Z720 Tobacco use: Secondary | ICD-10-CM | POA: Diagnosis not present

## 2020-11-30 DIAGNOSIS — E785 Hyperlipidemia, unspecified: Secondary | ICD-10-CM | POA: Diagnosis present

## 2020-11-30 DIAGNOSIS — Z7902 Long term (current) use of antithrombotics/antiplatelets: Secondary | ICD-10-CM | POA: Diagnosis not present

## 2020-11-30 DIAGNOSIS — R0603 Acute respiratory distress: Secondary | ICD-10-CM | POA: Diagnosis present

## 2020-11-30 DIAGNOSIS — R7989 Other specified abnormal findings of blood chemistry: Secondary | ICD-10-CM | POA: Diagnosis not present

## 2020-11-30 DIAGNOSIS — Z7982 Long term (current) use of aspirin: Secondary | ICD-10-CM | POA: Diagnosis not present

## 2020-11-30 DIAGNOSIS — J441 Chronic obstructive pulmonary disease with (acute) exacerbation: Secondary | ICD-10-CM | POA: Diagnosis not present

## 2020-11-30 DIAGNOSIS — R0902 Hypoxemia: Secondary | ICD-10-CM | POA: Diagnosis not present

## 2020-11-30 DIAGNOSIS — I472 Ventricular tachycardia: Secondary | ICD-10-CM | POA: Diagnosis not present

## 2020-11-30 DIAGNOSIS — I493 Ventricular premature depolarization: Secondary | ICD-10-CM | POA: Diagnosis present

## 2020-11-30 DIAGNOSIS — Z951 Presence of aortocoronary bypass graft: Secondary | ICD-10-CM | POA: Diagnosis not present

## 2020-11-30 DIAGNOSIS — F17200 Nicotine dependence, unspecified, uncomplicated: Secondary | ICD-10-CM | POA: Diagnosis present

## 2020-11-30 DIAGNOSIS — I110 Hypertensive heart disease with heart failure: Secondary | ICD-10-CM | POA: Diagnosis present

## 2020-12-01 DIAGNOSIS — J441 Chronic obstructive pulmonary disease with (acute) exacerbation: Secondary | ICD-10-CM | POA: Diagnosis not present

## 2020-12-01 DIAGNOSIS — R7989 Other specified abnormal findings of blood chemistry: Secondary | ICD-10-CM | POA: Diagnosis not present

## 2020-12-03 DIAGNOSIS — I10 Essential (primary) hypertension: Secondary | ICD-10-CM | POA: Diagnosis not present

## 2020-12-03 DIAGNOSIS — J209 Acute bronchitis, unspecified: Secondary | ICD-10-CM | POA: Diagnosis not present

## 2020-12-03 DIAGNOSIS — J441 Chronic obstructive pulmonary disease with (acute) exacerbation: Secondary | ICD-10-CM | POA: Diagnosis not present

## 2020-12-03 DIAGNOSIS — R0603 Acute respiratory distress: Secondary | ICD-10-CM | POA: Diagnosis not present

## 2020-12-04 DIAGNOSIS — J209 Acute bronchitis, unspecified: Secondary | ICD-10-CM | POA: Diagnosis not present

## 2020-12-04 DIAGNOSIS — J441 Chronic obstructive pulmonary disease with (acute) exacerbation: Secondary | ICD-10-CM | POA: Diagnosis not present

## 2020-12-04 DIAGNOSIS — R0603 Acute respiratory distress: Secondary | ICD-10-CM | POA: Diagnosis not present

## 2020-12-04 DIAGNOSIS — I10 Essential (primary) hypertension: Secondary | ICD-10-CM | POA: Diagnosis not present

## 2020-12-06 ENCOUNTER — Emergency Department: Admit: 2020-12-06 | Payer: BLUE CROSS/BLUE SHIELD | Primary: Family Medicine

## 2020-12-06 ENCOUNTER — Inpatient Hospital Stay
Admit: 2020-12-06 | Discharge: 2020-12-07 | Disposition: A | Discharge status: Home / Self Care | Payer: BLUE CROSS/BLUE SHIELD | Attending: Emergency Medicine

## 2020-12-06 DIAGNOSIS — U071 COVID-19: Secondary | ICD-10-CM

## 2020-12-06 LAB — INFLUENZA A+B VIRAL AGS
Flu A Antigen: NEGATIVE
Influenza A Antigen: NEGATIVE
Influenza B Antigen: NEGATIVE
Influenza B Antigen: NEGATIVE

## 2020-12-06 LAB — COVID-19, RAPID: SARS-CoV-2, Rapid: DETECTED — AB

## 2020-12-06 LAB — COVID-19

## 2020-12-06 LAB — COVID-19 RAPID TEST: COVID-19 rapid test: DETECTED — AB

## 2020-12-06 LAB — SARS-COV-2

## 2020-12-06 MED ORDER — ACETAMINOPHEN 325 MG TABLET
325 mg | ORAL | Status: AC
Start: 2020-12-06 — End: 2020-12-06
  Administered 2020-12-06: 23:00:00 via ORAL

## 2020-12-06 MED ORDER — IBUPROFEN 600 MG TAB
600 mg | ORAL | Status: AC
Start: 2020-12-06 — End: 2020-12-06
  Administered 2020-12-06: 23:00:00 via ORAL

## 2020-12-06 MED FILL — ACETAMINOPHEN 325 MG TABLET: 325 mg | ORAL | Qty: 3

## 2020-12-06 MED FILL — IBUPROFEN 600 MG TAB: 600 mg | ORAL | Qty: 1

## 2020-12-06 NOTE — ED Provider Notes (Signed)
64 year old male with CAD, COPD and hypertension presents with fever and body aches.  He is concerned he may have Covid despite being triple vaccinated.  He denies chest pain or shortness of breath.           Past Medical History:   Diagnosis Date   ??? CAD (coronary artery disease)     heart attack 2011   ??? Chronic obstructive pulmonary disease (HCC)    ??? Hypercholesterolemia    ??? Hypertension        Past Surgical History:   Procedure Laterality Date   ??? HX OTHER SURGICAL  02/05/2018    stent in heart    ??? PR CARDIAC SURG PROCEDURE UNLIST      heart stint placement 2011         Family History:   Problem Relation Age of Onset   ??? Hypertension Mother    ??? Heart Disease Father    ??? Diabetes Brother        Social History     Socioeconomic History   ??? Marital status: MARRIED     Spouse name: Not on file   ??? Number of children: Not on file   ??? Years of education: Not on file   ??? Highest education level: Not on file   Occupational History   ??? Not on file   Tobacco Use   ??? Smoking status: Former Smoker     Packs/day: 2.00     Years: 40.00     Pack years: 80.00     Types: Cigarettes     Quit date: 02/16/2017     Years since quitting: 3.8   ??? Smokeless tobacco: Never Used   Substance and Sexual Activity   ??? Alcohol use: No   ??? Drug use: No     Comment: History of cocaine use   ??? Sexual activity: Not Currently     Partners: Female   Other Topics Concern   ??? Not on file   Social History Narrative   ??? Not on file     Social Determinants of Health     Financial Resource Strain:    ??? Difficulty of Paying Living Expenses: Not on file   Food Insecurity:    ??? Worried About Running Out of Food in the Last Year: Not on file   ??? Ran Out of Food in the Last Year: Not on file   Transportation Needs:    ??? Lack of Transportation (Medical): Not on file   ??? Lack of Transportation (Non-Medical): Not on file   Physical Activity:    ??? Days of Exercise per Week: Not on file   ??? Minutes of Exercise per Session: Not on file   Stress:    ??? Feeling of  Stress : Not on file   Social Connections:    ??? Frequency of Communication with Friends and Family: Not on file   ??? Frequency of Social Gatherings with Friends and Family: Not on file   ??? Attends Religious Services: Not on file   ??? Active Member of Clubs or Organizations: Not on file   ??? Attends Banker Meetings: Not on file   ??? Marital Status: Not on file   Intimate Partner Violence:    ??? Fear of Current or Ex-Partner: Not on file   ??? Emotionally Abused: Not on file   ??? Physically Abused: Not on file   ??? Sexually Abused: Not on file   Housing Stability:    ???  Unable to Pay for Housing in the Last Year: Not on file   ??? Number of Places Lived in the Last Year: Not on file   ??? Unstable Housing in the Last Year: Not on file         ALLERGIES: Patient has no known allergies.    Review of Systems   Constitutional: Positive for fever.   HENT: Negative for rhinorrhea.    Respiratory: Negative for cough.    Cardiovascular: Negative for chest pain.   Gastrointestinal: Negative for abdominal pain.   Genitourinary: Negative for dysuria.   Musculoskeletal: Negative for back pain.   Skin: Negative for wound.   Neurological: Negative for headaches.   Psychiatric/Behavioral: Negative for confusion.       Vitals:    12/06/20 1637   BP: 132/73   Pulse: (!) 101   Resp: 18   Temp: (!) 102.6 ??F (39.2 ??C)   SpO2: 96%   Weight: 102.1 kg (225 lb)            Physical Exam  Vitals and nursing note reviewed.   Constitutional:       General: He is not in acute distress.     Appearance: Normal appearance. He is not ill-appearing, toxic-appearing or diaphoretic.   HENT:      Head: Normocephalic.   Eyes:      Extraocular Movements: Extraocular movements intact.   Cardiovascular:      Rate and Rhythm: Normal rate.      Pulses: Normal pulses.   Pulmonary:      Effort: Pulmonary effort is normal. No respiratory distress.   Abdominal:      General: There is no distension.   Musculoskeletal:         General: Normal range of motion.       Cervical back: Normal range of motion.   Skin:     General: Skin is dry.      Capillary Refill: Capillary refill takes less than 2 seconds.   Neurological:      Mental Status: He is alert and oriented to person, place, and time.   Psychiatric:         Mood and Affect: Mood normal.          MDM  Number of Diagnoses or Management Options  COVID  Diagnosis management comments: Covid test pending.  Work of breathing and oxygen saturations normal.  Discussed my clinical impression(s), any labs and/or radiology results with the patient. I answered any questions and addressed any concerns. Discussed the importance of following up with their primary care physician and/or specialist(s). Discussed signs or symptoms that would warrant return back to the ER for further evaluation. The patient is agreeable with discharge.         Procedures

## 2020-12-06 NOTE — ED Notes (Signed)
Triage: pt c/o body aches, headache, nasal congestion, nonproductive cough and fever starting this AM. Pt here for COVID test. Pt did not take any Tylenol or Motrin since 0630.

## 2020-12-06 NOTE — ED Notes (Signed)
Pt ambulatory out of ED with discharge instructions in hand given by Dr. Dumas; pt verbalized understanding of discharge paperwork and time allotted for questions. VSS. Pt alert and oriented.

## 2020-12-06 NOTE — Progress Notes (Signed)
I called and spoke with patient concerning covid results.  Discussed self quarantine, questions answered, reviewed reasons/signs/symptoms for return to ER.

## 2020-12-08 LAB — COVID-19: SARS-CoV-2: DETECTED — AB

## 2020-12-08 LAB — SARS-COV-2: SARS-CoV-2: DETECTED — AB

## 2020-12-10 NOTE — Progress Notes (Signed)
Patient contacted regarding COVID-19 diagnosis. SPT ED 12/06/20 dx-covid (body aches)    Discussed COVID-19 related testing which was available at this time. Test results were positive. Patient informed of results, if available? yes.     Pt stated he is doing fine, Thursday night was bad, but he now has no symptoms of covid. Pt will follow up with PCP as needed. Pt is fully covid vaccinated.    Ambulatory Care Manager contacted the patient by telephone to perform post discharge assessment. Call within 2 business days of discharge: Yes Verified name and DOB with patient as identifiers. Provided introduction to self, and explanation of the CTN/ACM role, and reason for call due to risk factors for infection and/or exposure to COVID-19.     Symptoms reviewed with patient who verbalized the following symptoms: symptoms resolved      Due to no new or worsening symptoms encounter was not routed to provider for escalation. Discussed follow-up appointments. If no appointment was previously scheduled, appointment scheduling offered:  yes.  [facility] follow up appointment(s): No future appointments.  Non-[facility] follow up appointment(s): unknown    Interventions to address risk factors: Obtained and reviewed discharge summary and/or continuity of care documents     Advance Care Planning:   Does patient have an Advance Directive: not on file; education provided.     Educated patient about risk for severe COVID-19 due to risk factors according to CDC guidelines. ACM reviewed discharge instructions, medical action plan and red flag symptoms with the patient who verbalized understanding. Discussed COVID vaccination status: yes. Education provided on COVID-19 vaccination as appropriate. Discussed exposure protocols and quarantine with CDC Guidelines. Patient was given an opportunity to verbalize any questions and concerns and agrees to contact ACM or health care provider for questions related to their healthcare.    Reviewed and educated  patient on any new and changed medications related to discharge diagnosis     Was patient discharged with a pulse oximeter? no Discussed and confirmed pulse oximeter discharge instructions and when to notify provider or seek emergency care.    ACM provided contact information. Plan for follow-up call in 5-7 days based on severity of symptoms and risk factors.

## 2020-12-10 NOTE — ACP (Advance Care Planning) (Signed)
Advance Care Planning:   Does patient have an Advance Directive: not on file; education provided. Pt stated his brother Iantha Fallen, who is a doctor, is his primary SDM and wife is secondary. Educated pt on completing AMD via My Chart. Understanding was verbalized.        Primary Decision Maker: Anthony [name], Anthony [name] - Brother - [phone]    Secondary Decision Maker: Anthony [name],Anthony [name] - Spouse - [phone]  Advance Care Planning 12/10/2020   Confirm Advance Directive None

## 2020-12-11 DIAGNOSIS — J449 Chronic obstructive pulmonary disease, unspecified: Secondary | ICD-10-CM | POA: Diagnosis not present

## 2020-12-11 DIAGNOSIS — F17210 Nicotine dependence, cigarettes, uncomplicated: Secondary | ICD-10-CM | POA: Diagnosis not present

## 2020-12-11 DIAGNOSIS — Z09 Encounter for follow-up examination after completed treatment for conditions other than malignant neoplasm: Secondary | ICD-10-CM | POA: Diagnosis not present

## 2020-12-11 DIAGNOSIS — J9611 Chronic respiratory failure with hypoxia: Secondary | ICD-10-CM | POA: Diagnosis not present

## 2020-12-11 DIAGNOSIS — Z299 Encounter for prophylactic measures, unspecified: Secondary | ICD-10-CM | POA: Diagnosis not present

## 2020-12-11 DIAGNOSIS — I25119 Atherosclerotic heart disease of native coronary artery with unspecified angina pectoris: Secondary | ICD-10-CM | POA: Diagnosis not present

## 2020-12-19 NOTE — Progress Notes (Signed)
Patient resolved from COVID Care Transitions episode on 12/19/20.  Discussed COVID-19 related testing which was available at this time. Test results were positive. Patient informed of results, if available? yes     Patient/family has been provided the following resources and education related to COVID-19:                         Signs, symptoms and red flags related to COVID-19            CDC exposure and quarantine guidelines            [facility] exposure contact - [phone]            Contact for their local Department of Health               Patient currently reports that the following symptoms have improved:  no new symptoms and no worsening symptoms.    No further outreach scheduled with this CTN/ACM/LPN/HC/ MA.  Episode of Care resolved.  Patient has this CTN/ACM/LPN/HC/MA contact information if future needs arise.

## 2021-01-08 NOTE — Progress Notes (Signed)
1/31 recd letter from Freida Busman and [place] faxed to [facility] on 2/1   SO

## 2021-01-15 ENCOUNTER — Encounter: Payer: BLUE CROSS/BLUE SHIELD | Primary: Family Medicine

## 2021-01-21 NOTE — Telephone Encounter (Signed)
Spoke w/ pt, scheduled VV for 2/17 w/ Dr. Emilee Hero
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
## 2021-01-23 ENCOUNTER — Encounter: Attending: Family Medicine | Primary: Family Medicine

## 2021-01-24 ENCOUNTER — Telehealth: Admit: 2021-01-24 | Payer: BLUE CROSS/BLUE SHIELD | Attending: Family Medicine | Primary: Family Medicine

## 2021-01-24 ENCOUNTER — Telehealth: Attending: Family Medicine | Primary: Family Medicine

## 2021-01-24 DIAGNOSIS — S6992XD Unspecified injury of left wrist, hand and finger(s), subsequent encounter: Secondary | ICD-10-CM

## 2021-01-24 NOTE — Progress Notes (Signed)
 Identified pt with two pt identifiers(name and DOB). Reviewed record in preparation for visit and have obtained necessary documentation.  Chief Complaint   Patient presents with   . Follow-up     injury last October still having issues with groin pain and hand pain.         There were no vitals filed for this visit.    Health Maintenance Due   Topic   . Foot Exam Q1    . Eye Exam Retinal or Dilated    . DTaP/Tdap/Td series (1 - Tdap)   . Shingrix Vaccine Age 51> (1 of 2)   . MICROALBUMIN Q1    . Flu Vaccine (1)       Coordination of Care Questionnaire:  :   1) Have you been to an emergency room, urgent care, or hospitalized since your last visit?  If yes, where when, and reason for visit? no       2. Have seen or consulted any other health care provider since your last visit?   If yes, where when, and reason for visit?  NO      An electronic signature was used to authenticate this note.  -- Dorthy Glatter, LPN
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
## 2021-02-11 ENCOUNTER — Other Ambulatory Visit: Payer: Self-pay | Admitting: Cardiology

## 2021-02-17 ENCOUNTER — Encounter

## 2021-02-17 MED ORDER — METFORMIN SR 500 MG 24 HR TABLET
500 mg | ORAL_TABLET | ORAL | 3 refills | Status: AC
Start: 2021-02-17 — End: ?

## 2021-04-01 ENCOUNTER — Other Ambulatory Visit: Payer: Self-pay | Admitting: Cardiology

## 2021-04-03 DIAGNOSIS — Z6826 Body mass index (BMI) 26.0-26.9, adult: Secondary | ICD-10-CM | POA: Diagnosis not present

## 2021-04-03 DIAGNOSIS — F17210 Nicotine dependence, cigarettes, uncomplicated: Secondary | ICD-10-CM | POA: Diagnosis not present

## 2021-04-03 DIAGNOSIS — Z7189 Other specified counseling: Secondary | ICD-10-CM | POA: Diagnosis not present

## 2021-04-03 DIAGNOSIS — Z125 Encounter for screening for malignant neoplasm of prostate: Secondary | ICD-10-CM | POA: Diagnosis not present

## 2021-04-03 DIAGNOSIS — Z299 Encounter for prophylactic measures, unspecified: Secondary | ICD-10-CM | POA: Diagnosis not present

## 2021-04-03 DIAGNOSIS — Z1331 Encounter for screening for depression: Secondary | ICD-10-CM | POA: Diagnosis not present

## 2021-04-03 DIAGNOSIS — E7800 Pure hypercholesterolemia, unspecified: Secondary | ICD-10-CM | POA: Diagnosis not present

## 2021-04-03 DIAGNOSIS — Z79899 Other long term (current) drug therapy: Secondary | ICD-10-CM | POA: Diagnosis not present

## 2021-04-03 DIAGNOSIS — R5383 Other fatigue: Secondary | ICD-10-CM | POA: Diagnosis not present

## 2021-04-03 DIAGNOSIS — Z0000 Encounter for general adult medical examination without abnormal findings: Secondary | ICD-10-CM | POA: Diagnosis not present

## 2021-04-03 DIAGNOSIS — Z1339 Encounter for screening examination for other mental health and behavioral disorders: Secondary | ICD-10-CM | POA: Diagnosis not present

## 2021-04-05 DIAGNOSIS — F17200 Nicotine dependence, unspecified, uncomplicated: Secondary | ICD-10-CM | POA: Diagnosis not present

## 2021-04-05 DIAGNOSIS — R0789 Other chest pain: Secondary | ICD-10-CM | POA: Diagnosis not present

## 2021-04-05 DIAGNOSIS — J441 Chronic obstructive pulmonary disease with (acute) exacerbation: Secondary | ICD-10-CM | POA: Diagnosis not present

## 2021-04-05 DIAGNOSIS — R9431 Abnormal electrocardiogram [ECG] [EKG]: Secondary | ICD-10-CM | POA: Diagnosis not present

## 2021-04-05 DIAGNOSIS — I252 Old myocardial infarction: Secondary | ICD-10-CM | POA: Diagnosis not present

## 2021-04-05 DIAGNOSIS — R0602 Shortness of breath: Secondary | ICD-10-CM | POA: Diagnosis not present

## 2021-04-05 DIAGNOSIS — R079 Chest pain, unspecified: Secondary | ICD-10-CM | POA: Diagnosis not present

## 2021-04-05 DIAGNOSIS — Z9981 Dependence on supplemental oxygen: Secondary | ICD-10-CM | POA: Diagnosis not present

## 2021-04-05 DIAGNOSIS — I10 Essential (primary) hypertension: Secondary | ICD-10-CM | POA: Diagnosis not present

## 2021-04-05 DIAGNOSIS — Z20822 Contact with and (suspected) exposure to covid-19: Secondary | ICD-10-CM | POA: Diagnosis not present

## 2021-04-05 DIAGNOSIS — J449 Chronic obstructive pulmonary disease, unspecified: Secondary | ICD-10-CM | POA: Diagnosis not present

## 2021-04-05 DIAGNOSIS — Z720 Tobacco use: Secondary | ICD-10-CM | POA: Diagnosis not present

## 2021-04-05 DIAGNOSIS — E785 Hyperlipidemia, unspecified: Secondary | ICD-10-CM | POA: Diagnosis not present

## 2021-04-18 ENCOUNTER — Encounter: Payer: Self-pay | Admitting: *Deleted

## 2021-04-18 DIAGNOSIS — Z299 Encounter for prophylactic measures, unspecified: Secondary | ICD-10-CM | POA: Diagnosis not present

## 2021-04-18 DIAGNOSIS — J449 Chronic obstructive pulmonary disease, unspecified: Secondary | ICD-10-CM | POA: Diagnosis not present

## 2021-04-18 DIAGNOSIS — F17210 Nicotine dependence, cigarettes, uncomplicated: Secondary | ICD-10-CM | POA: Diagnosis not present

## 2021-04-18 NOTE — Progress Notes (Signed)
Cardiology Office Note  Date: 04/18/2021   ID: Dave [name], DOB 1956/10/25, MRN [id number]  PCP:  Ignatius Specking, MD  Cardiologist:  [name], MD Electrophysiologist:  None   Chief Complaint: Hospital follow-up  History of Present Illness: Dave [name] is a 65 y.o. male with a history of CAD (CABG x3: LIMA-LAD, SVG-diagonal, SVG-PDA), MI, chronic systolic heart failure, elevated blood pressure, tobacco abuse, SOB/COPD, PAD, carotid stenosis, HTN  Last seen by Dr. Wyline Mood 03/08/2020. Cardiac catheterization 12/2016 : DES to distal SVG-rPDA. RHC : CI 2, Mean PA 39, PCWP 29, LVEDP 22. Echocardiogram 2018 EF 25%. Previously refused ICD. Not interested in cardiac rehab. Was compliant with medications. COPD was followed by PCP, PAD was followed by vascular. He was compliant with BP medications. No symptoms indicative of CAD or CHF. Labs were requested from PCP. If K remained in normal range may try low dose aldactone at follow up  Recent presentation to [facility] ED 04/05/2021.  Presented with shortness of breath, productive cough, right-sided chest pain, history of COPD.  Symptoms ongoing for 6 days.  He had been hospitalized for similar symptoms in the past.  He was continuing to use his oxygen and medications at home but nothing was helping. CTA chest showed moderate severity emphysemtous changes, ProBNP was 601, Troponins 1, 24, 25. He refused admssion. He requested medication for right sided pain and steroids, He was advised to stop smoking. He was given steroids and Z Pak.    He is here for 13-month follow-up and recently saw his PCP and had lab work.  Had a presentation to ED as noted above to [facility] for apparent COPD exacerbation.  He apparently refused to be hospitalized in spite of being encouraged to do so.  He was given steroids and azithromycin.  He states he feels much better now.  He continues with chronic dyspnea which he attributes to greater than 50-year history  of smoking and continues to smoke.  Blood pressure is on the low side but he denies any shortness of breath, dizziness, lightheadedness, presyncopal or syncopal symptoms.  No palpitations or arrhythmias.  No CVA or TIA-like symptoms, PND, orthopnea.  He states he needs a refill on his Lipitor.  He states he is compliant with all of his medications including aspirin 81 mg daily, atorvastatin 80 mg daily, Brilinta 90 mg p.o. twice daily, Entresto 97/103 mg p.o. twice daily, Lasix as needed 20 mg, Toprol-XL 200 mg daily, nitroglycerin sublingual.  Past Medical History:  Diagnosis Date  . Anterior myocardial infarction (HCC) 06/2009   [name] 12/16/2016  . Anxiety   . Anxiety disorder   . Chest pain   . Chronic brain syndrome   . Elevated blood pressure   . Tobacco abuse     Past Surgical History:  Procedure Laterality Date  . CARDIAC CATHETERIZATION  06/2009   [name] 12/16/2016  . CARDIAC CATHETERIZATION N/A 12/18/2016   Procedure: Right/Left Heart Cath and Coronary/Graft Angiography;  Surgeon: Yvonne Kendall, MD;  Location: [facility] INVASIVE CV LAB;  Service: Cardiovascular;  Laterality: N/A;  . CARDIAC CATHETERIZATION N/A 12/18/2016   Procedure: Coronary Stent Intervention;  Surgeon: Yvonne Kendall, MD;  Location: [facility] INVASIVE CV LAB;  Service: Cardiovascular;  Laterality: N/A;  SVG-PDA  . CORONARY ANGIOPLASTY    . CORONARY ARTERY BYPASS GRAFT  2010   X3  . LAPAROSCOPIC CHOLECYSTECTOMY  2007    Current Outpatient Medications  Medication Sig Dispense Refill  . BRILINTA 90 MG TABS  tablet TAKE ONE TABLET BY MOUTH TWICE DAILY. 180 tablet 3  . albuterol (PROVENTIL) (2.5 MG/3ML) 0.083% nebulizer solution Inhale 2.5 mg into the lungs every 4 (four) hours as needed for wheezing or shortness of breath.   99  . aspirin EC 81 MG tablet Take 81 mg by mouth daily.    Marland Kitchen atorvastatin (LIPITOR) 80 MG tablet TAKE ONE TABLET BY MOUTH DAILY 7 tablet 0  . ENTRESTO 97-103 MG TAKE ONE TABLET BY MOUTH TWICE DAILY. 180  tablet 3  . furosemide (LASIX) 20 MG tablet TAKE 1 TAB DAILY AS NEEDED FOR SWELLING 90 tablet 1  . metoprolol (TOPROL-XL) 200 MG 24 hr tablet TAKE ONE TABLET BY MOUTH DAILY 90 tablet 1  . nitroGLYCERIN (NITROSTAT) 0.4 MG SL tablet Place 1 tablet (0.4 mg total) under the tongue every 5 (five) minutes as needed for chest pain. 25 tablet 2  . PROAIR HFA 108 (90 Base) MCG/ACT inhaler Inhale 2 puffs into the lungs 4 (four) times daily as needed for shortness of breath.      No current facility-administered medications for this visit.   Allergies:  Patient has no known allergies.   Social History: The patient  reports that he has been smoking. He started smoking about 50 years ago. He has a 44.00 pack-year smoking history. He has never used smokeless tobacco. He reports current drug use. Drug: Marijuana. He reports that he does not drink alcohol.   Family History: The patient's family history includes Coronary artery disease in an other family member.   ROS:  Please see the history of present illness. Otherwise, complete review of systems is positive for none.  All other systems are reviewed and negative.   Physical Exam: VS:  There were no vitals taken for this visit., BMI There is no height or weight on file to calculate BMI.  Wt Readings from Last 3 Encounters:  03/08/20 194 lb (88 kg)  11/24/19 195 lb 6.4 oz (88.6 kg)  07/20/19 190 lb (86.2 kg)    General: Patient appears comfortable at rest. Neck: Supple, no elevated JVP or carotid bruits, no thyromegaly. Lungs: Mild expiratory wheeze in in right lung.  Prolonged expiratory phase., nonlabored breathing at rest. Cardiac: Regular rate and rhythm, no S3 or significant systolic murmur, no pericardial rub. Extremities: No pitting edema, distal pulses 2+. Skin: Warm and dry. Musculoskeletal: No kyphosis. Neuropsychiatric: Alert and oriented x3, affect grossly appropriate.  ECG:  EKG April 05, 2021 at [facility] emergency department  normal sinus rhythm with intermediate axis rate of 77, minimal voltage criteria for LVH.  Anteroseptal infarct cited on or before November 27, 2020.  ST and T wave abnormality consider inferior ischemia.  Recent Labwork: No results found for requested labs within last 8760 hours.     Component Value Date/Time   CHOL (H) 06/13/2009 0504    230        ATP III CLASSIFICATION:  <200     mg/dL   Desirable  482-500  mg/dL   Borderline High  >=370    mg/dL   High          TRIG 488 (H) 06/13/2009 0504   HDL 23 (L) 06/13/2009 0504   CHOLHDL 10.0 06/13/2009 0504   VLDL 44 (H) 06/13/2009 0504   LDLCALC (H) 06/13/2009 0504    163        Total Cholesterol/HDL:CHD Risk Coronary Heart Disease Risk Table  Men   Women  1/2 Average Risk   3.4   3.3  Average Risk       5.0   4.4  2 X Average Risk   9.6   7.1  3 X Average Risk  23.4   11.0        Use the calculated Patient Ratio above and the CHD Risk Table to determine the patient's CHD Risk.        ATP III CLASSIFICATION (LDL):  <100     mg/dL   Optimal  161-096  mg/dL   Near or Above                    Optimal  130-159  mg/dL   Borderline  045-409  mg/dL   High  >811     mg/dL   Very High    Other Studies Reviewed Today:  EXAM:  CT ANGIOGRAPHY CHEST WITH CONTRAST [facility] 04/05/2021  TECHNIQUE:  Multidetector CT imaging of the chest was performed using the  standard protocol during bolus administration of intravenous  contrast. Multiplanar CT image reconstructions and MIPs were  obtained to evaluate the vascular anatomy.   CONTRAST: 75 mL of Omnipaque 350   COMPARISON: None.   FINDINGS:  Cardiovascular: There is marked severity calcification of the  thoracic aorta, without evidence of aneurysmal dilatation or  dissection. Satisfactory opacification of the pulmonary arteries to  the segmental level. No evidence of pulmonary embolism. Normal heart  size. No pericardial effusion.    Mediastinum/Nodes: No enlarged mediastinal, hilar, or axillary lymph  nodes. Thyroid gland, trachea, and esophagus demonstrate no  significant findings.   Lungs/Pleura: There is moderate severity emphysematous lung disease.   Mild to moderate severity areas of scarring and/or atelectasis are  seen within the bilateral apices.   There is no evidence of a pleural effusion or pneumothorax.   Upper Abdomen: No acute abnormality.   Musculoskeletal: Multiple sternal wires are seen.   Multilevel degenerative changes are noted throughout the thoracic  spine.   Review of the MIP images confirms the above findings.   IMPRESSION:  1. No evidence of pulmonary embolism or acute cardiopulmonary  disease.  2. Moderate severity emphysematous lung disease.  3. Evidence of prior median sternotomy/CABG.       ABI 12/17/2019 Summary: Right: Resting right ankle-brachial index indicates mild right lower extremity arterial disease. The right toebrachial index is abnormal. Left: Resting left ankle-brachial index is within normal range. No evidence of significant left lower extremity arterial disease. The left toe-brachial index is abnormal   Lower arterial duplex 12/17/2019 Summary: Right: 50-74% stenosis noted in the superficial femoral artery. Left: 50-74% stenosis noted in the superficial femoral artery.    Carotid duplex 02/16/2020 Right Carotid: Velocities in the right ICA are consistent with a 1-39% stenosis. Nonhemodynamically significant plaque <50% noted in the CCA. The ECA appears >50% stenosed. Left Carotid: Velocities in the left ICA are consistent with a 60-79% stenosis. Nonhemodynamically significant plaque <50% noted in the CCA. The ECA appears >50% stenosed. Vertebrals: Bilateral vertebral arteries demonstrate antegrade flow. Subclavians: Bilateral subclavian artery flow was disturbed.     10/28/2017 Echocardiogram Study Conclusions   - Left ventricle: The cavity  size was mildly dilated. Wall  thickness was increased in a pattern of mild LVH. Systolic  function was severely reduced. The estimated ejection fraction  was 25%. Diffuse hypokinesis. There is akinesis of the  basal-midinferior myocardium. Features are consistent with a  pseudonormal left  ventricular filling pattern, with concomitant  abnormal relaxation and increased filling pressure (grade 2  diastolic dysfunction).  - Aortic valve: Mildly calcified annulus. Trileaflet.  - Mitral valve: Mildly thickened leaflets . There was trivial  regurgitation.  - Right atrium: Central venous pressure (est): 3 mm Hg.  - Tricuspid valve: There was trivial regurgitation.  - Pulmonary arteries: PA peak pressure: 25 mm Hg (S).  - Pericardium, extracardiac: There was no pericardial effusion.   Impressions:   - Mild LVH with mild chamber dilatation and LVEF approximately 25%.  Diffuse hypokinesis with mid to basal inferior akinesis. Grade 2  diastolic dysfunction. Mildly thickened mitral leaflets with  trivial mitral regurgitation. Trivial tricuspid regurgitation  with PASP 25 mmHg.     12/18/2016 Coronary Stent Intervention  Right/Left Heart Cath and Coronary/Graft Angiography    Conclusion  Conclusions: 1.  Significant 2-vessel native coronary artery disease, including moderate diffuse proximal LAD disease and chronic total occlusion of mid LAD and ostial RCA.  70% proximal D1 stenosis is also present. 2.  Moderate, non-obstructive disease involving LCx.  Small branch of OM2 is chronically occluded. 3.  Widely patent LIMA->LAD. 4.  Patent SVG->rPDA with mild diffuse disease and discrete 95% stenosis at the distal anastomosis. 5.  Chronically occluded SVG->D1. 6.  Mildly elevated left and right heart filling pressures, as well as moderate pulmonary hypertension. 7.  Normal to mildly reduced cardiac output. 8.  Successful PCI to distal SVG->rPDA anastomosis using a  Resolute Onxy 4.0 x 12 mm drug-eluting stent with 0% residual stenosis and TIMI-3 flow.  Recommendations: 1.  Admit for overnight observation following PCI with severe systolic dysfunction. 2.  Dual antiplatelet therapy with aspirin and ticagrelor for at least 6-12 months, ideally indefinitely.  If patient experiences respiratory side effects from ticagrelor, he could be switched to clopidogrel. 3.  Avoid further IV hydration today; restart diuresis tomorrow if renal function allows. 4.  Aggressive medical therapy for severe ischemic cardiomyopathy.  If renal function stable, can consider starting ACEI tomorrow. 5.  Close outpatient follow-up with Dr. Wyline Mood   Assessment and Plan:  1. Chronic systolic heart failure (HCC)   2. CAD in native artery   3. Essential hypertension   4. Tobacco abuse   5. Chronic obstructive pulmonary disease, unspecified COPD type (HCC)   6. Bilateral carotid artery stenosis    1. Chronic systolic heart failure (HCC) History of chronic systolic heart failure.  Last known echocardiogram November 2018 demonstrated systolic function was severely reduced with EF of 25% with diffuse hypokinesis.  LV cavity size mildly dilated.  Mild LVH.  Akinesis of the basal/mid inferior myocardium.  G2 DD.  Trivial MR.  Trivial TR, PASP 20 mmHg.  Continue Lasix 20 mg daily.  Continue Entresto 97/103 mg p.o. twice daily.  Continue Toprol-XL 200 mg daily.  He continues with chronic dyspnea.  He attributes this more to lung disease than his heart failure.  Refuses follow-up echocardiogram to reassess LV function, diastolic function, valvular function.  Continue Toprol-XL 200 mg daily.  Denies any recent weight gain.  States he has lost some weight recently.  Today's weight 188.  2. CAD in native artery Denies any anginal symptoms.  Has chronic exertional dyspnea from COPD/long history of smoking.  Continue aspirin 81 mg daily.  Continue sublingual nitroglycerin as needed.  3. Essential  hypertension Pressures on the low side today at 92/64.  He denies any symptoms.  Continue Toprol-XL 200 mg p.o. daily.  4. Tobacco abuse Significant history  of smoking greater than 50+ years per his statement.  States since discharge from hospital recently he restarted smoking.  Advised cessation if possible.  5. Chronic obstructive pulmonary disease, unspecified COPD type (HCC) History of chronic dyspnea from COPD.  Recent CT during hospital stay for COPD exacerbation demonstrated moderately severe emphysematous lung disease.  Mild to moderate severity of areas of scarring and atelectasis are seen within the bilateral apices.  Continues smoking.  6. Bilateral carotid artery stenosis Last carotid duplex study in March 2021: RICA 1 to 39% stenosis.  LICA 60 to 79% stenosis.  Follows with vascular  Medication Adjustments/Labs and Tests Ordered: Current medicines are reviewed at length with the patient today.  Concerns regarding medicines are outlined above.   Disposition: Follow-up with Dr. Wyline MoodBranch or APP 6 months.  Signed, Rennis HardingAndrew Quinn, NP 04/18/2021 11:53 PM    [facility] [address], [place], [place] [address] Phone: [phone]; Fax: [phone]

## 2021-04-19 ENCOUNTER — Ambulatory Visit (INDEPENDENT_AMBULATORY_CARE_PROVIDER_SITE_OTHER): Payer: Medicare Other | Admitting: Family Medicine

## 2021-04-19 ENCOUNTER — Encounter: Payer: Self-pay | Admitting: Family Medicine

## 2021-04-19 ENCOUNTER — Other Ambulatory Visit: Payer: Self-pay

## 2021-04-19 VITALS — BP 92/64 | HR 85 | Ht 70.0 in | Wt 188.6 lb

## 2021-04-19 DIAGNOSIS — I5022 Chronic systolic (congestive) heart failure: Secondary | ICD-10-CM | POA: Diagnosis not present

## 2021-04-19 DIAGNOSIS — I6523 Occlusion and stenosis of bilateral carotid arteries: Secondary | ICD-10-CM | POA: Diagnosis not present

## 2021-04-19 DIAGNOSIS — I10 Essential (primary) hypertension: Secondary | ICD-10-CM

## 2021-04-19 DIAGNOSIS — I2510 Atherosclerotic heart disease of native coronary artery without angina pectoris: Secondary | ICD-10-CM | POA: Diagnosis not present

## 2021-04-19 DIAGNOSIS — J449 Chronic obstructive pulmonary disease, unspecified: Secondary | ICD-10-CM | POA: Diagnosis not present

## 2021-04-19 DIAGNOSIS — Z720 Tobacco use: Secondary | ICD-10-CM

## 2021-04-19 MED ORDER — ATORVASTATIN CALCIUM 80 MG PO TABS: 1.0000 | ORAL_TABLET | Freq: Every day | ORAL | 1 refills | Status: DC

## 2021-04-19 NOTE — Patient Instructions (Signed)
Medication Instructions:  Continue all current medications.   Labwork: none  Testing/Procedures: none  Follow-Up: 6 months   Any Other Special Instructions Will Be Listed Below (If Applicable).   If you need a refill on your cardiac medications before your next appointment, please call your pharmacy.  

## 2021-05-30 DIAGNOSIS — I2510 Atherosclerotic heart disease of native coronary artery without angina pectoris: Secondary | ICD-10-CM | POA: Diagnosis present

## 2021-05-30 DIAGNOSIS — Z8679 Personal history of other diseases of the circulatory system: Secondary | ICD-10-CM | POA: Diagnosis not present

## 2021-05-30 DIAGNOSIS — F17210 Nicotine dependence, cigarettes, uncomplicated: Secondary | ICD-10-CM | POA: Diagnosis present

## 2021-05-30 DIAGNOSIS — Z28310 Unvaccinated for covid-19: Secondary | ICD-10-CM | POA: Diagnosis not present

## 2021-05-30 DIAGNOSIS — Z716 Tobacco abuse counseling: Secondary | ICD-10-CM | POA: Diagnosis not present

## 2021-05-30 DIAGNOSIS — I4892 Unspecified atrial flutter: Secondary | ICD-10-CM | POA: Diagnosis not present

## 2021-05-30 DIAGNOSIS — R0603 Acute respiratory distress: Secondary | ICD-10-CM | POA: Diagnosis not present

## 2021-05-30 DIAGNOSIS — E875 Hyperkalemia: Secondary | ICD-10-CM | POA: Diagnosis present

## 2021-05-30 DIAGNOSIS — Z20822 Contact with and (suspected) exposure to covid-19: Secondary | ICD-10-CM | POA: Diagnosis not present

## 2021-05-30 DIAGNOSIS — R7881 Bacteremia: Secondary | ICD-10-CM | POA: Diagnosis not present

## 2021-05-30 DIAGNOSIS — R778 Other specified abnormalities of plasma proteins: Secondary | ICD-10-CM | POA: Diagnosis present

## 2021-05-30 DIAGNOSIS — R059 Cough, unspecified: Secondary | ICD-10-CM | POA: Diagnosis not present

## 2021-05-30 DIAGNOSIS — I255 Ischemic cardiomyopathy: Secondary | ICD-10-CM | POA: Diagnosis present

## 2021-05-30 DIAGNOSIS — I480 Paroxysmal atrial fibrillation: Secondary | ICD-10-CM | POA: Diagnosis not present

## 2021-05-30 DIAGNOSIS — J9622 Acute and chronic respiratory failure with hypercapnia: Secondary | ICD-10-CM | POA: Diagnosis present

## 2021-05-30 DIAGNOSIS — J439 Emphysema, unspecified: Secondary | ICD-10-CM | POA: Diagnosis not present

## 2021-05-30 DIAGNOSIS — R0602 Shortness of breath: Secondary | ICD-10-CM | POA: Diagnosis not present

## 2021-05-30 DIAGNOSIS — J9621 Acute and chronic respiratory failure with hypoxia: Secondary | ICD-10-CM | POA: Diagnosis present

## 2021-05-30 DIAGNOSIS — J441 Chronic obstructive pulmonary disease with (acute) exacerbation: Secondary | ICD-10-CM | POA: Diagnosis not present

## 2021-05-30 DIAGNOSIS — E785 Hyperlipidemia, unspecified: Secondary | ICD-10-CM | POA: Diagnosis present

## 2021-05-30 DIAGNOSIS — E7800 Pure hypercholesterolemia, unspecified: Secondary | ICD-10-CM | POA: Diagnosis present

## 2021-05-30 DIAGNOSIS — J80 Acute respiratory distress syndrome: Secondary | ICD-10-CM | POA: Diagnosis not present

## 2021-05-30 DIAGNOSIS — Z720 Tobacco use: Secondary | ICD-10-CM | POA: Diagnosis not present

## 2021-05-30 DIAGNOSIS — I739 Peripheral vascular disease, unspecified: Secondary | ICD-10-CM | POA: Diagnosis present

## 2021-05-30 DIAGNOSIS — B9689 Other specified bacterial agents as the cause of diseases classified elsewhere: Secondary | ICD-10-CM | POA: Diagnosis not present

## 2021-05-30 DIAGNOSIS — I5022 Chronic systolic (congestive) heart failure: Secondary | ICD-10-CM | POA: Diagnosis present

## 2021-05-30 DIAGNOSIS — I4891 Unspecified atrial fibrillation: Secondary | ICD-10-CM | POA: Diagnosis not present

## 2021-05-30 DIAGNOSIS — B954 Other streptococcus as the cause of diseases classified elsewhere: Secondary | ICD-10-CM | POA: Diagnosis not present

## 2021-05-30 DIAGNOSIS — I248 Other forms of acute ischemic heart disease: Secondary | ICD-10-CM | POA: Diagnosis present

## 2021-05-30 DIAGNOSIS — R52 Pain, unspecified: Secondary | ICD-10-CM | POA: Diagnosis not present

## 2021-05-30 DIAGNOSIS — Z95810 Presence of automatic (implantable) cardiac defibrillator: Secondary | ICD-10-CM | POA: Diagnosis not present

## 2021-05-30 DIAGNOSIS — Z7901 Long term (current) use of anticoagulants: Secondary | ICD-10-CM | POA: Diagnosis not present

## 2021-05-30 DIAGNOSIS — Z7902 Long term (current) use of antithrombotics/antiplatelets: Secondary | ICD-10-CM | POA: Diagnosis not present

## 2021-05-30 DIAGNOSIS — Z7982 Long term (current) use of aspirin: Secondary | ICD-10-CM | POA: Diagnosis not present

## 2021-05-30 DIAGNOSIS — I429 Cardiomyopathy, unspecified: Secondary | ICD-10-CM | POA: Diagnosis not present

## 2021-05-30 DIAGNOSIS — I272 Pulmonary hypertension, unspecified: Secondary | ICD-10-CM | POA: Diagnosis present

## 2021-05-30 DIAGNOSIS — U071 COVID-19: Secondary | ICD-10-CM | POA: Diagnosis not present

## 2021-05-30 DIAGNOSIS — I509 Heart failure, unspecified: Secondary | ICD-10-CM | POA: Diagnosis not present

## 2021-05-30 DIAGNOSIS — I081 Rheumatic disorders of both mitral and tricuspid valves: Secondary | ICD-10-CM | POA: Diagnosis not present

## 2021-05-30 DIAGNOSIS — I110 Hypertensive heart disease with heart failure: Secondary | ICD-10-CM | POA: Diagnosis present

## 2021-05-30 DIAGNOSIS — R079 Chest pain, unspecified: Secondary | ICD-10-CM | POA: Diagnosis not present

## 2021-05-30 DIAGNOSIS — I4819 Other persistent atrial fibrillation: Secondary | ICD-10-CM | POA: Diagnosis present

## 2021-05-30 DIAGNOSIS — I484 Atypical atrial flutter: Secondary | ICD-10-CM | POA: Diagnosis not present

## 2021-06-11 ENCOUNTER — Other Ambulatory Visit: Payer: Self-pay | Admitting: Cardiology

## 2021-06-12 DIAGNOSIS — I255 Ischemic cardiomyopathy: Secondary | ICD-10-CM | POA: Diagnosis not present

## 2021-06-12 DIAGNOSIS — Z7951 Long term (current) use of inhaled steroids: Secondary | ICD-10-CM | POA: Diagnosis not present

## 2021-06-12 DIAGNOSIS — Z7982 Long term (current) use of aspirin: Secondary | ICD-10-CM | POA: Diagnosis not present

## 2021-06-12 DIAGNOSIS — Z7901 Long term (current) use of anticoagulants: Secondary | ICD-10-CM | POA: Diagnosis not present

## 2021-06-12 DIAGNOSIS — I6523 Occlusion and stenosis of bilateral carotid arteries: Secondary | ICD-10-CM | POA: Diagnosis not present

## 2021-06-12 DIAGNOSIS — E7800 Pure hypercholesterolemia, unspecified: Secondary | ICD-10-CM | POA: Diagnosis not present

## 2021-06-12 DIAGNOSIS — F17210 Nicotine dependence, cigarettes, uncomplicated: Secondary | ICD-10-CM | POA: Diagnosis not present

## 2021-06-12 DIAGNOSIS — J9622 Acute and chronic respiratory failure with hypercapnia: Secondary | ICD-10-CM | POA: Diagnosis not present

## 2021-06-12 DIAGNOSIS — J9621 Acute and chronic respiratory failure with hypoxia: Secondary | ICD-10-CM | POA: Diagnosis not present

## 2021-06-12 DIAGNOSIS — Z9181 History of falling: Secondary | ICD-10-CM | POA: Diagnosis not present

## 2021-06-12 DIAGNOSIS — I4892 Unspecified atrial flutter: Secondary | ICD-10-CM | POA: Diagnosis not present

## 2021-06-12 DIAGNOSIS — J441 Chronic obstructive pulmonary disease with (acute) exacerbation: Secondary | ICD-10-CM | POA: Diagnosis not present

## 2021-06-12 DIAGNOSIS — Z951 Presence of aortocoronary bypass graft: Secondary | ICD-10-CM | POA: Diagnosis not present

## 2021-06-12 DIAGNOSIS — I110 Hypertensive heart disease with heart failure: Secondary | ICD-10-CM | POA: Diagnosis not present

## 2021-06-12 DIAGNOSIS — I4891 Unspecified atrial fibrillation: Secondary | ICD-10-CM | POA: Diagnosis not present

## 2021-06-12 DIAGNOSIS — I2510 Atherosclerotic heart disease of native coronary artery without angina pectoris: Secondary | ICD-10-CM | POA: Diagnosis not present

## 2021-06-12 DIAGNOSIS — I739 Peripheral vascular disease, unspecified: Secondary | ICD-10-CM | POA: Diagnosis not present

## 2021-06-12 DIAGNOSIS — Z9981 Dependence on supplemental oxygen: Secondary | ICD-10-CM | POA: Diagnosis not present

## 2021-06-12 DIAGNOSIS — I5022 Chronic systolic (congestive) heart failure: Secondary | ICD-10-CM | POA: Diagnosis not present

## 2021-06-17 DIAGNOSIS — I110 Hypertensive heart disease with heart failure: Secondary | ICD-10-CM | POA: Diagnosis not present

## 2021-06-17 DIAGNOSIS — J9621 Acute and chronic respiratory failure with hypoxia: Secondary | ICD-10-CM | POA: Diagnosis not present

## 2021-06-17 DIAGNOSIS — J441 Chronic obstructive pulmonary disease with (acute) exacerbation: Secondary | ICD-10-CM | POA: Diagnosis not present

## 2021-06-17 DIAGNOSIS — J9622 Acute and chronic respiratory failure with hypercapnia: Secondary | ICD-10-CM | POA: Diagnosis not present

## 2021-06-17 DIAGNOSIS — I4892 Unspecified atrial flutter: Secondary | ICD-10-CM | POA: Diagnosis not present

## 2021-06-17 DIAGNOSIS — I4891 Unspecified atrial fibrillation: Secondary | ICD-10-CM | POA: Diagnosis not present

## 2021-06-19 DIAGNOSIS — Z299 Encounter for prophylactic measures, unspecified: Secondary | ICD-10-CM | POA: Diagnosis not present

## 2021-06-19 DIAGNOSIS — I25119 Atherosclerotic heart disease of native coronary artery with unspecified angina pectoris: Secondary | ICD-10-CM | POA: Diagnosis not present

## 2021-06-19 DIAGNOSIS — F17210 Nicotine dependence, cigarettes, uncomplicated: Secondary | ICD-10-CM | POA: Diagnosis not present

## 2021-06-19 DIAGNOSIS — I739 Peripheral vascular disease, unspecified: Secondary | ICD-10-CM | POA: Diagnosis not present

## 2021-06-19 DIAGNOSIS — J449 Chronic obstructive pulmonary disease, unspecified: Secondary | ICD-10-CM | POA: Diagnosis not present

## 2021-06-19 DIAGNOSIS — I472 Ventricular tachycardia: Secondary | ICD-10-CM | POA: Diagnosis not present

## 2021-06-21 DIAGNOSIS — J441 Chronic obstructive pulmonary disease with (acute) exacerbation: Secondary | ICD-10-CM | POA: Diagnosis not present

## 2021-06-21 DIAGNOSIS — I4891 Unspecified atrial fibrillation: Secondary | ICD-10-CM | POA: Diagnosis not present

## 2021-06-21 DIAGNOSIS — J9621 Acute and chronic respiratory failure with hypoxia: Secondary | ICD-10-CM | POA: Diagnosis not present

## 2021-06-21 DIAGNOSIS — I4892 Unspecified atrial flutter: Secondary | ICD-10-CM | POA: Diagnosis not present

## 2021-06-21 DIAGNOSIS — I110 Hypertensive heart disease with heart failure: Secondary | ICD-10-CM | POA: Diagnosis not present

## 2021-06-21 DIAGNOSIS — J9622 Acute and chronic respiratory failure with hypercapnia: Secondary | ICD-10-CM | POA: Diagnosis not present

## 2021-07-02 ENCOUNTER — Other Ambulatory Visit: Payer: Self-pay | Admitting: Family Medicine

## 2021-07-02 DIAGNOSIS — J9622 Acute and chronic respiratory failure with hypercapnia: Secondary | ICD-10-CM | POA: Diagnosis not present

## 2021-07-02 DIAGNOSIS — J441 Chronic obstructive pulmonary disease with (acute) exacerbation: Secondary | ICD-10-CM | POA: Diagnosis not present

## 2021-07-02 DIAGNOSIS — I4891 Unspecified atrial fibrillation: Secondary | ICD-10-CM | POA: Diagnosis not present

## 2021-07-02 DIAGNOSIS — I4892 Unspecified atrial flutter: Secondary | ICD-10-CM | POA: Diagnosis not present

## 2021-07-02 DIAGNOSIS — I110 Hypertensive heart disease with heart failure: Secondary | ICD-10-CM | POA: Diagnosis not present

## 2021-07-02 DIAGNOSIS — J9621 Acute and chronic respiratory failure with hypoxia: Secondary | ICD-10-CM | POA: Diagnosis not present

## 2021-07-02 NOTE — Telephone Encounter (Signed)
Prescription refill request for Eliquis received. Indication: Atrial Fib Last office visit: 04/19/21 Dave Schneiders NP Scr: 1.15 on 06/08/21 Age: 65 Weight: 85.5kg  Based on above findings Eliquis 5mg  twice daily is the appropriate dose.  Refill approved.

## 2021-07-10 ENCOUNTER — Other Ambulatory Visit: Payer: Self-pay | Admitting: Cardiology

## 2021-07-10 ENCOUNTER — Encounter: Payer: Self-pay | Admitting: *Deleted

## 2021-07-10 DIAGNOSIS — J441 Chronic obstructive pulmonary disease with (acute) exacerbation: Secondary | ICD-10-CM | POA: Diagnosis not present

## 2021-07-11 NOTE — Progress Notes (Signed)
Cardiology Office Note  Date: 07/12/2021   ID: Dave [name], DOB 12-04-1956, MRN [id number]  PCP:  Ignatius Specking, MD  Cardiologist:  [name], MD Electrophysiologist:  None   Chief Complaint: Hospital follow-up  History of Present Illness: Dave [name] is a 65 y.o. male with a history of CAD (CABG x3: LIMA-LAD, SVG-diagonal, SVG-PDA), MI, chronic systolic heart failure, elevated blood pressure, tobacco abuse, SOB/COPD, PAD, carotid stenosis, HTN  Previously seen by Dr. Wyline Mood 03/08/2020. Cardiac catheterization 12/2016 : DES to distal SVG-rPDA. RHC : CI 2, Mean PA 39, PCWP 29, LVEDP 22. Echocardiogram 2018 EF 25%. Previously refused ICD. Not interested in cardiac rehab. Was compliant with medications. COPD was followed by PCP, PAD was followed by vascular. He was compliant with BP medications. No symptoms indicative of CAD or CHF. Labs were requested from PCP. If K remained in normal range may try low dose aldactone at follow up.  He was admitted to [facility] health care on 05/30/2021 for COPD exacerbation and new onset atrial fibrillation.  He was received by EMS for shortness of breath and palpitations x 3 to 4 days. He was started on IV Rocephin and azithromycin and given nicotine patch.  He was started on O2 and BiPAP.  BiPAP was offered outpatient but patient refused.  He had been noncompliant.  History of cardiomyopathy with EF of 25%.  He was seen by cardiologist.  He was started on Cardizem drip secondary to atrial fibrillation.  He was given Lovenox and eventually transitioned to Eliquis.  Digoxin was also added for rate control.  He was given IV Lopressor during hospital stay.  Prior to discharge he was started on Eliquis 5 mg p.o. twice daily.  He was continuing digoxin 0.125 mg p.o. daily.  Entresto 97/103 mg p.o. twice daily, Toprol XL 200 mg daily, Aspirin 81 mg daily, atorvastatin 80 mg daily.  He continued to smoke.  He uses 2 L oxygen via nasal cannula at home.  He  had declined ICD implant in the past.  Per records his CHA2DS2-VASc score was 3 (HTN, HF, vascular).  Brilinta was discontinued in favor of DOAC for CVA prophylaxis related to atrial fib/flutter.  He is here status post discharge from recent hospital stay.  He states he feels significantly drained and tired.  He states he was in the hospital for approximately 10 days.  EKG today shows continuation and atrial flutter with a rate of 83.Marland Kitchen  His daughter states he is taking his medications as directed.  He denies any bleeding on Eliquis.  He continues to smoke.  He is on O2 here today and at home.  Blood pressure continues to be on the low side which is normal for him per daughter.  States he does have some shortness of breath and activity intolerance/DOE.  He has significant history of COPD.  Last echocardiogram showed an EF of 20 to 25%.  He had previously deferred ICD placement.  His current cardiac regimen includes aspirin 81 mg daily, atorvastatin 80 mg daily, digoxin 0.125 mg daily, Eliquis 5 mg p.o. twice daily, Entresto 97/103 mg p.o. twice daily, Toprol-XL 200 mg daily, sublingual nitroglycerin.    Past Medical History:  Diagnosis Date   Anterior myocardial infarction [facility]) 06/2009   Dave [name] 12/16/2016   Anxiety    Anxiety disorder    Chest pain    Chronic brain syndrome    Elevated blood pressure    Tobacco abuse     Past  Surgical History:  Procedure Laterality Date   CARDIAC CATHETERIZATION  06/2009   Dave [name]/notes 12/16/2016   CARDIAC CATHETERIZATION N/A 12/18/2016   Procedure: Right/Left Heart Cath and Coronary/Graft Angiography;  Surgeon: Yvonne Kendallhristopher End, MD;  [facility];  Service: Cardiovascular;  Laterality: N/A;   CARDIAC CATHETERIZATION N/A 12/18/2016   Procedure: Coronary Stent Intervention;  Surgeon: Yvonne Kendallhristopher End, MD;  Location: [facility] INVASIVE CV LAB;  Service: Cardiovascular;  Laterality: N/A;  SVG-PDA   CORONARY ANGIOPLASTY     CORONARY ARTERY BYPASS GRAFT  2010   X3    LAPAROSCOPIC CHOLECYSTECTOMY  2007    Current Outpatient Medications  Medication Sig Dispense Refill   apixaban (ELIQUIS) 5 MG TABS tablet TAKE ONE TABLET BY MOUTH TWICE DAILY 60 tablet 5   atorvastatin (LIPITOR) 80 MG tablet TAKE ONE TABLET BY MOUTH DAILY 90 tablet 2   furosemide (LASIX) 20 MG tablet Take 1 tablet (20 mg total) by mouth daily as needed (for weight gain of 3 lbs in 24 hours or 5 lbs in one week). 30 tablet 1   Ipratropium-Albuterol (COMBIVENT RESPIMAT) 20-100 MCG/ACT AERS respimat Inhale 1 puff into the lungs every 6 (six) hours.     metoprolol (TOPROL-XL) 200 MG 24 hr tablet TAKE ONE TABLET BY MOUTH DAILY 90 tablet 1   nitroGLYCERIN (NITROSTAT) 0.4 MG SL tablet Place 1 tablet (0.4 mg total) under the tongue every 5 (five) minutes as needed for chest pain. 25 tablet 2   PROAIR HFA 108 (90 Base) MCG/ACT inhaler Inhale 2 puffs into the lungs 4 (four) times daily as needed for shortness of breath.      aspirin EC 81 MG tablet Take 81 mg by mouth daily.     digoxin (LANOXIN) 0.125 MG tablet Take 125 mcg by mouth every morning.     ENTRESTO 97-103 MG TAKE ONE TABLET BY MOUTH TWICE DAILY. 180 tablet 1   TRELEGY ELLIPTA 100-62.5-25 MCG/INH AEPB Inhale 1 puff into the lungs daily.     TUSSIN DM 100-10 MG/5ML liquid Take 5 mLs by mouth every 4 (four) hours as needed.     No current facility-administered medications for this visit.   Allergies:  Patient has no known allergies.   Social History: The patient  reports that he has been smoking. He started smoking about 50 years ago. He has a 44.00 pack-year smoking history. He has never used smokeless tobacco. He reports current drug use. Drug: Marijuana. He reports that he does not drink alcohol.   Family History: The patient's family history includes Coronary artery disease in an other family member.   ROS:  Please see the history of present illness. Otherwise, complete review of systems is positive for none.  All other systems are  reviewed and negative.   Physical Exam: VS:  BP (!) 88/60   Pulse 80   Ht 5\' 10"  (1.778 m)   Wt 179 lb 9.6 oz (81.5 kg)   SpO2 98%   BMI 25.77 kg/m , BMI Body mass index is 25.77 kg/m.  Wt Readings from Last 3 Encounters:  07/12/21 179 lb 9.6 oz (81.5 kg)  04/19/21 188 lb 9.6 oz (85.5 kg)  03/08/20 194 lb (88 kg)    General: Patient appears comfortable at rest. Neck: Supple, no elevated JVP or carotid bruits, no thyromegaly. Lungs: Slightly decreased sounds.  Prolonged expiratory phase., nonlabored breathing at rest. Cardiac: Irregularly irregular rate and rhythm, no S3 or significant systolic murmur, no pericardial rub. Extremities: No pitting edema, distal  pulses 2+. Skin: Warm and dry. Musculoskeletal: No kyphosis. Neuropsychiatric: Alert and oriented x3, affect grossly appropriate.  ECG: 07/12/2021 atrial flutter rate of 83, septal infarct, age undetermined, PVC.  ST and T wave abnormality, consider inferior ischemia.  Recent Labwork: No results found for requested labs within last 8760 hours.     Component Value Date/Time   CHOL (H) 06/13/2009 0504    230        ATP III CLASSIFICATION:  <200     mg/dL   Desirable  706-237  mg/dL   Borderline High  >=628    mg/dL   High          TRIG 315 (H) 06/13/2009 0504   HDL 23 (L) 06/13/2009 0504   CHOLHDL 10.0 06/13/2009 0504   VLDL 44 (H) 06/13/2009 0504   LDLCALC (H) 06/13/2009 0504    163        Total Cholesterol/HDL:CHD Risk Coronary Heart Disease Risk Table                     Men   Women  1/2 Average Risk   3.4   3.3  Average Risk       5.0   4.4  2 X Average Risk   9.6   7.1  3 X Average Risk  23.4   11.0        Use the calculated Patient Ratio above and the CHD Risk Table to determine the patient's CHD Risk.        ATP III CLASSIFICATION (LDL):  <100     mg/dL   Optimal  176-160  mg/dL   Near or Above                    Optimal  130-159  mg/dL   Borderline  737-106  mg/dL   High  >269     mg/dL   Very  High    Other Studies Reviewed Today:   Echocardiogram 06/03/2021 [facility] Summary    1. Technically difficult study.    2. The left ventricle is moderately dilated in size with normal wall  thickness.    3. The left ventricular systolic function is severely decreased, LVEF is  visually estimated at 20-25%.    4. There is mild mitral valve regurgitation.    5. The left atrium is moderately dilated in size.    6. The right ventricle is normal in size, with normal systolic function.    7. There is mild pulmonary hypertension, estimated pulmonary artery systolic  pressure is 46 mmHg.    8. The right atrium is mildly dilated  in size.     EXAM:  CT ANGIOGRAPHY CHEST WITH CONTRAST [facility] 04/05/2021  TECHNIQUE:  Multidetector CT imaging of the chest was performed using the  standard protocol during bolus administration of intravenous  contrast. Multiplanar CT image reconstructions and MIPs were  obtained to evaluate the vascular anatomy.   CONTRAST:  75 mL of Omnipaque 350   COMPARISON:  None.   FINDINGS:  Cardiovascular: There is marked severity calcification of the  thoracic aorta, without evidence of aneurysmal dilatation or  dissection. Satisfactory opacification of the pulmonary arteries to  the segmental level. No evidence of pulmonary embolism. Normal heart  size. No pericardial effusion.   Mediastinum/Nodes: No enlarged mediastinal, hilar, or axillary lymph  nodes. Thyroid gland, trachea, and esophagus demonstrate no  significant findings.   Lungs/Pleura: There is moderate severity emphysematous lung  disease.   Mild to moderate severity areas of scarring and/or atelectasis are  seen within the bilateral apices.   There is no evidence of a pleural effusion or pneumothorax.   Upper Abdomen: No acute abnormality.   Musculoskeletal: Multiple sternal wires are seen.   Multilevel degenerative changes are noted throughout the thoracic  spine.    Review of the MIP images confirms the above findings.   IMPRESSION:  1. No evidence of pulmonary embolism or acute cardiopulmonary  disease.  2. Moderate severity emphysematous lung disease.  3. Evidence of prior median sternotomy/CABG.       ABI 12/17/2019 Summary: Right: Resting right ankle-brachial index indicates mild right lower extremity arterial disease. The right toebrachial index is abnormal. Left: Resting left ankle-brachial index is within normal range. No evidence of significant left lower extremity arterial disease. The left toe-brachial index is abnormal   Lower arterial duplex 12/17/2019 Summary: Right: 50-74% stenosis noted in the superficial femoral artery. Left: 50-74% stenosis noted in the superficial femoral artery.    Carotid duplex 02/16/2020 Right Carotid: Velocities in the right ICA are consistent with a 1-39% stenosis. Nonhemodynamically significant plaque <50% noted in the CCA. The ECA appears >50% stenosed. Left Carotid: Velocities in the left ICA are consistent with a 60-79% stenosis. Nonhemodynamically significant plaque <50% noted in the CCA. The ECA appears >50% stenosed. Vertebrals: Bilateral vertebral arteries demonstrate antegrade flow. Subclavians: Bilateral subclavian artery flow was disturbed.     10/28/2017 Echocardiogram Study Conclusions   - Left ventricle: The cavity size was mildly dilated. Wall    thickness was increased in a pattern of mild LVH. Systolic    function was severely reduced. The estimated ejection fraction    was 25%. Diffuse hypokinesis. There is akinesis of the    basal-midinferior myocardium. Features are consistent with a    pseudonormal left ventricular filling pattern, with concomitant    abnormal relaxation and increased filling pressure (grade 2    diastolic dysfunction).  - Aortic valve: Mildly calcified annulus. Trileaflet.  - Mitral valve: Mildly thickened leaflets . There was trivial     regurgitation.  - Right atrium: Central venous pressure (est): 3 mm Hg.  - Tricuspid valve: There was trivial regurgitation.  - Pulmonary arteries: PA peak pressure: 25 mm Hg (S).  - Pericardium, extracardiac: There was no pericardial effusion.   Impressions:   - Mild LVH with mild chamber dilatation and LVEF approximately 25%.    Diffuse hypokinesis with mid to basal inferior akinesis. Grade 2    diastolic dysfunction. Mildly thickened mitral leaflets with    trivial mitral regurgitation. Trivial tricuspid regurgitation    with PASP 25 mmHg.     12/18/2016 Coronary Stent Intervention  Right/Left Heart Cath and Coronary/Graft Angiography    Conclusion  Conclusions: 1.  Significant 2-vessel native coronary artery disease, including moderate diffuse proximal LAD disease and chronic total occlusion of mid LAD and ostial RCA.  70% proximal D1 stenosis is also present. 2.  Moderate, non-obstructive disease involving LCx.  Small branch of OM2 is chronically occluded. 3.  Widely patent LIMA->LAD. 4.  Patent SVG->rPDA with mild diffuse disease and discrete 95% stenosis at the distal anastomosis. 5.  Chronically occluded SVG->D1. 6.  Mildly elevated left and right heart filling pressures, as well as moderate pulmonary hypertension. 7.  Normal to mildly reduced cardiac output. 8.  Successful PCI to distal SVG->rPDA anastomosis using a Resolute Onxy 4.0 x 12 mm drug-eluting stent with 0% residual stenosis and TIMI-3 flow.  Recommendations: 1.  Admit for overnight observation following PCI with severe systolic dysfunction. 2.  Dual antiplatelet therapy with aspirin and ticagrelor for at least 6-12 months, ideally indefinitely.  If patient experiences respiratory side effects from ticagrelor, he could be switched to clopidogrel. 3.  Avoid further IV hydration today; restart diuresis tomorrow if renal function allows. 4.  Aggressive medical therapy for severe ischemic cardiomyopathy.  If renal  function stable, can consider starting ACEI tomorrow. 5.  Close outpatient follow-up with Dr. Wyline Mood   Assessment and Plan:  1. Atrial fibrillation, unspecified type (HCC)   2. Chronic systolic heart failure (HCC)   3. CAD in native artery   4. Essential hypertension   5. Tobacco abuse   6. Bilateral carotid artery stenosis   7. Medication management    1. Atrial fibrillation New Onset He continues in atrial flutter on EKG today with a rate of 83.  Advised him to strictly adhere to Eliquis 5 mg p.o. twice daily uninterrupted.  Continue Toprol-XL 200 mg daily.  We discussed possible cardioversion versus rate control strategy.  He and daughter both verbalized understanding.   2. Chronic systolic heart failure (HCC) Recent echocardiogram at [facility] on 06/03/2021 demonstrated left ventricle was moderately dilated in size with normal wall thickness.  Systolic function measured at EF of 20 to 25%.  Mild mitral regurgitation.  Left atrium moderately dilated.  RV normal size and function.  Mild pulmonary hypertension with PASP at .  Patient's daughter states he was taken off of his Lasix at recent hospital stay.  She states she gave him a Lasix last night which helped his breathing some.  Please restart Lasix at 20 mg as needed for weight gain greater than 3 pounds in 24 hours or 5 pounds in 1 week.  Advised sodium restriction, fluid restriction of 60 cc daily.  Weigh daily.  Get a BMP and magnesium in 2 weeks.  3. CAD in native artery Denies any anginal symptoms.  Has chronic exertional dyspnea from COPD/long history of smoking.  Continue aspirin 81 mg daily.  Continue sublingual nitroglycerin as needed.  Please refill nitroglycerin  4. Essential hypertension Pressures on the low side today at 88/60 he denies any symptoms.  Continue Toprol-XL 200 mg p.o. daily.  5. Tobacco abuse Significant history of smoking greater than 50+ years per his statement.  States since discharge from  hospital recently he restarted smoking.  Advised cessation if possible.  6. Chronic obstructive pulmonary disease, unspecified COPD type (HCC) History of chronic dyspnea from COPD.  Recent CT during hospital stay for COPD exacerbation demonstrated moderately severe emphysematous lung disease.  Mild to moderate severity of areas of scarring and atelectasis are seen within the bilateral apices.  Continues smoking.  7. Bilateral carotid artery stenosis Last carotid duplex study in March 2021: RICA 1 to 39% stenosis.  LICA 60 to 79% stenosis.  Follows with vascular.  Please repeat carotid study.  Patient has not had a follow-up study since March of last year.  Medication Adjustments/Labs and Tests Ordered: Current medicines are reviewed at length with the patient today.  Concerns regarding medicines are outlined above.   Disposition: Follow-up with Dr. Wyline Mood or APP 1 month  Signed, Rennis Harding, NP 07/12/2021 4:43 PM    [facility] at [facility] [address] [place], [place], [place] [address] Phone: [phone]; Fax: [phone]

## 2021-07-12 ENCOUNTER — Other Ambulatory Visit: Payer: Self-pay

## 2021-07-12 ENCOUNTER — Ambulatory Visit (INDEPENDENT_AMBULATORY_CARE_PROVIDER_SITE_OTHER): Payer: Medicare Other | Admitting: Family Medicine

## 2021-07-12 ENCOUNTER — Encounter: Payer: Self-pay | Admitting: Family Medicine

## 2021-07-12 VITALS — BP 88/60 | HR 80 | Ht 70.0 in | Wt 179.6 lb

## 2021-07-12 DIAGNOSIS — I5022 Chronic systolic (congestive) heart failure: Secondary | ICD-10-CM

## 2021-07-12 DIAGNOSIS — J9621 Acute and chronic respiratory failure with hypoxia: Secondary | ICD-10-CM | POA: Diagnosis not present

## 2021-07-12 DIAGNOSIS — I6523 Occlusion and stenosis of bilateral carotid arteries: Secondary | ICD-10-CM

## 2021-07-12 DIAGNOSIS — Z7951 Long term (current) use of inhaled steroids: Secondary | ICD-10-CM | POA: Diagnosis not present

## 2021-07-12 DIAGNOSIS — I10 Essential (primary) hypertension: Secondary | ICD-10-CM | POA: Diagnosis not present

## 2021-07-12 DIAGNOSIS — J9622 Acute and chronic respiratory failure with hypercapnia: Secondary | ICD-10-CM | POA: Diagnosis not present

## 2021-07-12 DIAGNOSIS — J441 Chronic obstructive pulmonary disease with (acute) exacerbation: Secondary | ICD-10-CM | POA: Diagnosis not present

## 2021-07-12 DIAGNOSIS — Z7901 Long term (current) use of anticoagulants: Secondary | ICD-10-CM | POA: Diagnosis not present

## 2021-07-12 DIAGNOSIS — E7800 Pure hypercholesterolemia, unspecified: Secondary | ICD-10-CM | POA: Diagnosis not present

## 2021-07-12 DIAGNOSIS — I2510 Atherosclerotic heart disease of native coronary artery without angina pectoris: Secondary | ICD-10-CM | POA: Diagnosis not present

## 2021-07-12 DIAGNOSIS — I255 Ischemic cardiomyopathy: Secondary | ICD-10-CM | POA: Diagnosis not present

## 2021-07-12 DIAGNOSIS — Z951 Presence of aortocoronary bypass graft: Secondary | ICD-10-CM | POA: Diagnosis not present

## 2021-07-12 DIAGNOSIS — Z720 Tobacco use: Secondary | ICD-10-CM | POA: Diagnosis not present

## 2021-07-12 DIAGNOSIS — Z9981 Dependence on supplemental oxygen: Secondary | ICD-10-CM | POA: Diagnosis not present

## 2021-07-12 DIAGNOSIS — Z79899 Other long term (current) drug therapy: Secondary | ICD-10-CM

## 2021-07-12 DIAGNOSIS — I4892 Unspecified atrial flutter: Secondary | ICD-10-CM | POA: Diagnosis not present

## 2021-07-12 DIAGNOSIS — F17210 Nicotine dependence, cigarettes, uncomplicated: Secondary | ICD-10-CM | POA: Diagnosis not present

## 2021-07-12 DIAGNOSIS — I4891 Unspecified atrial fibrillation: Secondary | ICD-10-CM

## 2021-07-12 DIAGNOSIS — I110 Hypertensive heart disease with heart failure: Secondary | ICD-10-CM | POA: Diagnosis not present

## 2021-07-12 DIAGNOSIS — I739 Peripheral vascular disease, unspecified: Secondary | ICD-10-CM | POA: Diagnosis not present

## 2021-07-12 DIAGNOSIS — Z9181 History of falling: Secondary | ICD-10-CM | POA: Diagnosis not present

## 2021-07-12 DIAGNOSIS — Z7982 Long term (current) use of aspirin: Secondary | ICD-10-CM | POA: Diagnosis not present

## 2021-07-12 MED ORDER — NITROGLYCERIN 0.4 MG SL SUBL: 0.4000 mg | SUBLINGUAL_TABLET | SUBLINGUAL | 2 refills | Status: DC | PRN

## 2021-07-12 MED ORDER — FUROSEMIDE 20 MG PO TABS: 20.0000 mg | ORAL_TABLET | Freq: Every day | ORAL | 1 refills | Status: DC | PRN

## 2021-07-12 NOTE — Patient Instructions (Addendum)
Medication Instructions:  Restart furosemide 20 mg daily as needed for weight gain of 3 lbs in 24 hours or 5 lbs in one week Continue other medications the same  Labwork: BMET & Mg in 2 weeks at [facility]  Testing/Procedures: Your physician has requested that you have a carotid duplex. This test is an ultrasound of the carotid arteries in your neck. It looks at blood flow through these arteries that supply the brain with blood. Allow one hour for this exam. There are no restrictions or special instructions.  Follow-Up: Your physician recommends that you schedule a follow-up appointment in: 4 weeks  Any Other Special Instructions Will Be Listed Below (If Applicable).  If you need a refill on your cardiac medications before your next appointment, please call your pharmacy.

## 2021-07-15 NOTE — Addendum Note (Signed)
Addended by: Eustace Moore on: 07/15/2021 04:23 PM   Modules accepted: Orders

## 2021-07-16 DIAGNOSIS — J441 Chronic obstructive pulmonary disease with (acute) exacerbation: Secondary | ICD-10-CM | POA: Diagnosis not present

## 2021-07-16 DIAGNOSIS — I4891 Unspecified atrial fibrillation: Secondary | ICD-10-CM | POA: Diagnosis not present

## 2021-07-16 DIAGNOSIS — I4892 Unspecified atrial flutter: Secondary | ICD-10-CM | POA: Diagnosis not present

## 2021-07-16 DIAGNOSIS — J9622 Acute and chronic respiratory failure with hypercapnia: Secondary | ICD-10-CM | POA: Diagnosis not present

## 2021-07-16 DIAGNOSIS — I110 Hypertensive heart disease with heart failure: Secondary | ICD-10-CM | POA: Diagnosis not present

## 2021-07-16 DIAGNOSIS — J9621 Acute and chronic respiratory failure with hypoxia: Secondary | ICD-10-CM | POA: Diagnosis not present

## 2021-07-23 DIAGNOSIS — J441 Chronic obstructive pulmonary disease with (acute) exacerbation: Secondary | ICD-10-CM | POA: Diagnosis not present

## 2021-07-23 DIAGNOSIS — J9621 Acute and chronic respiratory failure with hypoxia: Secondary | ICD-10-CM | POA: Diagnosis not present

## 2021-07-23 DIAGNOSIS — J9622 Acute and chronic respiratory failure with hypercapnia: Secondary | ICD-10-CM | POA: Diagnosis not present

## 2021-07-23 DIAGNOSIS — I110 Hypertensive heart disease with heart failure: Secondary | ICD-10-CM | POA: Diagnosis not present

## 2021-07-23 DIAGNOSIS — I4892 Unspecified atrial flutter: Secondary | ICD-10-CM | POA: Diagnosis not present

## 2021-07-23 DIAGNOSIS — I4891 Unspecified atrial fibrillation: Secondary | ICD-10-CM | POA: Diagnosis not present

## 2021-07-31 ENCOUNTER — Encounter: Payer: Self-pay | Admitting: *Deleted

## 2021-08-07 DIAGNOSIS — J441 Chronic obstructive pulmonary disease with (acute) exacerbation: Secondary | ICD-10-CM | POA: Diagnosis not present

## 2021-08-07 DIAGNOSIS — I4891 Unspecified atrial fibrillation: Secondary | ICD-10-CM | POA: Diagnosis not present

## 2021-08-07 DIAGNOSIS — I110 Hypertensive heart disease with heart failure: Secondary | ICD-10-CM | POA: Diagnosis not present

## 2021-08-07 DIAGNOSIS — I4892 Unspecified atrial flutter: Secondary | ICD-10-CM | POA: Diagnosis not present

## 2021-08-07 DIAGNOSIS — J9622 Acute and chronic respiratory failure with hypercapnia: Secondary | ICD-10-CM | POA: Diagnosis not present

## 2021-08-07 DIAGNOSIS — J9621 Acute and chronic respiratory failure with hypoxia: Secondary | ICD-10-CM | POA: Diagnosis not present

## 2021-08-09 ENCOUNTER — Encounter: Payer: Self-pay | Admitting: Family Medicine

## 2021-08-09 ENCOUNTER — Ambulatory Visit (INDEPENDENT_AMBULATORY_CARE_PROVIDER_SITE_OTHER): Payer: Medicare Other | Admitting: Family Medicine

## 2021-08-09 ENCOUNTER — Other Ambulatory Visit: Payer: Self-pay

## 2021-08-09 VITALS — BP 105/71 | HR 79 | Ht 70.0 in | Wt 179.8 lb

## 2021-08-09 DIAGNOSIS — I5032 Chronic diastolic (congestive) heart failure: Secondary | ICD-10-CM

## 2021-08-09 DIAGNOSIS — I6523 Occlusion and stenosis of bilateral carotid arteries: Secondary | ICD-10-CM | POA: Diagnosis not present

## 2021-08-09 DIAGNOSIS — I4891 Unspecified atrial fibrillation: Secondary | ICD-10-CM | POA: Diagnosis not present

## 2021-08-09 DIAGNOSIS — I2510 Atherosclerotic heart disease of native coronary artery without angina pectoris: Secondary | ICD-10-CM

## 2021-08-09 DIAGNOSIS — I10 Essential (primary) hypertension: Secondary | ICD-10-CM | POA: Diagnosis not present

## 2021-08-09 DIAGNOSIS — Z720 Tobacco use: Secondary | ICD-10-CM | POA: Diagnosis not present

## 2021-08-09 DIAGNOSIS — J449 Chronic obstructive pulmonary disease, unspecified: Secondary | ICD-10-CM | POA: Diagnosis not present

## 2021-08-09 NOTE — Patient Instructions (Addendum)
Medication Instructions:  Continue all current medications.  Labwork: BMET, Mg - orders given today.  Office will contact with results via phone or letter.    Testing/Procedures: Your physician has requested that you have an echocardiogram. Echocardiography is a painless test that uses sound waves to create images of your heart. It provides your doctor with information about the size and shape of your heart and how well your heart's chambers and valves are working. This procedure takes approximately one hour. There are no restrictions for this procedure - DUE JUST PRIOR TO NEXT VISIT   Follow-Up: 3 months    Any Other Special Instructions Will Be Listed Below (If Applicable).   If you need a refill on your cardiac medications before your next appointment, please call your pharmacy.

## 2021-08-09 NOTE — Progress Notes (Signed)
Cardiology Office Note  Date: 08/09/2021   ID: Dave [name], DOB 03-25-1956, MRN [id number]  PCP:  Ignatius Specking, MD  Cardiologist:  [name], MD Electrophysiologist:  None   Chief Complaint: Hospital follow-up  History of Present Illness: Dave [name] is a 65 y.o. male with a history of CAD (CABG x3: LIMA-LAD, SVG-diagonal, SVG-PDA), MI, chronic systolic heart failure, elevated blood pressure, tobacco abuse, SOB/COPD, PAD, carotid stenosis, HTN  Previously seen by Dr. Wyline Mood 03/08/2020. Cardiac catheterization 12/2016 : DES to distal SVG-rPDA. RHC : CI 2, Mean PA 39, PCWP 29, LVEDP 22. Echocardiogram 2018 EF 25%. Previously refused ICD. Not interested in cardiac rehab. Was compliant with medications. COPD was followed by PCP, PAD was followed by vascular. He was compliant with BP medications. No symptoms indicative of CAD or CHF. Labs were requested from PCP. If K remained in normal range may try low dose aldactone at follow up.  He was admitted to [facility] health care on 05/30/2021 for COPD exacerbation and new onset atrial fibrillation.  He was received by EMS for shortness of breath and palpitations x 3 to 4 days. He was started on IV Rocephin and azithromycin and given nicotine patch.  He was started on O2 and BiPAP.  BiPAP was offered outpatient but patient refused.  He had been noncompliant.  History of cardiomyopathy with EF of 25%.  He was seen by cardiologist.  He was started on Cardizem drip secondary to atrial fibrillation.  He was given Lovenox and eventually transitioned to Eliquis.  Digoxin was also added for rate control.  He was given IV Lopressor during hospital stay.  Prior to discharge he was started on Eliquis 5 mg p.o. twice daily.  He was continuing digoxin 0.125 mg p.o. daily.  Entresto 97/103 mg p.o. twice daily, Toprol XL 200 mg daily, Aspirin 81 mg daily, atorvastatin 80 mg daily.  He continued to smoke.  He uses 2 L oxygen via nasal cannula at home.  He  had declined ICD implant in the past.  Per records his CHA2DS2-VASc score was 3 (HTN, HF, vascular).  Brilinta was discontinued in favor of DOAC for CVA prophylaxis related to atrial fib/flutter.  He was last here status post from recent hospital stay.  He felt significantly drained and tired.  He was in the hospital for approximately 10 days.  EKG showed atrial flutter with a rate of 83.Marland Kitchen  He was taking his medications as directed.  He denied any bleeding on Eliquis.  He continued to smoke.   He was have some shortness of breath and activity intolerance/DOE.  He had significant history of COPD.  Last echocardiogram showed an EF of 20 to 25%.  He had previously deferred ICD placement.  His current cardiac regimen i included; aspirin 81 mg daily, atorvastatin 80 mg daily, digoxin 0.125 mg daily, Eliquis 5 mg p.o. twice daily, Entresto 97/103 mg p.o. twice daily, Toprol-XL 200 mg daily, sublingual nitroglycerin.  He is here today for follow-up.  He and his daughter have decided they prefer a rate control strategy versus DC cardioversion.  Patient states he is still smoking.  He continues to have fatigue and activity intolerance.  EKG today demonstrates atrial flutter with variable AV block with premature ventricular or aberrantly conducted complexes rate of 79, rightward axis, anteroseptal infarct, age undetermined, ST and T wave abnormality, consider inferior ischemia.  He denies any bleeding on Eliquis.  He continues Toprol-XL 200 mg daily.  Continues Entresto [phone]  p.o. twice daily.  He continues Lasix 20 mg as needed for weight gain of 3 pounds in 24 hours or 5 pounds in 1 week.  His daughter states he has not been using Lasix very much.    Past Medical History:  Diagnosis Date   Anterior myocardial infarction (HCC) 06/2009   [name] 12/16/2016   Anxiety    Anxiety disorder    Atrial fibrillation (HCC)    Chest pain    Chronic brain syndrome    Elevated blood pressure    Tobacco abuse     Past  Surgical History:  Procedure Laterality Date   CARDIAC CATHETERIZATION  06/2009   [name] 12/16/2016   CARDIAC CATHETERIZATION N/A 12/18/2016   Procedure: Right/Left Heart Cath and Coronary/Graft Angiography;  Surgeon: Yvonne Kendall, MD;  Location: [facility] INVASIVE CV LAB;  Service: Cardiovascular;  Laterality: N/A;   CARDIAC CATHETERIZATION N/A 12/18/2016   Procedure: Coronary Stent Intervention;  Surgeon: Yvonne Kendall, MD;  Location: [facility] INVASIVE CV LAB;  Service: Cardiovascular;  Laterality: N/A;  SVG-PDA   CORONARY ANGIOPLASTY     CORONARY ARTERY BYPASS GRAFT  2010   X3   LAPAROSCOPIC CHOLECYSTECTOMY  2007    Current Outpatient Medications  Medication Sig Dispense Refill   apixaban (ELIQUIS) 5 MG TABS tablet TAKE ONE TABLET BY MOUTH TWICE DAILY 60 tablet 5   aspirin EC 81 MG tablet Take 81 mg by mouth daily.     atorvastatin (LIPITOR) 80 MG tablet TAKE ONE TABLET BY MOUTH DAILY 90 tablet 2   digoxin (LANOXIN) 0.125 MG tablet Take 125 mcg by mouth every morning.     ENTRESTO 97-103 MG TAKE ONE TABLET BY MOUTH TWICE DAILY. 180 tablet 1   furosemide (LASIX) 20 MG tablet Take 1 tablet (20 mg total) by mouth daily as needed (for weight gain of 3 lbs in 24 hours or 5 lbs in one week). 30 tablet 1   Ipratropium-Albuterol (COMBIVENT RESPIMAT) 20-100 MCG/ACT AERS respimat Inhale 1 puff into the lungs every 6 (six) hours.     metoprolol (TOPROL-XL) 200 MG 24 hr tablet TAKE ONE TABLET BY MOUTH DAILY 90 tablet 1   nitroGLYCERIN (NITROSTAT) 0.4 MG SL tablet Place 1 tablet (0.4 mg total) under the tongue every 5 (five) minutes x 3 doses as needed for chest pain (if no relief after 2nd dose, proceed to the ED for an evaluation or call 911). 25 tablet 2   PROAIR HFA 108 (90 Base) MCG/ACT inhaler Inhale 2 puffs into the lungs 4 (four) times daily as needed for shortness of breath.      TRELEGY ELLIPTA 100-62.5-25 MCG/INH AEPB Inhale 1 puff into the lungs daily.     TUSSIN DM 100-10 MG/5ML liquid Take 5 mLs  by mouth every 4 (four) hours as needed.     No current facility-administered medications for this visit.   Allergies:  Patient has no known allergies.   Social History: The patient  reports that he has been smoking cigarettes. He started smoking about 50 years ago. He has a 44.00 pack-year smoking history. He has never used smokeless tobacco. He reports current drug use. Drug: Marijuana. He reports that he does not drink alcohol.   Family History: The patient's family history includes Coronary artery disease in an other family member.   ROS:  Please see the history of present illness. Otherwise, complete review of systems is positive for none.  All other systems are reviewed and negative.   Physical Exam: VS:  BP  105/71   Pulse 79   Ht 5\' 10"  (1.778 m)   Wt 179 lb 12.8 oz (81.6 kg)   SpO2 97%   BMI 25.80 kg/m , BMI Body mass index is 25.8 kg/m.  Wt Readings from Last 3 Encounters:  08/09/21 179 lb 12.8 oz (81.6 kg)  07/12/21 179 lb 9.6 oz (81.5 kg)  04/19/21 188 lb 9.6 oz (85.5 kg)    General: Patient appears comfortable at rest. Neck: Supple, no elevated JVP or carotid bruits, no thyromegaly. Lungs: Slightly decreased sounds.  Prolonged expiratory phase., nonlabored breathing at rest. Cardiac: Irregularly irregular rate and rhythm, no S3 or significant systolic murmur, no pericardial rub. Extremities: No pitting edema, distal pulses 2+. Skin: Warm and dry. Musculoskeletal: No kyphosis. Neuropsychiatric: Alert and oriented x3, affect grossly appropriate.  ECG: 08/09/2021 EKG atrial flutter with variable AV block with premature ventricular or aberrantly conducted complexes rate of 79, rightward axis, anteroseptal infarct, age undetermined, ST and T wave abnormality, consider inferior ischemia.  Recent Labwork: No results found for requested labs within last 8760 hours.     Component Value Date/Time   CHOL (H) 06/13/2009 0504    230        ATP III CLASSIFICATION:  <200      mg/dL   Desirable  [id number]  mg/dL   Borderline High  >=213>=240    mg/dL   High          TRIG 086218 (H) 06/13/2009 0504   HDL 23 (L) 06/13/2009 0504   CHOLHDL 10.0 06/13/2009 0504   VLDL 44 (H) 06/13/2009 0504   LDLCALC (H) 06/13/2009 0504    163        Total Cholesterol/HDL:CHD Risk Coronary Heart Disease Risk Table                     Men   Women  1/2 Average Risk   3.4   3.3  Average Risk       5.0   4.4  2 X Average Risk   9.6   7.1  3 X Average Risk  23.4   11.0        Use the calculated Patient Ratio above and the CHD Risk Table to determine the patient's CHD Risk.        ATP III CLASSIFICATION (LDL):  <100     mg/dL   Optimal  [id number]  mg/dL   Near or Above                    Optimal  130-159  mg/dL   Borderline  [id number]  mg/dL   High  >413>190     mg/dL   Very High    Other Studies Reviewed Today:   [facility]    1. Technically difficult study.    2. The left ventricle is moderately dilated in size with normal wall  thickness.    3. The left ventricular systolic function is severely decreased, LVEF is  visually estimated at 20-25%.    4. There is mild mitral valve regurgitation.    5. The left atrium is moderately dilated in size.    6. The right ventricle is normal in size, with normal systolic function.    7. There is mild pulmonary hypertension, estimated pulmonary artery systolic  pressure is 46 mmHg.    8. The right atrium is mildly dilated  in size.     EXAM:  CT ANGIOGRAPHY CHEST WITH  CONTRAST [facility] 04/05/2021  TECHNIQUE:  Multidetector CT imaging of the chest was performed using the  standard protocol during bolus administration of intravenous  contrast. Multiplanar CT image reconstructions and MIPs were  obtained to evaluate the vascular anatomy.   CONTRAST:  75 mL of Omnipaque 350   COMPARISON:  None.   FINDINGS:  Cardiovascular: There is marked severity calcification of the  thoracic aorta, without  evidence of aneurysmal dilatation or  dissection. Satisfactory opacification of the pulmonary arteries to  the segmental level. No evidence of pulmonary embolism. Normal heart  size. No pericardial effusion.   Mediastinum/Nodes: No enlarged mediastinal, hilar, or axillary lymph  nodes. Thyroid gland, trachea, and esophagus demonstrate no  significant findings.   Lungs/Pleura: There is moderate severity emphysematous lung disease.   Mild to moderate severity areas of scarring and/or atelectasis are  seen within the bilateral apices.   There is no evidence of a pleural effusion or pneumothorax.   Upper Abdomen: No acute abnormality.   Musculoskeletal: Multiple sternal wires are seen.   Multilevel degenerative changes are noted throughout the thoracic  spine.   Review of the MIP images confirms the above findings.   IMPRESSION:  1. No evidence of pulmonary embolism or acute cardiopulmonary  disease.  2. Moderate severity emphysematous lung disease.  3. Evidence of prior median sternotomy/CABG.       ABI 12/17/2019 Summary: Right: Resting right ankle-brachial index indicates mild right lower extremity arterial disease. The right toebrachial index is abnormal. Left: Resting left ankle-brachial index is within normal range. No evidence of significant left lower extremity arterial disease. The left toe-brachial index is abnormal   Lower arterial duplex 12/17/2019 Summary: Right: 50-74% stenosis noted in the superficial femoral artery. Left: 50-74% stenosis noted in the superficial femoral artery.    Carotid duplex 02/16/2020 Right Carotid: Velocities in the right ICA are consistent with a 1-39% stenosis. Nonhemodynamically significant plaque <50% noted in the CCA. The ECA appears >50% stenosed. Left Carotid: Velocities in the left ICA are consistent with a 60-79% stenosis. Nonhemodynamically significant plaque <50% noted in the CCA. The ECA appears >50%  stenosed. Vertebrals: Bilateral vertebral arteries demonstrate antegrade flow. Subclavians: Bilateral subclavian artery flow was disturbed.     10/28/2017 Echocardiogram Study Conclusions   - Left ventricle: The cavity size was mildly dilated. Wall    thickness was increased in a pattern of mild LVH. Systolic    function was severely reduced. The estimated ejection fraction    was 25%. Diffuse hypokinesis. There is akinesis of the    basal-midinferior myocardium. Features are consistent with a    pseudonormal left ventricular filling pattern, with concomitant    abnormal relaxation and increased filling pressure (grade 2    diastolic dysfunction).  - Aortic valve: Mildly calcified annulus. Trileaflet.  - Mitral valve: Mildly thickened leaflets . There was trivial    regurgitation.  - Right atrium: Central venous pressure (est): 3 mm Hg.  - Tricuspid valve: There was trivial regurgitation.  - Pulmonary arteries: PA peak pressure: 25 mm Hg (S).  - Pericardium, extracardiac: There was no pericardial effusion.   Impressions:   - Mild LVH with mild chamber dilatation and LVEF approximately 25%.    Diffuse hypokinesis with mid to basal inferior akinesis. Grade 2    diastolic dysfunction. Mildly thickened mitral leaflets with    trivial mitral regurgitation. Trivial tricuspid regurgitation    with PASP 25 mmHg.     12/18/2016 Coronary Stent Intervention  Right/Left Heart  Cath and Coronary/Graft Angiography    Conclusion  Conclusions: 1.  Significant 2-vessel native coronary artery disease, including moderate diffuse proximal LAD disease and chronic total occlusion of mid LAD and ostial RCA.  70% proximal D1 stenosis is also present. 2.  Moderate, non-obstructive disease involving LCx.  Small branch of OM2 is chronically occluded. 3.  Widely patent LIMA->LAD. 4.  Patent SVG->rPDA with mild diffuse disease and discrete 95% stenosis at the distal anastomosis. 5.  Chronically  occluded SVG->D1. 6.  Mildly elevated left and right heart filling pressures, as well as moderate pulmonary hypertension. 7.  Normal to mildly reduced cardiac output. 8.  Successful PCI to distal SVG->rPDA anastomosis using a Resolute Onxy 4.0 x 12 mm drug-eluting stent with 0% residual stenosis and TIMI-3 flow.   Recommendations: 1.  Admit for overnight observation following PCI with severe systolic dysfunction. 2.  Dual antiplatelet therapy with aspirin and ticagrelor for at least 6-12 months, ideally indefinitely.  If patient experiences respiratory side effects from ticagrelor, he could be switched to clopidogrel. 3.  Avoid further IV hydration today; restart diuresis tomorrow if renal function allows. 4.  Aggressive medical therapy for severe ischemic cardiomyopathy.  If renal function stable, can consider starting ACEI tomorrow. 5.  Close outpatient follow-up with Dr. Wyline Mood   Assessment and Plan:  1. Atrial fibrillation, unspecified type (HCC)   2. Chronic diastolic heart failure (HCC)   3. CAD in native artery   4. Essential hypertension   5. Tobacco abuse   6. Chronic obstructive pulmonary disease, unspecified COPD type (HCC)   7. Bilateral carotid artery stenosis     1. Atrial fibrillation  EKG on follow-up today shows atrial flutter with variable AV block with premature ventricular or aberrantly conducted complexes rate of 79.  Rightward axis, anteroseptal infarct, age undetermined, ST and T wave normality consider inferior ischemia.  He continues Eliquis 5 mg p.o. twice daily and Toprol-XL 200 mg p.o. daily.  Patient states he does not want to pursue DC cardioversion at this time.  He and daughter mention today they are opting for rate control strategy.  2. Chronic systolic heart failure (HCC) Recent echocardiogram at [facility] healthcare on 06/03/2021 demonstrated left ventricle was moderately dilated in size with normal wall thickness.  Systolic function measured at EF of 20 to  25%.  Mild mitral regurgitation.  Left atrium moderately dilated.  RV normal size and function.  Mild pulmonary hypertension with PASP at .  Continue Lasix at 20 mg as needed for weight gain greater than 3 pounds in 24 hours or 5 pounds in 1 week.  Advised continued sodium restriction, fluid restriction of 60 cc daily.  Weigh daily.  Get a BMP and magnesium in 2 weeks.  Get a follow-up echocardiogram in 3 months.  3. CAD in native artery Denies any anginal symptoms.  Has chronic exertional dyspnea from COPD/long history of smoking.  Continue aspirin 81 mg daily.  Continue sublingual nitroglycerin as needed.    4. Essential hypertension Blood pressure today 105/71.  Continue Toprol-XL 200 mg p.o. daily.  5. Tobacco abuse Significant history of smoking greater than 50+ years per his statement.  Today patient states he has cut down on smoking but has not completely stopped.  Advised cessation if possible.  6. Chronic obstructive pulmonary disease, unspecified COPD type (HCC) History of chronic dyspnea from COPD.  Recent CT during hospital stay for COPD exacerbation demonstrated moderately severe emphysematous lung disease.  Mild to moderate severity of areas of scarring  and atelectasis are seen within the bilateral apices.  Continues smoking.  Advised cessation  7. Bilateral carotid artery stenosis Last carotid duplex study in March 2021: RICA 1 to 39% stenosis.  LICA 60 to 79% stenosis.  Follows with vascular.  He has an upcoming repeat carotid artery duplex study.   Medication Adjustments/Labs and Tests Ordered: Current medicines are reviewed at length with the patient today.  Concerns regarding medicines are outlined above.   Disposition: Follow-up with Dr. Wyline Mood or APP 6 months Signed, Rennis Harding, NP 08/09/2021 3:39 PM    [facility] at [facility] [address] [place], [place], [place] [address] Phone: [phone]; Fax: [phone]

## 2021-08-15 DIAGNOSIS — I10 Essential (primary) hypertension: Secondary | ICD-10-CM | POA: Diagnosis not present

## 2021-08-15 DIAGNOSIS — I509 Heart failure, unspecified: Secondary | ICD-10-CM | POA: Diagnosis not present

## 2021-08-21 ENCOUNTER — Other Ambulatory Visit: Payer: Self-pay | Admitting: Family Medicine

## 2021-09-02 ENCOUNTER — Emergency Department (HOSPITAL_COMMUNITY): Payer: Medicare Other

## 2021-09-02 ENCOUNTER — Encounter (HOSPITAL_COMMUNITY): Payer: Self-pay

## 2021-09-02 ENCOUNTER — Emergency Department (HOSPITAL_COMMUNITY)
Admission: EM | Admit: 2021-09-02 | Discharge: 2021-09-03 | Discharge status: Against Medical Advice | Payer: Medicare Other | Attending: Emergency Medicine | Admitting: Emergency Medicine

## 2021-09-02 ENCOUNTER — Other Ambulatory Visit: Payer: Self-pay

## 2021-09-02 ENCOUNTER — Other Ambulatory Visit: Payer: Self-pay | Admitting: Cardiology

## 2021-09-02 DIAGNOSIS — I4891 Unspecified atrial fibrillation: Secondary | ICD-10-CM | POA: Diagnosis not present

## 2021-09-02 DIAGNOSIS — J449 Chronic obstructive pulmonary disease, unspecified: Secondary | ICD-10-CM | POA: Diagnosis not present

## 2021-09-02 DIAGNOSIS — Z951 Presence of aortocoronary bypass graft: Secondary | ICD-10-CM | POA: Insufficient documentation

## 2021-09-02 DIAGNOSIS — F17210 Nicotine dependence, cigarettes, uncomplicated: Secondary | ICD-10-CM | POA: Diagnosis not present

## 2021-09-02 DIAGNOSIS — R0602 Shortness of breath: Secondary | ICD-10-CM | POA: Insufficient documentation

## 2021-09-02 DIAGNOSIS — Z79899 Other long term (current) drug therapy: Secondary | ICD-10-CM | POA: Diagnosis not present

## 2021-09-02 DIAGNOSIS — R61 Generalized hyperhidrosis: Secondary | ICD-10-CM | POA: Diagnosis not present

## 2021-09-02 DIAGNOSIS — R0682 Tachypnea, not elsewhere classified: Secondary | ICD-10-CM | POA: Diagnosis not present

## 2021-09-02 DIAGNOSIS — Z20822 Contact with and (suspected) exposure to covid-19: Secondary | ICD-10-CM | POA: Insufficient documentation

## 2021-09-02 DIAGNOSIS — Z7901 Long term (current) use of anticoagulants: Secondary | ICD-10-CM | POA: Insufficient documentation

## 2021-09-02 DIAGNOSIS — I2510 Atherosclerotic heart disease of native coronary artery without angina pectoris: Secondary | ICD-10-CM | POA: Insufficient documentation

## 2021-09-02 DIAGNOSIS — J439 Emphysema, unspecified: Secondary | ICD-10-CM | POA: Diagnosis not present

## 2021-09-02 DIAGNOSIS — Z7982 Long term (current) use of aspirin: Secondary | ICD-10-CM | POA: Diagnosis not present

## 2021-09-02 DIAGNOSIS — R062 Wheezing: Secondary | ICD-10-CM | POA: Diagnosis not present

## 2021-09-02 LAB — CBC WITH DIFFERENTIAL/PLATELET
Abs Immature Granulocytes: 0.06 10*3/uL (ref 0.00–0.07)
Basophils Absolute: 0 10*3/uL (ref 0.0–0.1)
Basophils Relative: 0 %
Eosinophils Absolute: 0.1 10*3/uL (ref 0.0–0.5)
Eosinophils Relative: 1 %
HCT: 44.9 % (ref 39.0–52.0)
Hemoglobin: 14.1 g/dL (ref 13.0–17.0)
Immature Granulocytes: 1 %
Lymphocytes Relative: 8 %
Lymphs Abs: 0.9 10*3/uL (ref 0.7–4.0)
MCH: 30.9 pg (ref 26.0–34.0)
MCHC: 31.4 g/dL (ref 30.0–36.0)
MCV: 98.2 fL (ref 80.0–100.0)
Monocytes Absolute: 0.2 10*3/uL (ref 0.1–1.0)
Monocytes Relative: 2 %
Neutro Abs: 9.1 10*3/uL — ABNORMAL HIGH (ref 1.7–7.7)
Neutrophils Relative %: 88 %
Platelets: 220 10*3/uL (ref 150–400)
RBC: 4.57 MIL/uL (ref 4.22–5.81)
RDW: 13.9 % (ref 11.5–15.5)
WBC: 10.3 10*3/uL (ref 4.0–10.5)
nRBC: 0 % (ref 0.0–0.2)

## 2021-09-02 LAB — RESP PANEL BY RT-PCR (FLU A&B, COVID) ARPGX2
Influenza A by PCR: NEGATIVE
Influenza B by PCR: NEGATIVE
SARS Coronavirus 2 by RT PCR: NEGATIVE

## 2021-09-02 LAB — COMPREHENSIVE METABOLIC PANEL
ALT: 24 U/L (ref 0–44)
AST: 20 U/L (ref 15–41)
Albumin: 4.3 g/dL (ref 3.5–5.0)
Alkaline Phosphatase: 102 U/L (ref 38–126)
Anion gap: 6 (ref 5–15)
BUN: 18 mg/dL (ref 8–23)
CO2: 34 mmol/L — ABNORMAL HIGH (ref 22–32)
Calcium: 9.3 mg/dL (ref 8.9–10.3)
Chloride: 101 mmol/L (ref 98–111)
Creatinine, Ser: 1.09 mg/dL (ref 0.61–1.24)
GFR, Estimated: >60 mL/min (ref >60)
Glucose, Bld: 140 mg/dL — ABNORMAL HIGH (ref 70–99)
Potassium: 4.9 mmol/L (ref 3.5–5.1)
Sodium: 141 mmol/L (ref 135–145)
Total Bilirubin: 0.4 mg/dL (ref 0.3–1.2)
Total Protein: 7.5 g/dL (ref 6.5–8.1)

## 2021-09-02 MED ORDER — IPRATROPIUM-ALBUTEROL 0.5-2.5 (3) MG/3ML IN SOLN
3.0000 mL | Freq: Once | RESPIRATORY_TRACT | Status: AC
  Administered 2021-09-03: 3 mL via RESPIRATORY_TRACT
  Filled 2021-09-02: qty 3

## 2021-09-02 NOTE — ED Triage Notes (Addendum)
Pt BIB [facility] with c/o SOB that started yesterday. EMS found pt to be 90% on 2.5L [place]. After duoneb treatment pt came up to 99%. Pt received 120 of solumedrol. Pt is 95% on RA in triage  Pt has hx of COPD

## 2021-09-02 NOTE — ED Provider Notes (Signed)
[facility] EMERGENCY DEPARTMENT Provider Note   CSN: [id number] Arrival date & time: 09/02/21  1914     History Chief Complaint  Patient presents with   Shortness of Breath    Dave [name] is a 65 y.o. male.  Patient is a 65 year old male with extensive past medical history including coronary artery disease with CABG and stents, ischemic cardiomyopathy, COPD, atrial fibrillation on Eliquis.  Patient presenting today for evaluation of shortness of breath.  This began earlier today.  Patient denies experiencing any chest pain, fevers, cough, or increased leg swelling.  He reports being compliant with his medications.  He denies any ill contacts.  He received aspirin, albuterol, and Solu-Medrol by EMS in route to the hospital.  The history is provided by the patient.  Shortness of Breath Severity:  Moderate Onset quality:  Sudden Duration:  1 day Timing:  Constant Progression:  Worsening Chronicity:  Recurrent Relieved by:  Nothing Worsened by:  Nothing Associated symptoms: no fever       Past Medical History:  Diagnosis Date   Anterior myocardial infarction (HCC) 06/2009   [name] 12/16/2016   Anxiety    Anxiety disorder    Atrial fibrillation (HCC)    Chest pain    Chronic brain syndrome    Elevated blood pressure    Tobacco abuse     Patient Active Problem List   Diagnosis Date Noted   Shortness of breath 12/18/2016   Acute systolic heart failure (HCC) 12/18/2016   CARDIOMYOPATHY, ISCHEMIC 12/28/2009   CAROTID BRUIT 12/28/2009   NONDEPENDENT TOBACCO USE DISORDER 12/26/2009   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/26/2009   Cardiomyopathy (HCC) 12/26/2009   PAROXYSMAL VENTRICULAR TACHYCARDIA 12/26/2009   PRECORDIAL PAIN 12/26/2009    Past Surgical History:  Procedure Laterality Date   CARDIAC CATHETERIZATION  06/2009   [name] 12/16/2016   CARDIAC CATHETERIZATION N/A 12/18/2016   Procedure: Right/Left Heart Cath and Coronary/Graft Angiography;   Surgeon: Yvonne Kendall, MD;  Location: [facility] INVASIVE CV LAB;  Service: Cardiovascular;  Laterality: N/A;   CARDIAC CATHETERIZATION N/A 12/18/2016   Procedure: Coronary Stent Intervention;  Surgeon: Yvonne Kendall, MD;  Location: [facility] INVASIVE CV LAB;  Service: Cardiovascular;  Laterality: N/A;  SVG-PDA   CORONARY ANGIOPLASTY     CORONARY ARTERY BYPASS GRAFT  2010   X3   LAPAROSCOPIC CHOLECYSTECTOMY  2007       Family History  Problem Relation Age of Onset   Coronary artery disease Other     Social History   Tobacco Use   Smoking status: Every Day    Packs/day: 1.00    Years: 44.00    Pack years: 44.00    Types: Cigarettes    Start date: 02/07/1971   Smokeless tobacco: Never   Tobacco comments:    1 pack per day  Vaping Use   Vaping Use: Never used  Substance Use Topics   Alcohol use: No   Drug use: Yes    Types: Marijuana    Comment: 12/18/2016 "experimented when I was younger with different things; now only smoke marijuana"    Home Medications Prior to Admission medications   Medication Sig Start Date End Date Taking? Authorizing Provider  apixaban (ELIQUIS) 5 MG TABS tablet TAKE ONE TABLET BY MOUTH TWICE DAILY 07/02/21   Antoine Poche, MD  aspirin EC 81 MG tablet Take 81 mg by mouth daily.    [provider]  atorvastatin (LIPITOR) 80 MG tablet TAKE ONE TABLET BY  MOUTH DAILY 07/02/21   Netta Neat., NP  digoxin (LANOXIN) 0.125 MG tablet Take 125 mcg by mouth every morning. 07/02/21   [provider]  ENTRESTO 97-103 MG TAKE ONE TABLET BY MOUTH TWICE DAILY. 07/10/21   Antoine Poche, MD  furosemide (LASIX) 20 MG tablet TAKE ONE TABLET BY MOUTH ONCE DAILY AS NEEDED (FOR WEIGHT GAIN OF 3 POUNDS IN 24 HOURS OR 5 POUNDS IN ONE WEEK) 08/21/21   Antoine Poche, MD  Ipratropium-Albuterol (COMBIVENT RESPIMAT) 20-100 MCG/ACT AERS respimat Inhale 1 puff into the lungs every 6 (six) hours.    [provider]  metoprolol (TOPROL-XL) 200 MG 24 hr  tablet TAKE ONE TABLET BY MOUTH DAILY 06/11/21   Antoine Poche, MD  nitroGLYCERIN (NITROSTAT) 0.4 MG SL tablet DISSOLVE ONE TABLET UNDER TONGUE EVERY 5 MINUTES UP TO 3 DOSES AS NEEDED FOR CHEST PAIN 09/02/21   Antoine Poche, MD  PROAIR HFA 108 [phone] Base) MCG/ACT inhaler Inhale 2 puffs into the lungs 4 (four) times daily as needed for shortness of breath.  11/08/16   [provider]  TRELEGY ELLIPTA 100-62.5-25 MCG/INH AEPB Inhale 1 puff into the lungs daily. 07/11/21   [provider]  TUSSIN DM 100-10 MG/5ML liquid Take 5 mLs by mouth every 4 (four) hours as needed. 06/08/21   [provider]    Allergies    Chocolate hazelnut flavor  Review of Systems   Review of Systems  Constitutional:  Negative for fever.  Respiratory:  Positive for shortness of breath.   All other systems reviewed and are negative.  Physical Exam Updated Vital Signs BP (!) 144/63   Pulse (!) 59   Temp 98.5 F (36.9 C) (Oral)   Resp (!) 22   Ht 5\' 10"  (1.778 m)   Wt 83.9 kg   SpO2 95%   BMI 26.54 kg/m   Physical Exam Vitals and nursing note reviewed.  Constitutional:      General: He is not in acute distress.    Appearance: He is well-developed. He is not diaphoretic.  HENT:     Head: Normocephalic and atraumatic.  Cardiovascular:     Rate and Rhythm: Normal rate and regular rhythm.     Heart sounds: No murmur heard.   No friction rub.  Pulmonary:     Effort: Pulmonary effort is normal. Tachypnea present. No accessory muscle usage or respiratory distress.     Breath sounds: Examination of the right-middle field reveals rhonchi. Examination of the left-middle field reveals rhonchi. Rhonchi present. No wheezing or rales.  Abdominal:     General: Bowel sounds are normal. There is no distension.     Palpations: Abdomen is soft.     Tenderness: There is no abdominal tenderness.  Musculoskeletal:        General: Normal range of motion.     Cervical back: Normal range of  motion and neck supple.     Right lower leg: No tenderness. No edema.     Left lower leg: No tenderness. No edema.  Skin:    General: Skin is warm and dry.  Neurological:     Mental Status: He is alert and oriented to person, place, and time.     Coordination: Coordination normal.    ED Results / Procedures / Treatments   Labs (all labs ordered are listed, but only abnormal results are displayed) Labs Reviewed  CBC WITH DIFFERENTIAL/PLATELET - Abnormal; Notable for the following components:  Result Value   Neutro Abs 9.1 (*)    All other components within normal limits  COMPREHENSIVE METABOLIC PANEL - Abnormal; Notable for the following components:   CO2 34 (*)    Glucose, Bld 140 (*)    All other components within normal limits  RESP PANEL BY RT-PCR (FLU A&B, COVID) [id number]  BRAIN NATRIURETIC PEPTIDE  TROPONIN I (HIGH SENSITIVITY)    EKG None  Radiology DG Chest Port 1 View  Result Date: 09/02/2021 CLINICAL DATA:  Shortness of breath. EXAM: PORTABLE CHEST 1 VIEW COMPARISON:  Chest x-ray 05/30/2021, CT chest 04/05/2021 FINDINGS: The heart and mediastinal contours are unchanged. Aortic calcification. Hyperinflation of the lungs. No focal consolidation. Coarsened interstitial markings with no overt pulmonary edema. No pleural effusion. No pneumothorax. No acute osseous abnormality. IMPRESSION: 1. No active disease. 2. Aortic Atherosclerosis (ICD10-I70.0) and Emphysema (ICD10-J43.9). Electronically Signed   By: Tish Frederickson M.D.   On: 09/02/2021 22:59    Procedures Procedures   Medications Ordered in ED Medications  ipratropium-albuterol (DUONEB) 0.5-2.5 (3) MG/3ML nebulizer solution 3 mL (has no administration in time range)  ipratropium-albuterol (DUONEB) 0.5-2.5 (3) MG/3ML nebulizer solution 3 mL (has no administration in time range)    ED Course  I have reviewed the triage vital signs and the nursing notes.  Pertinent labs & imaging results that were available  during my care of the patient were reviewed by me and considered in my medical decision making (see chart for details).    MDM Rules/Calculators/A&P  Patient presenting here with shortness of breath that started yesterday.  He was brought by EMS and received DuoNeb and Solu-Medrol in route.  I evaluated patient upon start of my shift after an extended wait.  I informed the patient of my intention to order additional laboratory studies and give additional breathing treatment, then excused myself from the room.  I was later notified the patient informed the nurses he was leaving and left the emergency department.  This was not brought to my attention till the patient was already gone.  Final Clinical Impression(s) / ED Diagnoses Final diagnoses:  None    Rx / DC Orders ED Discharge Orders     None        Geoffery Lyons, MD 09/03/21 0425

## 2021-09-03 DIAGNOSIS — R0602 Shortness of breath: Secondary | ICD-10-CM | POA: Diagnosis not present

## 2021-09-03 LAB — BRAIN NATRIURETIC PEPTIDE: B Natriuretic Peptide: 415 pg/mL — ABNORMAL HIGH (ref 0.0–100.0)

## 2021-09-03 LAB — TROPONIN I (HIGH SENSITIVITY): Troponin I (High Sensitivity): 21 ng/L — ABNORMAL HIGH (ref <18)

## 2021-09-03 NOTE — ED Notes (Signed)
Pt states he is leaving, RN educated pt on risks. EDP made aware

## 2021-09-05 ENCOUNTER — Ambulatory Visit (INDEPENDENT_AMBULATORY_CARE_PROVIDER_SITE_OTHER): Payer: Medicare Other

## 2021-09-05 DIAGNOSIS — I6523 Occlusion and stenosis of bilateral carotid arteries: Secondary | ICD-10-CM

## 2021-09-10 ENCOUNTER — Telehealth: Payer: Self-pay | Admitting: *Deleted

## 2021-09-10 NOTE — Telephone Encounter (Signed)
-----   Message from Lesle Chris, LPN sent at [id number]/01/[phone]  8:42 AM EDT -----  ----- Message ----- From: Netta Neat., NP Sent: 09/07/2021  10:46 AM EDT To: Lesle Chris, LPN  Please call the patient and let him know the carotid study showed his carotid artery stenosis is unchanged from last year. His left carotid artery stenosis is 60-79%  and his right  coronary artery continues at 1-39%. We will continue to watch with yearly studies. Go ahead and schedule a repeat study next year around the end of September or first of October. Thanks   Netta Neat, NP  09/07/2021 10:42 AM

## 2021-09-11 NOTE — Telephone Encounter (Signed)
Patient's daughter Rolly Salter informed. Copy sent to PCP

## 2021-09-17 ENCOUNTER — Other Ambulatory Visit (HOSPITAL_COMMUNITY): Payer: Self-pay | Admitting: Family Medicine

## 2021-09-17 DIAGNOSIS — I6523 Occlusion and stenosis of bilateral carotid arteries: Secondary | ICD-10-CM

## 2021-09-18 DIAGNOSIS — J449 Chronic obstructive pulmonary disease, unspecified: Secondary | ICD-10-CM | POA: Diagnosis not present

## 2021-09-18 DIAGNOSIS — Z6825 Body mass index (BMI) 25.0-25.9, adult: Secondary | ICD-10-CM | POA: Diagnosis not present

## 2021-09-18 DIAGNOSIS — R062 Wheezing: Secondary | ICD-10-CM | POA: Diagnosis not present

## 2021-09-18 DIAGNOSIS — Z299 Encounter for prophylactic measures, unspecified: Secondary | ICD-10-CM | POA: Diagnosis not present

## 2021-09-18 DIAGNOSIS — Z23 Encounter for immunization: Secondary | ICD-10-CM | POA: Diagnosis not present

## 2021-09-18 DIAGNOSIS — R0602 Shortness of breath: Secondary | ICD-10-CM | POA: Diagnosis not present

## 2021-09-19 ENCOUNTER — Ambulatory Visit: Admit: 2021-09-19 | Payer: BLUE CROSS/BLUE SHIELD | Attending: Family Medicine | Primary: Family Medicine

## 2021-09-19 ENCOUNTER — Inpatient Hospital Stay: Admit: 2021-09-19 | Payer: BLUE CROSS/BLUE SHIELD | Primary: Family Medicine

## 2021-09-19 ENCOUNTER — Ambulatory Visit: Attending: Family Medicine | Primary: Family Medicine

## 2021-09-19 DIAGNOSIS — Z1211 Encounter for screening for malignant neoplasm of colon: Secondary | ICD-10-CM

## 2021-09-19 DIAGNOSIS — M25552 Pain in left hip: Secondary | ICD-10-CM

## 2021-09-19 NOTE — Progress Notes (Signed)
SUBJECTIVE:   [name] is a 65 y.o. male who is here for follow up of routine medical issues.      Abdominal Pain: Pt presents with LLQ pain, which has worsened over time. Pt reports that the pain is greatest in the morning when he wakes up, but notes that it does not prevent him from rolling over in bed. The pain worsens when going from seated positions to standing. He denies significant pain when walking, and describes it as a persistent nagging pain. Pt is unsure of any injuries to the area. He is restricted from lifting heavy objects and walks for exercise.He denies back pains, fatigue, changes in bowel movements, hematuria, melena, cramping during bowel movements, hematuria, inconsistent flow, difficulty voiding, or general changes in urinary habits.     PREVENTIVE:  Colonoscopy: 12/24/2016 (repeat in 5 years)  Flu: 09/19/2021  Shingles: due  COVID: booster due  PSA: due  Eye Exam: due      At this time, he is otherwise doing well and has brought no other complaints to my attention today.  For a list of the medical issues addressed today, see the assessment and plan below.    PMH:   Past Medical History:   Diagnosis Date    CAD (coronary artery disease)     heart attack 2011    Chronic obstructive pulmonary disease (HCC)     Hypercholesterolemia     Hypertension      PSH:  has a past surgical history that includes pr cardiac surg procedure unlist and hx other surgical (02/05/2018).    All: has No Known Allergies.   MEDS:   Current Outpatient Medications   Medication Sig    metFORMIN ER (GLUCOPHAGE XR) 500 mg tablet TAKE 1 TABLET BY MOUTH EVERY DAY    Farxiga 10 mg tab tablet     famotidine (PEPCID) 40 mg tablet     Anoro Ellipta 62.5-25 mcg/actuation inhaler     alfuzosin SR (UROXATRAL) 10 mg SR tablet Take 1 Tab by mouth daily. Indications: enlarged prostate with urination problem    glucose blood VI test strips (Accu-Chek SmartView Test Strip) strip Test blood sugar every morning fasting.     amLODIPine (NORVASC) 5 mg tablet     cyclobenzaprine (FLEXERIL) 10 mg tablet TAKE 1 TABLET BY MOUTH THREE TIMES A DAY AS NEEDED FOR MUSCLE SPASMS    clopidogrel (PLAVIX) 75 mg tab Take 75 mg by mouth daily.    rosuvastatin (CRESTOR) 20 mg tablet Take 1 Tab by mouth nightly.    lisinopril (PRINIVIL, ZESTRIL) 10 mg tablet Take 1 Tab by mouth daily.    glucose blood VI test strips strip by Does Not Apply route See Admin Instructions.    metoprolol succinate (TOPROL-XL) 50 mg XL tablet Take 50 mg by mouth daily.    omega-3 fatty acids-vitamin e 1,000 mg cap Take 1 Cap by mouth daily.    aspirin delayed-release 81 mg tablet Take 81 mg by mouth daily.    multivitamin (ONE A DAY) tablet Take 1 Tab by mouth daily.    glucosamine-chondroitin (ARTHX) 500-400 mg cap Take 1 Cap by mouth daily.     No current facility-administered medications for this visit.       FH: family history includes Diabetes in his brother; Heart Disease in his father; Hypertension in his mother.   SH:  reports that he quit smoking about 4 years ago. His smoking use included cigarettes. He has a 80.00 pack-year  smoking history. He has never used smokeless tobacco. He reports that he does not drink alcohol and does not use drugs.     Review of Systems - History obtained from the patient  General ROS: no fever, chills, fatigue, body aches  Psychological ROS: no change in anxiety, depression, SI/HI  Ophthalmic ROS: no blurred vision, myopia, double vision  ENT ROS: no dysphagia, otalgia, otorrhea, rhinorrhea, post nasal drip  Respiratory ROS: no cough, shortness of breath, or wheezing  Cardiovascular ROS: no chest pain or dyspnea on exertion  Gastrointestinal ROS: no change in bowel habits, or black or bloody stools, +LLQ pain  Genito-Urinary ROS: no frequency, urgency, incontinence, dysuria, hematouria  Musculoskeletal ROS: no arthralagia, myalgia  Neurological ROS: no headaches, dizziness, lightheadedness, tremors, seizures  Dermatological ROS: no rash  or lesions    OBJECTIVE:   Vitals: Visit Vitals  BP 104/64 (BP 1 Location: Left upper arm, BP Patient Position: Sitting, BP Cuff Size: Adult)   Pulse 64   Temp 97.4 ??F (36.3 ??C) (Temporal)   Resp 16   Ht 5\' 10"  (1.778 m)   Wt 227 lb (103 kg)   SpO2 95%   BMI 32.57 kg/m??      Gen: Pleasant 65 y.o.  male in NAD.    HEENT: PERRLA. EOMI. OP moist and pink.    Neck: Supple.  No LAD.   HEART: RRR, No M/G/R.      LUNGS: CTAB No W/R.    ABDOMEN: S, NT, ND, BS+. +left hip tenderness  EXTREMITIES: Warm. No C/C/E.    MUSCULOSKELETAL: Normal ROM, muscle strength 5/5 all groups.    NEURO: Alert and oriented x 3.  Cranial nerves grossly intact.  No focal sensory or motor deficits noted.   SKIN: Warm. Dry. No rashes or other lesions noted.    ASSESSMENT/ PLAN: Diagnoses and all orders for this visit:    1. Colon cancer screening  -     REFERRAL TO GASTROENTEROLOGY    2. Controlled type 2 diabetes mellitus without complication, without long-term current use of insulin (HCC)  -     METABOLIC PANEL, COMPREHENSIVE; Future  -     HEMOGLOBIN A1C WITH EAG; Future    3. Lower abdominal pain  -     CT ABDOMEN W WO CONT AND PELVIS W CONT; Future    4. Left hip pain  -     XR HIP LT W OR WO PELV 2-3 VWS; Future    5. Prostate cancer screening  -     PSA SCREENING (SCREENING); Future      1. Colon cancer screening  Pt's last colonoscopy was 12/2015 and he is due for another in 2023. I have referred him to Dr.Duckworth, GI.  -     REFERRAL TO GASTROENTEROLOGY    2. Controlled type 2 diabetes mellitus without complication, without long-term current use of insulin (HCC)  Pt's blood sugar seems to be well controlled. I advised pt to continue current dose of medications, avoid sugars and starches, and to increase exercise when possible. Recheck hemoglobin A1c and CMP.     -     METABOLIC PANEL, COMPREHENSIVE; Future  -     HEMOGLOBIN A1C WITH EAG; Future    3. Lower abdominal pain  I have ordered a CT abdomen for his abdominal pain.  -     CT  ABDOMEN W WO CONT AND PELVIS W CONT; Future    4. Left hip pain  I have ordered  an x-ray for his hip.  -     XR HIP LT W OR WO PELV 2-3 VWS; Future    5. Prostate cancer screening  Pt is due for a PSA screening so I have placed an order.  -     PSA SCREENING (SCREENING); Future    ICD-10-CM ICD-9-CM    1. Colon cancer screening  Z12.11 V76.51 REFERRAL TO GASTROENTEROLOGY      2. Controlled type 2 diabetes mellitus without complication, without long-term current use of insulin (HCC)  E11.9 250.00 METABOLIC PANEL, COMPREHENSIVE      HEMOGLOBIN A1C WITH EAG      METABOLIC PANEL, COMPREHENSIVE      HEMOGLOBIN A1C WITH EAG      3. Lower abdominal pain  R10.30 789.09 CT ABDOMEN W WO CONT AND PELVIS W CONT      4. Left hip pain  M25.552 719.45 XR HIP LT W OR WO PELV 2-3 VWS      5. Prostate cancer screening  Z12.5 V76.44 PSA SCREENING (SCREENING)      PSA SCREENING (SCREENING)               I have reviewed the patient's medications and risks/side effects/benefits were discussed. Diagnosis(-es) explained to patient and questions answered. Literature provided where appropriate.     Written by Rosina Lowenstein, [employer], as dictated by Jackolyn Confer, MD.

## 2021-09-19 NOTE — Progress Notes (Signed)
Hip x-ray: No significant arthritis is noted in the left hip.

## 2021-09-19 NOTE — Progress Notes (Signed)
ZUI:VGTJOQ electrolyte levels except for an elevation in the glucose level, normal renal and liver function   A1c: Your hemoglobin A1c is stable in the diabetic range.  Your current A1c is 7.8 and the prior reading almost a year ago was 7.9  PSA: Normal range

## 2021-09-19 NOTE — Progress Notes (Signed)
1. "Have you been to the ER, urgent care clinic since your last visit?  Hospitalized since your last visit?" Yes [facility]    2. "Have you seen or consulted any other health care providers outside of the [facility] since your last visit?" no     3. For patients aged 63-75: Has the patient had a colonoscopy / FIT/ Cologuard? Yes - Care Gap present. Most recent result on file      If the patient is male:    4. For patients aged 35-74: Has the patient had a mammogram within the past 2 years? NA - based on age or sex      38. For patients aged 21-65: Has the patient had a pap smear? NA - based on age or sex

## 2021-09-20 LAB — COMPREHENSIVE METABOLIC PANEL
ALT: 13 IU/L (ref 0–44)
AST: 14 IU/L (ref 0–40)
Albumin/Globulin Ratio: 2.6 NA — ABNORMAL HIGH (ref 1.2–2.2)
Albumin: 4.9 g/dL — ABNORMAL HIGH (ref 3.8–4.8)
Alkaline Phosphatase: 46 IU/L (ref 44–121)
BUN: 17 mg/dL (ref 8–27)
Bun/Cre Ratio: 21 NA (ref 10–24)
CO2: 22 mmol/L (ref 20–29)
Calcium: 9.4 mg/dL (ref 8.6–10.2)
Chloride: 103 mmol/L (ref 96–106)
Creatinine: 0.82 mg/dL (ref 0.76–1.27)
Est, Glomerular Filtration Rate: 99 mL/min/{1.73_m2} (ref >59)
Globulin, Total: 1.9 g/dL (ref 1.5–4.5)
Glucose: 142 mg/dL — ABNORMAL HIGH (ref 70–99)
Potassium: 4.8 mmol/L (ref 3.5–5.2)
Sodium: 139 mmol/L (ref 134–144)
Total Bilirubin: 0.4 mg/dL (ref 0.0–1.2)
Total Protein: 6.8 g/dL (ref 6.0–8.5)

## 2021-09-20 LAB — PSA SCREENING: PSA: 1 ng/mL (ref 0.0–4.0)

## 2021-09-20 LAB — HEMOGLOBIN A1C W/EAG
Hemoglobin A1C: 7.8 % — ABNORMAL HIGH (ref 4.8–5.6)
eAG: 177 mg/dL

## 2021-09-20 LAB — METABOLIC PANEL, COMPREHENSIVE
A-G Ratio: 2.6 — ABNORMAL HIGH (ref 1.2–2.2)
ALT (SGPT): 13 IU/L (ref 0–44)
AST (SGOT): 14 IU/L (ref 0–40)
Albumin: 4.9 g/dL — ABNORMAL HIGH (ref 3.8–4.8)
Alk. phosphatase: 46 IU/L (ref 44–121)
BUN/Creatinine ratio: 21 (ref 10–24)
BUN: 17 mg/dL (ref 8–27)
Bilirubin, total: 0.4 mg/dL (ref 0.0–1.2)
CO2: 22 mmol/L (ref 20–29)
Calcium: 9.4 mg/dL (ref 8.6–10.2)
Chloride: 103 mmol/L (ref 96–106)
Creatinine: 0.82 mg/dL (ref 0.76–1.27)
GLOBULIN, TOTAL: 1.9 g/dL (ref 1.5–4.5)
Glucose: 142 mg/dL — ABNORMAL HIGH (ref 70–99)
Potassium: 4.8 mmol/L (ref 3.5–5.2)
Protein, total: 6.8 g/dL (ref 6.0–8.5)
Sodium: 139 mmol/L (ref 134–144)
eGFR: 99 mL/min/{1.73_m2} (ref >59)

## 2021-09-20 LAB — HEMOGLOBIN A1C WITH EAG
Estimated average glucose: 177 mg/dL
Hemoglobin A1c: 7.8 % — ABNORMAL HIGH (ref 4.8–5.6)

## 2021-09-20 LAB — PSA SCREENING (SCREENING): Prostate Specific Ag: 1 ng/mL (ref 0.0–4.0)

## 2021-10-17 ENCOUNTER — Emergency Department: Admit: 2021-10-17 | Payer: BLUE CROSS/BLUE SHIELD | Primary: Family Medicine

## 2021-10-17 ENCOUNTER — Ambulatory Visit: Payer: BLUE CROSS/BLUE SHIELD | Primary: Family Medicine

## 2021-10-17 ENCOUNTER — Inpatient Hospital Stay
Admit: 2021-10-17 | Discharge: 2021-10-17 | Disposition: A | Discharge status: Home / Self Care | Payer: BLUE CROSS/BLUE SHIELD | Attending: Emergency Medicine

## 2021-10-17 NOTE — ED Provider Notes (Signed)
ED Provider Notes by Alwyn Ren, PA at 10/17/21 1622                Author: Alwyn Ren, PA  Service: EMERGENCY  Author Type: Physician Assistant       Filed: 10/17/21 1627  Date of Service: 10/17/21 1622  Status: Attested           Editor: Alwyn Ren, PA (Physician Assistant)  Cosigner: Domingo Cocking, MD at 10/20/21 0155          Attestation signed by Domingo Cocking, MD at 10/20/21 0155          I was personally available for consultation in the emergency department.  I have reviewed the chart and agree with the documentation recorded by the [facility], including  the assessment, treatment plan, and disposition.   Domingo Cocking, MD                                 Please note that this dictation was completed with Dragon, the computer voice recognition software.  Quite often unanticipated  grammatical, syntax, homophones, and other interpretive errors are inadvertently transcribed by the computer software.  Please disregard these errors.  Please excuse any errors that have escaped final proofreading.         Patient is a 65 year old male history of CAD, hyperlipidemia, hypertension, presenting to ED for left-sided elbow pain and swelling.  He states that the pain began several weeks ago but swelling started within the last several days.  He denies any trauma  or injury, but states that he did a lot of lifting and use of his arm over the weekend.  He denies fever, chills, skin changes.  States that he spoke with his brother who is a PCP who thought it was bursitis and recommended him to be evaluated for possible  drainage.  Has no history of similar symptoms.  Denies history of gout.            Past Medical History:        Diagnosis  Date         ?  CAD (coronary artery disease)            heart attack 2011         ?  Chronic obstructive pulmonary disease (HCC)       ?  Hypercholesterolemia           ?  Hypertension               Past Surgical History:         Procedure  Laterality  Date           ?  HX OTHER SURGICAL    02/05/2018          stent in heart           ?  PR CARDIAC SURG PROCEDURE UNLIST              heart stint placement 2011               Family History:         Problem  Relation  Age of Onset          ?  Hypertension  Mother       ?  Heart Disease  Father            ?  Diabetes  Brother               Social History          Socioeconomic History         ?  Marital status:  DIVORCED              Spouse name:  Not on file         ?  Number of children:  Not on file     ?  Years of education:  Not on file     ?  Highest education level:  Not on file       Occupational History        ?  Not on file       Tobacco Use         ?  Smoking status:  Former              Packs/day:  2.00         Years:  40.00         Pack years:  80.00         Types:  Cigarettes         Quit date:  02/16/2017         Years since quitting:  4.6         ?  Smokeless tobacco:  Never       Substance and Sexual Activity         ?  Alcohol use:  No     ?  Drug use:  No             Comment: History of cocaine use         ?  Sexual activity:  Not Currently              Partners:  Female        Other Topics  Concern        ?  Not on file       Social History Narrative        ?  Not on file          Social Determinants of Health          Financial Resource Strain: Low Risk         ?  Difficulty of Paying Living Expenses: Not hard at all       Food Insecurity: No Food Insecurity        ?  Worried About Programme researcher, broadcasting/film/video in the Last Year: Never true     ?  Ran Out of Food in the Last Year: Never true       Transportation Needs: Not on file     Physical Activity: Not on file     Stress: Not on file     Social Connections: Not on file     Intimate Partner Violence: Not on file       Housing Stability: Not on file              ALLERGIES: Patient has no known allergies.      Review of Systems    Constitutional:  Negative for chills and fever.    HENT:  Negative for congestion, ear pain and sore throat.     Eyes:  Negative for visual  disturbance.    Respiratory:  Negative for cough and shortness of breath.     Cardiovascular:  Negative for chest pain.  Gastrointestinal:  Negative for abdominal pain, diarrhea, nausea and vomiting.    Genitourinary:  Negative for dysuria and flank pain.    Musculoskeletal:  Positive for arthralgias and joint swelling . Negative for back pain.    Skin:  Negative for color change.    Neurological:  Negative for dizziness and headaches.    Psychiatric/Behavioral:  Negative for confusion.          Vitals:          10/17/21 1309        BP:  (!) 140/82     Pulse:  86     Resp:  16     Temp:  97.6 ??F (36.4 ??C)     SpO2:  100%     Weight:  99.8 kg (220 lb)        Height:  5\' 10"  (1.778 m)                Physical Exam   Vitals and nursing note reviewed.    Constitutional:        General: He is not in acute distress.      Appearance: Normal appearance. He is not ill-appearing.    HENT:       Head: Normocephalic and atraumatic.    Eyes:       General: Vision grossly intact.       Extraocular Movements: Extraocular movements intact.       Conjunctiva/sclera: Conjunctivae normal.    Neck:       Trachea: Phonation normal.    Cardiovascular:       Rate and Rhythm: Normal rate and regular rhythm.       Heart sounds: Normal heart sounds.    Pulmonary:       Effort: Pulmonary effort is normal.       Breath sounds: Normal breath sounds and air entry .     Abdominal:       Palpations: Abdomen is soft.       Tenderness: There is no abdominal tenderness.     Musculoskeletal:          General: Normal range of motion.       Left upper arm: Normal.       Left elbow: Swelling (localized swelling overyling olecranon. Mild tenderness. No erythema, warmth, skin injury)  present. Normal range of motion.       Left forearm: Normal.       Left wrist: Normal pulse.    Skin:      General: Skin is warm and dry.    Neurological:       General: No focal deficit present.       Mental Status: He is alert and oriented to person, place, and time.     Psychiatric:          Behavior: Behavior normal.           MDM   Number of Diagnoses or Management Options   Olecranon bursitis of left elbow   Diagnosis management comments: Patient is alert, afebrile, vital stable.  Presents ambulatory for several days of atraumatic left elbow swelling and pain after repetitive use of his arm.  Exam and xray consistent with olecranon bursitis.  No signs of  septic bursitis or septic joint. No indications at this time for emergent drainage. Place in ace wrap for compression, no neuro changes after application. Will recommend nsaids, stretching, and f/up with his orthopedist if symptoms persist. Return precautions  outlined, all  questions answered at this time.           Amount and/or Complexity of Data Reviewed   Discuss the patient with other providers:  yes (Dr. Marlene Bast)      4:26 PM   Pt has been reevaluated. There are no new complaints, changes, or physical findings at this time. All results have been reviewed with patient and/or family. Medications have been reviewed w/ pt and/or  family. Pt and/or family's questions have been answered. Pt and/or family expressed good understanding of the dx/tx/rx and is in agreement with plan of care. Pt instructed and agreed to f/u w/ orthopedics and to return to ED upon further deterioration.  Return precautions outlined. All questions answered at this time. Pt is stable and ready for discharge.         IMPRESSION:      1.  Olecranon bursitis of left elbow            PLAN:   1.      Current Discharge Medication List               2.      Follow-up Information                  Follow up With  Specialties  Details  Why  Contact Info              Trude Mcburney, MD  Orthopedic Surgery  Schedule an appointment as soon as possible for a visit     [address]   [address]   [place] [address]   [phone]                           Return to ED if worse              Procedures

## 2021-10-17 NOTE — Progress Notes (Signed)
I have reviewed discharge instructions with the patient.  The patient verbalized understanding.

## 2021-10-17 NOTE — Progress Notes (Signed)
Ace wrap applied to pt left elbow within position of comfort and PMS intact.

## 2021-10-17 NOTE — Progress Notes (Signed)
Ice applied to pt left elbow.

## 2021-10-17 NOTE — ED Notes (Signed)
Pt arrives with swollen red LEFT elbow.

## 2021-10-30 ENCOUNTER — Other Ambulatory Visit: Payer: Self-pay | Admitting: Cardiology

## 2021-10-30 NOTE — Telephone Encounter (Signed)
Prescription refill request for Eliquis received. Indication: afib  Last office visit: Vincenza Hews. 08/09/2021 Scr: 1.09, 09/02/2021 Age: 65 yo  Weight: 83.9 kg   Refill sent.

## 2021-11-05 ENCOUNTER — Inpatient Hospital Stay: Admit: 2021-11-05 | Payer: BLUE CROSS/BLUE SHIELD | Attending: Family Medicine | Primary: Family Medicine

## 2021-11-05 ENCOUNTER — Encounter

## 2021-11-05 DIAGNOSIS — R1030 Lower abdominal pain, unspecified: Secondary | ICD-10-CM

## 2021-11-05 MED ORDER — IOPAMIDOL 76 % IV SOLN
370 mg iodine /mL (76 %) | Freq: Once | INTRAVENOUS | Status: AC
Start: 2021-11-05 — End: 2021-11-05
  Administered 2021-11-05: 14:00:00 via INTRAVENOUS

## 2021-11-05 MED FILL — ISOVUE-370  76 % INTRAVENOUS SOLUTION: 370 mg iodine /mL (76 %) | INTRAVENOUS | Qty: 100

## 2021-11-05 NOTE — Progress Notes (Signed)
CT abdomen pelvis-please inform the patient that no acute abnormality was noted on his CT scan.  He does have other chronic nonemergent findings.  There is a fatty liver, and a small hiatal hernia, he has some diverticulosis of the colon, and enlarged prostate.  Please forward this report to his urologist.

## 2021-11-11 ENCOUNTER — Ambulatory Visit: Admit: 2021-11-11 | Payer: BLUE CROSS/BLUE SHIELD | Attending: Family Medicine | Primary: Family Medicine

## 2021-11-11 DIAGNOSIS — Z0000 Encounter for general adult medical examination without abnormal findings: Secondary | ICD-10-CM

## 2021-11-11 NOTE — Progress Notes (Signed)
SUBJECTIVE:   [name] is a 65 y.o. male who is here for follow up of routine medical issues.      Pt presents for an annual physical. He went to the ER on 10/17/2021 for elbow pain and was diagnosed with olecranon bursitis in elbow. He saw orthopedics and was prescribed methylprednisolone. He will finish the course before getting a flu shot.    He also reports following up with cardiology and had a chest CT for his history of aortic aneurysm which is being followed. The aneurysm has not changed in size and he gets a CT done every 6 months.    Pt had labs done on 09/19/2021, which indicated no abnormalities. He had a normal PSA. An abdominal and pelvic CT showed enlarged prostate.     At this time, he is otherwise doing well and has brought no other complaints to my attention today.  For a list of the medical issues addressed today, see the assessment and plan below.    PMH:   Past Medical History:   Diagnosis Date    CAD (coronary artery disease)     heart attack 2011    Chronic obstructive pulmonary disease (HCC)     Hypercholesterolemia     Hypertension      PSH:  has a past surgical history that includes pr cardiac surg procedure unlist and hx other surgical (02/05/2018).    All: has no allergies on file.   MEDS:   Current Outpatient Medications   Medication Sig    metFORMIN ER (GLUCOPHAGE XR) 500 mg tablet TAKE 1 TABLET BY MOUTH EVERY DAY    Farxiga 10 mg tab tablet     famotidine (PEPCID) 40 mg tablet     Anoro Ellipta 62.5-25 mcg/actuation inhaler     alfuzosin SR (UROXATRAL) 10 mg SR tablet Take 1 Tab by mouth daily. Indications: enlarged prostate with urination problem    glucose blood VI test strips (Accu-Chek SmartView Test Strip) strip Test blood sugar every morning fasting.    amLODIPine (NORVASC) 5 mg tablet     cyclobenzaprine (FLEXERIL) 10 mg tablet TAKE 1 TABLET BY MOUTH THREE TIMES A DAY AS NEEDED FOR MUSCLE SPASMS    clopidogrel (PLAVIX) 75 mg tab Take 75 mg by mouth daily.     rosuvastatin (CRESTOR) 20 mg tablet Take 1 Tab by mouth nightly.    lisinopril (PRINIVIL, ZESTRIL) 10 mg tablet Take 1 Tab by mouth daily.    glucose blood VI test strips strip by Does Not Apply route See Admin Instructions.    metoprolol succinate (TOPROL-XL) 50 mg XL tablet Take 50 mg by mouth daily.    omega-3 fatty acids-vitamin e 1,000 mg cap Take 1 Cap by mouth daily.    aspirin delayed-release 81 mg tablet Take 81 mg by mouth daily.    multivitamin (ONE A DAY) tablet Take 1 Tab by mouth daily.    glucosamine-chondroitin (ARTHX) 500-400 mg cap Take 1 Cap by mouth daily.     No current facility-administered medications for this visit.       FH: family history includes Diabetes in his brother; Heart Disease in his father; Hypertension in his mother.   SH:  reports that he quit smoking about 4 years ago. His smoking use included cigarettes. He has a 80.00 pack-year smoking history. He has never used smokeless tobacco. He reports that he does not drink alcohol and does not use drugs.     Review of Systems -  History obtained from the patient  General ROS: no fever, chills, fatigue, body aches  Psychological ROS: no change in anxiety, depression, SI/HI  Ophthalmic ROS: no blurred vision, myopia, double vision  ENT ROS: no dysphagia, otalgia, otorrhea, rhinorrhea, post nasal drip  Respiratory ROS: no cough, shortness of breath, or wheezing  Cardiovascular ROS: no chest pain or dyspnea on exertion  Gastrointestinal ROS: no abdominal pain, change in bowel habits, or black or bloody stools  Genito-Urinary ROS: no frequency, urgency, incontinence, dysuria, hematouria  Musculoskeletal ROS: no arthralagia, myalgia  Neurological ROS: no headaches, dizziness, lightheadedness, tremors, seizures  Dermatological ROS: no rash or lesions    OBJECTIVE:   Vitals: Visit Vitals  BP (!) 95/58   Pulse 68   Temp 98.4 ??F (36.9 ??C) (Temporal)   Resp 16   Ht 5\' 10"  (1.778 m)   Wt 226 lb (102.5 kg)   SpO2 94%   BMI 32.43 kg/m??      Gen:  Pleasant 65 y.o.  male in NAD.    HEENT: PERRLA. EOMI. OP moist and pink.    Neck: Supple.  No LAD.   HEART: RRR, No M/G/R.      LUNGS: CTAB No W/R.    ABDOMEN: S, NT, ND, BS+.     EXTREMITIES: Warm. No C/C/E.    MUSCULOSKELETAL: Normal ROM, muscle strength 5/5 all groups.    NEURO: Alert and oriented x 3.  Cranial nerves grossly intact.  No focal sensory or motor deficits noted.   SKIN: Warm. Dry. No rashes or other lesions noted.    ASSESSMENT/ PLAN: Diagnoses and all orders for this visit:    1. Annual physical exam    1. Annual physical exam  Pt presented for an annual physical, which I completed. I found no abnormalities on physical exam and the patient did not note any further acute issues. I reviewed his labs and imagining with him. I recommended he see urology for further evaluation of his prostate. He will continue treatment ( steroids) for bursitis before getting vaccinated.    ICD-10-CM ICD-9-CM    1. Annual physical exam  Z00.00 V70.0            Follow-up and Dispositions    Return in about 6 months (around 05/12/2022) for follow up.         I have reviewed the patient's medications and risks/side effects/benefits were discussed. Diagnosis(-es) explained to patient and questions answered. Literature provided where appropriate.     Written by 07/12/2022, Scribekick, as dictated by Rosina Lowenstein, MD.

## 2021-11-11 NOTE — Progress Notes (Signed)
1. "Have you been to the ER, urgent care clinic since your last visit?  Hospitalized since your last visit?"  Yes. Urgent care, for left arm    2. "Have you seen or consulted any other health care providers outside of the [facility] since your last visit?" No     3. For patients aged 63-75: Has the patient had a colonoscopy / FIT/ Cologuard? Yes - no Care Gap present

## 2021-11-14 ENCOUNTER — Other Ambulatory Visit: Payer: Self-pay

## 2021-11-14 ENCOUNTER — Ambulatory Visit (INDEPENDENT_AMBULATORY_CARE_PROVIDER_SITE_OTHER): Payer: Medicare Other

## 2021-11-14 DIAGNOSIS — I5032 Chronic diastolic (congestive) heart failure: Secondary | ICD-10-CM | POA: Diagnosis not present

## 2021-11-14 LAB — ECHOCARDIOGRAM COMPLETE
Area-P 1/2: 2.42 cm2
S' Lateral: 4.66 cm
Single Plane A4C EF: 39.1 %

## 2021-11-15 ENCOUNTER — Telehealth: Payer: Self-pay | Admitting: *Deleted

## 2021-11-15 NOTE — Telephone Encounter (Signed)
-----   Message from Jonelle Sidle, MD sent at 11/14/2021  5:20 PM EST ----- Patient of Dr. Wyline Mood.  Echocardiogram was ordered by Mr. Vincenza Hews NP and routed to me.  LVEF is 35 to 40% which represents an improvement compared to outside echocardiogram at [facility] earlier in the year per chart review.  Please make sure that follow-up is scheduled with Dr. Wyline Mood.

## 2021-11-15 NOTE — Telephone Encounter (Signed)
Patient's daughter returned call for echo results.

## 2021-11-18 NOTE — Telephone Encounter (Signed)
Patient notified and verbalized understanding.  Follow up already scheduled for this Wednesday with Dr. Wyline Mood.

## 2021-11-20 ENCOUNTER — Encounter: Payer: Self-pay | Admitting: Cardiology

## 2021-11-20 ENCOUNTER — Ambulatory Visit (INDEPENDENT_AMBULATORY_CARE_PROVIDER_SITE_OTHER): Payer: Medicare Other | Admitting: Cardiology

## 2021-11-20 VITALS — BP 100/64 | HR 62 | Ht 70.0 in | Wt 184.6 lb

## 2021-11-20 DIAGNOSIS — I4892 Unspecified atrial flutter: Secondary | ICD-10-CM

## 2021-11-20 DIAGNOSIS — J449 Chronic obstructive pulmonary disease, unspecified: Secondary | ICD-10-CM

## 2021-11-20 DIAGNOSIS — I2510 Atherosclerotic heart disease of native coronary artery without angina pectoris: Secondary | ICD-10-CM | POA: Diagnosis not present

## 2021-11-20 DIAGNOSIS — I5022 Chronic systolic (congestive) heart failure: Secondary | ICD-10-CM

## 2021-11-20 DIAGNOSIS — I6523 Occlusion and stenosis of bilateral carotid arteries: Secondary | ICD-10-CM | POA: Diagnosis not present

## 2021-11-20 MED ORDER — PREDNISONE 5 MG PO TABS: 5.0000 mg | ORAL_TABLET | Freq: Every day | ORAL | 0 refills | Status: AC

## 2021-11-20 NOTE — Progress Notes (Signed)
Clinical Summary Dave [name] is a 65 y.o.male seen today for follow up of the following medical problems.        1. CAD/Chronic systolic HF -  06/2009 CABG x 3 (LIMA-LAD, SVG-diag, SVG-PDA). He presented with an anterior MI.  - from prior clinic notes, LVEF 40% (do not see echo report) at that time.   -11/2016 echo that showed LVEF 15%, diffuse hypokinesis.  - Jan 2018 cath as reported below, received DES to distal SVG-rPDA.  RHC with CI 2, mean PA 39, PCWP 29, LVEDP 22 - 10/2017 echo LVEF 25% - he has turned down ICD - not interested in cardiac rehab.   -  intervention cards recs for indefinitie brillinta.     - borderline high K previously, have not started aldactone.   - no recent chest pain. No sob or doe, no edema  -compliant wuith meds   11/2021 echo: LVEF 35-40%, grade I dd, mild RV dysfunction,    - ongonig SOB/DOE, mild to moderate activities - no recent edema - chronic cough, wheezing. Compliant with inhalers. Can improve with inhalers - weight up 179 to 184 - takes lasix prn, takes about 1-2 times per month. - CXR clear, BNP 400.  - he reports home sats can get into 70s to 80s at home    2. SOB/COPD -follwoed by pcp - using home O2    3. PAD - followed by vascular   Jan 2021 ABI  Vasc US 50-74% bilateral SFA disease, ABIs R 0.88 L 0.95   4. Carotid stenosis -01/2018 carotid US: RICA 1-39%, LICA 60-79%.   - 01/2019 carotid US: RICA 0-10%, LIC 93-23% - 04/5731 carotid US: RICA 1-39%, LICA 60-79%   5. HTN  -compliant with meds  6. Aflutter - new diagnosis 05/2021 during admission to [facility] with COPD exacerbation - patient did not want to pursue DCCV - he is compliant with meds    Past Medical History:  Diagnosis Date   Anterior myocardial infarction [facility]) 06/2009   Dave [name] 12/16/2016   Anxiety    Anxiety disorder    Atrial fibrillation (HCC)    Chest pain    Chronic brain syndrome    Elevated blood pressure    Tobacco abuse       Allergies  Allergen Reactions   Chocolate Hazelnut Flavor      Current Outpatient Medications  Medication Sig Dispense Refill   aspirin EC 81 MG tablet Take 81 mg by mouth daily.     atorvastatin (LIPITOR) 80 MG tablet TAKE ONE TABLET BY MOUTH DAILY 90 tablet 2   digoxin (LANOXIN) 0.125 MG tablet Take 125 mcg by mouth every morning.     ELIQUIS 5 MG TABS tablet TAKE ONE TABLET BY MOUTH TWICE DAILY 60 tablet 5   ENTRESTO 97-103 MG TAKE ONE TABLET BY MOUTH TWICE DAILY. 180 tablet 1   furosemide (LASIX) 20 MG tablet TAKE ONE TABLET BY MOUTH ONCE DAILY AS NEEDED (FOR WEIGHT GAIN OF 3 POUNDS IN 24 HOURS OR 5 POUNDS IN ONE WEEK) 90 tablet 1   Ipratropium-Albuterol (COMBIVENT RESPIMAT) 20-100 MCG/ACT AERS respimat Inhale 1 puff into the lungs every 6 (six) hours.     metoprolol (TOPROL-XL) 200 MG 24 hr tablet TAKE ONE TABLET BY MOUTH DAILY 90 tablet 1   nitroGLYCERIN (NITROSTAT) 0.4 MG SL tablet DISSOLVE ONE TABLET UNDER TONGUE EVERY 5 MINUTES UP TO 3 DOSES AS NEEDED FOR CHEST PAIN 25 tablet 2   PROAIR HFA  108 (90 Base) MCG/ACT inhaler Inhale 2 puffs into the lungs 4 (four) times daily as needed for shortness of breath.      TRELEGY ELLIPTA 100-62.5-25 MCG/INH AEPB Inhale 1 puff into the lungs daily.     TUSSIN DM 100-10 MG/5ML liquid Take 5 mLs by mouth every 4 (four) hours as needed.     No current facility-administered medications for this visit.     Past Surgical History:  Procedure Laterality Date   CARDIAC CATHETERIZATION  06/2009   Dave [name] 12/16/2016   CARDIAC CATHETERIZATION N/A 12/18/2016   Procedure: Right/Left Heart Cath and Coronary/Graft Angiography;  Surgeon: Nelva Bush, MD;  Location: [place] [facility];  Service: Cardiovascular;  Laterality: N/A;   CARDIAC CATHETERIZATION N/A 12/18/2016   Procedure: Coronary Stent Intervention;  Surgeon: Nelva Bush, MD;  Location: [place] [facility];  Service: Cardiovascular;  Laterality: N/A;  SVG-PDA   CORONARY ANGIOPLASTY      CORONARY ARTERY BYPASS GRAFT  2010   X3   LAPAROSCOPIC CHOLECYSTECTOMY  2007     Allergies  Allergen Reactions   Chocolate Hazelnut Flavor       Family History  Problem Relation Age of Onset   Coronary artery disease Other      Social History Dave [name] reports that he has been smoking cigarettes. He started smoking about 50 years ago. He has a 44.00 pack-year smoking history. He has never used smokeless tobacco. Dave [name] reports no history of alcohol use.   Review of Systems CONSTITUTIONAL: No weight loss, fever, chills, weakness or fatigue.  HEENT: Eyes: No visual loss, blurred vision, double vision or yellow sclerae.No hearing loss, sneezing, congestion, runny nose or sore throat.  SKIN: No rash or itching.  CARDIOVASCULAR: per hpi RESPIRATORY: per hpi GASTROINTESTINAL: No anorexia, nausea, vomiting or diarrhea. No abdominal pain or blood.  GENITOURINARY: No burning on urination, no polyuria NEUROLOGICAL: No headache, dizziness, syncope, paralysis, ataxia, numbness or tingling in the extremities. No change in bowel or bladder control.  MUSCULOSKELETAL: No muscle, back pain, joint pain or stiffness.  LYMPHATICS: No enlarged nodes. No history of splenectomy.  PSYCHIATRIC: No history of depression or anxiety.  ENDOCRINOLOGIC: No reports of sweating, cold or heat intolerance. No polyuria or polydipsia.  Marland Kitchen   Physical Examination Today's Vitals   11/20/21 1538  BP: 100/64  Pulse: 62  SpO2: 96%  Weight: 184 lb 9.6 oz (83.7 kg)  Height: 5\' 10"  (1.778 m)   Body mass index is 26.49 kg/m.  Gen: resting comfortably, no acute distress HEENT: no scleral icterus, pupils equal round and reactive, no palptable cervical adenopathy,  CV: RRR, no mr/g no jvd Resp: Clear to auscultation bilaterally GI: abdomen is soft, non-tender, non-distended, normal bowel sounds, no hepatosplenomegaly MSK: extremities are warm, no edema.  Skin: warm, no rash Neuro:  no focal  deficits Psych: appropriate affect   Diagnostic Studies 11/2021 echo IMPRESSIONS     1. Left ventricular ejection fraction, by estimation, is 35 to 40%. The  left ventricle has moderately decreased function. The left ventricle  demonstrates regional wall motion abnormalities (see scoring  diagram/findings for description). The left  ventricular internal cavity size was mildly dilated. Left ventricular  diastolic parameters are consistent with Grade I diastolic dysfunction  (impaired relaxation).   2. Right ventricular systolic function is mildly reduced. The right  ventricular size is normal. There is normal pulmonary artery systolic  pressure. The estimated right ventricular systolic pressure is [id number] mmHg.   3. Left atrial size  was upper normal.   4. The mitral valve is grossly normal. Trivial mitral valve  regurgitation.   5. The aortic valve is tricuspid. Aortic valve regurgitation is not  visualized.   6. The inferior vena cava is normal in size with greater than 50%  respiratory variability, suggesting right atrial pressure of 3 mmHg.     Assessment and Plan  1. CAD/ICM/Chronic systolic HF - significant SOB and hypoxia at home, does not appear significantl volume overloaded today. I think issue may be more related to his COPD - will try taking his lasix 20mg  x 3 days then resume prn dosing. COntinue current CHF medications - soft bp's, would not titrate HF meds  2. COPD exacerbation - ongoing significant SOB/DOE, he reports home O2 can get into the 70s or 80s at times - some expiratory wheezing on exam. Will give 5 day course of prednisone 40mg  daily and refer to pulmonary.    3. Aflutter - paroxysmal, he is actually back in SR to day - continue toprol, digoxin, eliquils. Can stop ASA>       Arnoldo Lenis, M.D.

## 2021-11-20 NOTE — Patient Instructions (Addendum)
Medication Instructions:  Stop Aspirin.  Begin Prednisone 5mg  daily x 5 days only, then STOP Increase Lasix to 20mg  daily x 3 days, then back to as needed for swelling Continue all other medications.     Labwork: none  Testing/Procedures: none  Follow-Up: 6 weeks   Any Other Special Instructions Will Be Listed Below (If Applicable). You have been referred to:  Pulmonary  Please call the office early next week to give update on breathing.   If you need a refill on your cardiac medications before your next appointment, please call your pharmacy.

## 2021-12-02 ENCOUNTER — Other Ambulatory Visit: Payer: Self-pay | Admitting: Cardiology

## 2021-12-05 ENCOUNTER — Encounter: Payer: BLUE CROSS/BLUE SHIELD | Attending: Family Medicine | Primary: Family Medicine

## 2022-01-06 ENCOUNTER — Encounter: Payer: Self-pay | Admitting: Cardiology

## 2022-01-06 ENCOUNTER — Ambulatory Visit (INDEPENDENT_AMBULATORY_CARE_PROVIDER_SITE_OTHER): Payer: Medicare Other | Admitting: Cardiology

## 2022-01-06 VITALS — BP 108/60 | HR 82 | Ht 70.0 in | Wt 180.0 lb

## 2022-01-06 DIAGNOSIS — I2510 Atherosclerotic heart disease of native coronary artery without angina pectoris: Secondary | ICD-10-CM | POA: Diagnosis not present

## 2022-01-06 DIAGNOSIS — I5022 Chronic systolic (congestive) heart failure: Secondary | ICD-10-CM | POA: Diagnosis not present

## 2022-01-06 DIAGNOSIS — I4892 Unspecified atrial flutter: Secondary | ICD-10-CM | POA: Diagnosis not present

## 2022-01-06 MED ORDER — DAPAGLIFLOZIN PROPANEDIOL 10 MG PO TABS: 10.0000 mg | ORAL_TABLET | Freq: Every day | ORAL | 6 refills | Status: DC

## 2022-01-06 NOTE — Progress Notes (Signed)
Clinical Summary Dave [name] is a 66 y.o.male seen today for follow up of the following medical problems.        1. CAD/Chronic systolic HF -  06/2009 CABG x 3 (LIMA-LAD, SVG-diag, SVG-PDA). He presented with an anterior MI.  - from prior clinic notes, LVEF 40% (do not see echo report) at that time.   -11/2016 echo that showed LVEF 15%, diffuse hypokinesis.  - Jan 2018 cath as reported below, received DES to distal SVG-rPDA.  RHC with CI 2, mean PA 39, PCWP 29, LVEDP 22 - 10/2017 echo LVEF 25% - he has turned down ICD - not interested in cardiac rehab.   -  intervention cards recs for indefinitie brillinta.     - borderline high K previously, have not started aldactone.    11/2021 echo: LVEF 35-40%, grade I dd, mild RV dysfunction,     - no recent edema. No orthopnea - compliant with meds - weights 184 lbs -->180. Takes lasix prn, has not needed - no recent chest pains.     2. SOB/COPD -follwoed by pcp - using home O2   - gave course of prednisone and referred to pulmonary after last visit - had some improvement in symptoms - compliant with inhalers. Chronic wheezing. - some ongoing DOE.       3. PAD - followed by vascular   Jan 2021 ABI  Vasc US 50-74% bilateral SFA disease, ABIs R 0.88 L 0.95   4. Carotid stenosis -01/2018 carotid US: RICA 1-39%, LICA 60-79%.   - 01/2019 carotid US: RICA 0-88%, LIC 11-03% - 12/5943 carotid US: RICA 1-39%, LICA 60-79%   5. HTN  -compliant with meds   6. Aflutter,paroxysmal - new diagnosis 05/2021 during admission to [name] with COPD exacerbation   - no recent palpitations - no bleeding on eliquis.    Past Medical History:  Diagnosis Date   Anterior myocardial infarction [name]) 06/2009   Dave [name] 12/16/2016   Anxiety    Anxiety disorder    Atrial fibrillation (HCC)    Chest pain    Chronic brain syndrome    Elevated blood pressure    Tobacco abuse      Allergies  Allergen Reactions   Chocolate Hazelnut  Flavor      Current Outpatient Medications  Medication Sig Dispense Refill   atorvastatin (LIPITOR) 80 MG tablet TAKE ONE TABLET BY MOUTH DAILY 90 tablet 2   digoxin (LANOXIN) 0.125 MG tablet Take 125 mcg by mouth every morning.     ELIQUIS 5 MG TABS tablet TAKE ONE TABLET BY MOUTH TWICE DAILY 60 tablet 5   ENTRESTO 97-103 MG TAKE ONE TABLET BY MOUTH TWICE DAILY 180 tablet 1   furosemide (LASIX) 20 MG tablet TAKE ONE TABLET BY MOUTH ONCE DAILY AS NEEDED (FOR WEIGHT GAIN OF 3 POUNDS IN 24 HOURS OR 5 POUNDS IN ONE WEEK) 90 tablet 1   Ipratropium-Albuterol (COMBIVENT RESPIMAT) 20-100 MCG/ACT AERS respimat Inhale 1 puff into the lungs every 6 (six) hours.     metoprolol (TOPROL-XL) 200 MG 24 hr tablet TAKE ONE TABLET BY MOUTH DAILY 90 tablet 1   nitroGLYCERIN (NITROSTAT) 0.4 MG SL tablet DISSOLVE ONE TABLET UNDER TONGUE EVERY 5 MINUTES UP TO 3 DOSES AS NEEDED FOR CHEST PAIN 25 tablet 2   PROAIR HFA 108 (90 Base) MCG/ACT inhaler Inhale 2 puffs into the lungs 4 (four) times daily as needed for shortness of breath.      TRELEGY ELLIPTA 100-62.5-25  MCG/INH AEPB Inhale 1 puff into the lungs daily.     TUSSIN DM 100-10 MG/5ML liquid Take 5 mLs by mouth every 4 (four) hours as needed.     No current facility-administered medications for this visit.     Past Surgical History:  Procedure Laterality Date   CARDIAC CATHETERIZATION  06/2009   Dave [name] 12/16/2016   CARDIAC CATHETERIZATION N/A 12/18/2016   Procedure: Right/Left Heart Cath and Coronary/Graft Angiography;  Surgeon: Dave Kendall, MD;  Location: [facility] INVASIVE CV LAB;  Service: Cardiovascular;  Laterality: N/A;   CARDIAC CATHETERIZATION N/A 12/18/2016   Procedure: Coronary Stent Intervention;  Surgeon: Dave Kendall, MD;  Location: [facility] INVASIVE CV LAB;  Service: Cardiovascular;  Laterality: N/A;  SVG-PDA   CORONARY ANGIOPLASTY     CORONARY ARTERY BYPASS GRAFT  2010   X3   LAPAROSCOPIC CHOLECYSTECTOMY  2007     Allergies  Allergen  Reactions   Chocolate Hazelnut Flavor       Family History  Problem Relation Age of Onset   Coronary artery disease Other      Social History [name] reports that he has been smoking cigarettes. He started smoking about 50 years ago. He has a 44.00 pack-year smoking history. He has never used smokeless tobacco. [name] reports no history of alcohol use.   Review of Systems CONSTITUTIONAL: No weight loss, fever, chills, weakness or fatigue.  HEENT: Eyes: No visual loss, blurred vision, double vision or yellow sclerae.No hearing loss, sneezing, congestion, runny nose or sore throat.  SKIN: No rash or itching.  CARDIOVASCULAR: per hpi RESPIRATORY: No shortness of breath, cough or sputum.  GASTROINTESTINAL: No anorexia, nausea, vomiting or diarrhea. No abdominal pain or blood.  GENITOURINARY: No burning on urination, no polyuria NEUROLOGICAL: No headache, dizziness, syncope, paralysis, ataxia, numbness or tingling in the extremities. No change in bowel or bladder control.  MUSCULOSKELETAL: No muscle, back pain, joint pain or stiffness.  LYMPHATICS: No enlarged nodes. No history of splenectomy.  PSYCHIATRIC: No history of depression or anxiety.  ENDOCRINOLOGIC: No reports of sweating, cold or heat intolerance. No polyuria or polydipsia.  Marland Kitchen   Physical Examination Today's Vitals   01/06/22 1510  BP: 108/60  Pulse: 82  SpO2: 98%  Weight: 180 lb (81.6 kg)  Height: 5\' 10"  (1.778 m)   Body mass index is 25.83 kg/m.  Gen: resting comfortably, no acute distress HEENT: no scleral icterus, pupils equal round and reactive, no palptable cervical adenopathy,  CV: RRR, no m/r/g no jvd Resp: Clear to auscultation bilaterally GI: abdomen is soft, non-tender, non-distended, normal bowel sounds, no hepatosplenomegaly MSK: extremities are warm, no edema.  Skin: warm, no rash Neuro:  no focal deficits Psych: appropriate affect   Diagnostic Studies  11/2021 echo IMPRESSIONS      1. Left ventricular ejection fraction, by estimation, is 35 to 40%. The  left ventricle has moderately decreased function. The left ventricle  demonstrates regional wall motion abnormalities (see scoring  diagram/findings for description). The left  ventricular internal cavity size was mildly dilated. Left ventricular  diastolic parameters are consistent with Grade I diastolic dysfunction  (impaired relaxation).   2. Right ventricular systolic function is mildly reduced. The right  ventricular size is normal. There is normal pulmonary artery systolic  pressure. The estimated right ventricular systolic pressure is 27.2 mmHg.   3. Left atrial size was upper normal.   4. The mitral valve is grossly normal. Trivial mitral valve  regurgitation.   5. The aortic valve is  tricuspid. Aortic valve regurgitation is not  visualized.   6. The inferior vena cava is normal in size with greater than 50%  respiratory variability, suggesting right atrial pressure of 3 mmHg.      Assessment and Plan  1. CAD/ICM/Chronic systolic HF - significant SOB and hypoxia at home, does not appear significantl volume overloaded today. I think issue may be more related to his COPD - continue current cardiac meds, start farxiga 10mg  daily   2. COPD  - ongoing significant SOB/DOE - has pulm appt later this week   3. Aflutter - paroxysmal, no recent symptoms - continue current meds      Dave [name], M.D.

## 2022-01-06 NOTE — Patient Instructions (Addendum)
Medication Instructions:  Your physician has recommended you make the following change in your medication:  Start farxiga 10 mg daily-take 30 day free voucher to your pharmacy Continue other medications the same  Labwork: none  Testing/Procedures: none  Follow-Up: Your physician recommends that you schedule a follow-up appointment in: 3 months  Any Other Special Instructions Will Be Listed Below (If Applicable).  If you need a refill on your cardiac medications before your next appointment, please call your pharmacy.

## 2022-01-09 ENCOUNTER — Institutional Professional Consult (permissible substitution): Payer: Medicare Other | Admitting: Internal Medicine

## 2022-01-09 NOTE — Progress Notes (Incomplete)
° °  Dave [name], male    DOB: 03-Mar-1956,    MRN: [id number]   Brief patient profile:  ***  yo*** *** referred to pulmonary clinic in [place]  01/09/2022 by *** for ***      History of Present Illness  01/09/2022  Pulmonary/ 1st office eval/ Sherene Sires / [facility]  No chief complaint on file.    Dyspnea:  *** Cough: *** Sleep: *** SABA use:   Past Medical History:  Diagnosis Date   Anterior myocardial infarction (HCC) 06/2009   [name] 12/16/2016   Anxiety    Anxiety disorder    Atrial fibrillation (HCC)    Chest pain    Chronic brain syndrome    Elevated blood pressure    Tobacco abuse     Outpatient Medications Prior to Visit  Medication Sig Dispense Refill   amoxicillin (AMOXIL) 500 MG capsule Take 500 mg by mouth every 8 (eight) hours.     atorvastatin (LIPITOR) 80 MG tablet TAKE ONE TABLET BY MOUTH DAILY 90 tablet 2   dapagliflozin propanediol (FARXIGA) 10 MG TABS tablet Take 1 tablet (10 mg total) by mouth daily before breakfast. 30 tablet 6   digoxin (LANOXIN) 0.125 MG tablet Take 125 mcg by mouth every morning.     ELIQUIS 5 MG TABS tablet TAKE ONE TABLET BY MOUTH TWICE DAILY 60 tablet 5   ENTRESTO 97-103 MG TAKE ONE TABLET BY MOUTH TWICE DAILY 180 tablet 1   furosemide (LASIX) 20 MG tablet TAKE ONE TABLET BY MOUTH ONCE DAILY AS NEEDED (FOR WEIGHT GAIN OF 3 POUNDS IN 24 HOURS OR 5 POUNDS IN ONE WEEK) 90 tablet 1   Ipratropium-Albuterol (COMBIVENT RESPIMAT) 20-100 MCG/ACT AERS respimat Inhale 1 puff into the lungs every 6 (six) hours.     metoprolol (TOPROL-XL) 200 MG 24 hr tablet TAKE ONE TABLET BY MOUTH DAILY 90 tablet 1   nitroGLYCERIN (NITROSTAT) 0.4 MG SL tablet DISSOLVE ONE TABLET UNDER TONGUE EVERY 5 MINUTES UP TO 3 DOSES AS NEEDED FOR CHEST PAIN 25 tablet 2   nystatin (MYCOSTATIN) 100000 UNIT/ML suspension Take 5 mLs by mouth 4 (four) times daily.     PROAIR HFA 108 (90 Base) MCG/ACT inhaler Inhale 2 puffs into the lungs 4 (four) times daily as needed for  shortness of breath.      TRELEGY ELLIPTA 100-62.5-25 MCG/INH AEPB Inhale 1 puff into the lungs daily.     TUSSIN DM 100-10 MG/5ML liquid Take 5 mLs by mouth every 4 (four) hours as needed.     No facility-administered medications prior to visit.     Objective:     There were no vitals taken for this visit.         Assessment   No problem-specific Assessment & Plan notes found for this encounter.     Sandrea Hughs, MD 01/09/2022

## 2022-01-13 NOTE — Telephone Encounter (Signed)
States pt passed away on 2/2    States needs dr to sign death certificate   States assigned to provider in EDR system  States can this be done

## 2022-01-20 DIAGNOSIS — I509 Heart failure, unspecified: Secondary | ICD-10-CM | POA: Diagnosis not present

## 2022-01-20 DIAGNOSIS — R0781 Pleurodynia: Secondary | ICD-10-CM | POA: Diagnosis not present

## 2022-01-20 DIAGNOSIS — F17210 Nicotine dependence, cigarettes, uncomplicated: Secondary | ICD-10-CM | POA: Diagnosis not present

## 2022-01-20 DIAGNOSIS — J449 Chronic obstructive pulmonary disease, unspecified: Secondary | ICD-10-CM | POA: Diagnosis not present

## 2022-01-20 DIAGNOSIS — I252 Old myocardial infarction: Secondary | ICD-10-CM | POA: Diagnosis not present

## 2022-01-20 DIAGNOSIS — I110 Hypertensive heart disease with heart failure: Secondary | ICD-10-CM | POA: Diagnosis not present

## 2022-01-20 DIAGNOSIS — Z951 Presence of aortocoronary bypass graft: Secondary | ICD-10-CM | POA: Diagnosis not present

## 2022-01-20 DIAGNOSIS — E785 Hyperlipidemia, unspecified: Secondary | ICD-10-CM | POA: Diagnosis not present

## 2022-01-31 ENCOUNTER — Other Ambulatory Visit: Payer: Self-pay | Admitting: Cardiology

## 2022-02-05 DEATH — deceased

## 2022-03-26 ENCOUNTER — Institutional Professional Consult (permissible substitution): Payer: Medicare Other | Admitting: Internal Medicine

## 2022-03-26 NOTE — Progress Notes (Incomplete)
? ?  Dave [name], male    DOB: March 04, 1956,    MRN: [id number] ? ? ?Brief patient profile:  ?***  yo*** *** referred to pulmonary clinic in [place]  03/26/2022 by *** for ***  ? ? ? ? ?History of Present Illness  ?03/26/2022  Pulmonary/ 1st office eval/ Dave [name] / [name]  ?No chief complaint on file. ?  ? ?Dyspnea:  *** ?Cough: *** ?Sleep: *** ?SABA use:  ? ?Past Medical History:  ?Diagnosis Date  ? Anterior myocardial infarction [facility]) 06/2009  ? Dave [name] 12/16/2016  ? Anxiety   ? Anxiety disorder   ? Atrial fibrillation (HCC)   ? Chest pain   ? Chronic brain syndrome   ? Elevated blood pressure   ? Tobacco abuse   ? ? ?Outpatient Medications Prior to Visit  ?Medication Sig Dispense Refill  ? amoxicillin (AMOXIL) 500 MG capsule Take 500 mg by mouth every 8 (eight) hours.    ? atorvastatin (LIPITOR) 80 MG tablet TAKE ONE TABLET BY MOUTH DAILY 90 tablet 2  ? dapagliflozin propanediol (FARXIGA) 10 MG TABS tablet Take 1 tablet (10 mg total) by mouth daily before breakfast. 30 tablet 6  ? digoxin (LANOXIN) 0.125 MG tablet Take 125 mcg by mouth every morning.    ? ELIQUIS 5 MG TABS tablet TAKE ONE TABLET BY MOUTH TWICE DAILY 60 tablet 5  ? ENTRESTO 97-103 MG TAKE ONE TABLET BY MOUTH TWICE DAILY 180 tablet 1  ? furosemide (LASIX) 20 MG tablet TAKE ONE TABLET BY MOUTH ONCE DAILY AS NEEDED (FOR WEIGHT GAIN OF 3 POUNDS IN 24 HOURS OR 5 POUNDS IN ONE WEEK) 90 tablet 1  ? Ipratropium-Albuterol (COMBIVENT RESPIMAT) 20-100 MCG/ACT AERS respimat Inhale 1 puff into the lungs every 6 (six) hours.    ? metoprolol (TOPROL-XL) 200 MG 24 hr tablet TAKE ONE TABLET BY MOUTH DAILY 90 tablet 1  ? nitroGLYCERIN (NITROSTAT) 0.4 MG SL tablet DISSOLVE ONE TABLET UNDER TONGUE EVERY 5 MINUTES UP TO 3 DOSES AS NEEDED FOR CHEST PAIN 25 tablet 2  ? nystatin (MYCOSTATIN) 100000 UNIT/ML suspension Take 5 mLs by mouth 4 (four) times daily.    ? PROAIR HFA 108 (90 Base) MCG/ACT inhaler Inhale 2 puffs into the lungs 4 (four) times daily as needed for  shortness of breath.     ? TRELEGY ELLIPTA 100-62.5-25 MCG/INH AEPB Inhale 1 puff into the lungs daily.    ? TUSSIN DM 100-10 MG/5ML liquid Take 5 mLs by mouth every 4 (four) hours as needed.    ? ?No facility-administered medications prior to visit.  ? ? ? ?Objective:  ?  ? ?There were no vitals taken for this visit. ? ?  ? ? ?   ?Assessment  ? ?No problem-specific Assessment & Plan notes found for this encounter. ? ? ? ? ?Dave Hughs, MD ?03/26/2022 ?   ?

## 2022-04-09 DIAGNOSIS — I429 Cardiomyopathy, unspecified: Secondary | ICD-10-CM | POA: Diagnosis not present

## 2022-04-09 DIAGNOSIS — Z0000 Encounter for general adult medical examination without abnormal findings: Secondary | ICD-10-CM | POA: Diagnosis not present

## 2022-04-09 DIAGNOSIS — F17210 Nicotine dependence, cigarettes, uncomplicated: Secondary | ICD-10-CM | POA: Diagnosis not present

## 2022-04-09 DIAGNOSIS — J9611 Chronic respiratory failure with hypoxia: Secondary | ICD-10-CM | POA: Diagnosis not present

## 2022-04-09 DIAGNOSIS — Z299 Encounter for prophylactic measures, unspecified: Secondary | ICD-10-CM | POA: Diagnosis not present

## 2022-04-09 DIAGNOSIS — Z1331 Encounter for screening for depression: Secondary | ICD-10-CM | POA: Diagnosis not present

## 2022-04-09 DIAGNOSIS — D6869 Other thrombophilia: Secondary | ICD-10-CM | POA: Diagnosis not present

## 2022-04-09 DIAGNOSIS — Z6826 Body mass index (BMI) 26.0-26.9, adult: Secondary | ICD-10-CM | POA: Diagnosis not present

## 2022-04-09 DIAGNOSIS — Z1339 Encounter for screening examination for other mental health and behavioral disorders: Secondary | ICD-10-CM | POA: Diagnosis not present

## 2022-04-09 DIAGNOSIS — Z7189 Other specified counseling: Secondary | ICD-10-CM | POA: Diagnosis not present

## 2022-04-10 DIAGNOSIS — Z125 Encounter for screening for malignant neoplasm of prostate: Secondary | ICD-10-CM | POA: Diagnosis not present

## 2022-04-10 DIAGNOSIS — R5383 Other fatigue: Secondary | ICD-10-CM | POA: Diagnosis not present

## 2022-04-10 DIAGNOSIS — E7800 Pure hypercholesterolemia, unspecified: Secondary | ICD-10-CM | POA: Diagnosis not present

## 2022-04-10 DIAGNOSIS — Z79899 Other long term (current) drug therapy: Secondary | ICD-10-CM | POA: Diagnosis not present

## 2022-04-11 ENCOUNTER — Ambulatory Visit: Payer: Medicare Other | Admitting: Cardiology

## 2022-04-17 DIAGNOSIS — Z1212 Encounter for screening for malignant neoplasm of rectum: Secondary | ICD-10-CM | POA: Diagnosis not present

## 2022-04-17 DIAGNOSIS — Z1211 Encounter for screening for malignant neoplasm of colon: Secondary | ICD-10-CM | POA: Diagnosis not present

## 2022-05-01 ENCOUNTER — Encounter: Payer: Self-pay | Admitting: *Deleted

## 2022-05-01 ENCOUNTER — Other Ambulatory Visit: Payer: Self-pay | Admitting: Cardiology

## 2022-05-01 MED ORDER — ATORVASTATIN CALCIUM 80 MG PO TABS: 80.0000 mg | ORAL_TABLET | Freq: Every day | ORAL | 2 refills | Status: DC

## 2022-05-01 NOTE — Telephone Encounter (Signed)
Prescription refill request for Eliquis received.  Indication: afib  Last office visit: [place], 01/06/2022 Scr: 1.14, 01/20/2022 Age: 66 yo  Weight: 81.6 kg   Pt is on appropriate dose. Refill sent.

## 2022-05-21 IMAGING — DX DG CHEST 1V PORT
1 series · 2 of 2 positions shown · non-contrast
Comparison: Chest x-ray 05/30/2021, CT chest 04/05/2021

CLINICAL DATA: Shortness of breath.

EXAM:
PORTABLE CHEST 1 VIEW

[Series 1: chest ap · 0.14mm/px · 2 of 2 slices shown]
[im 1/2]
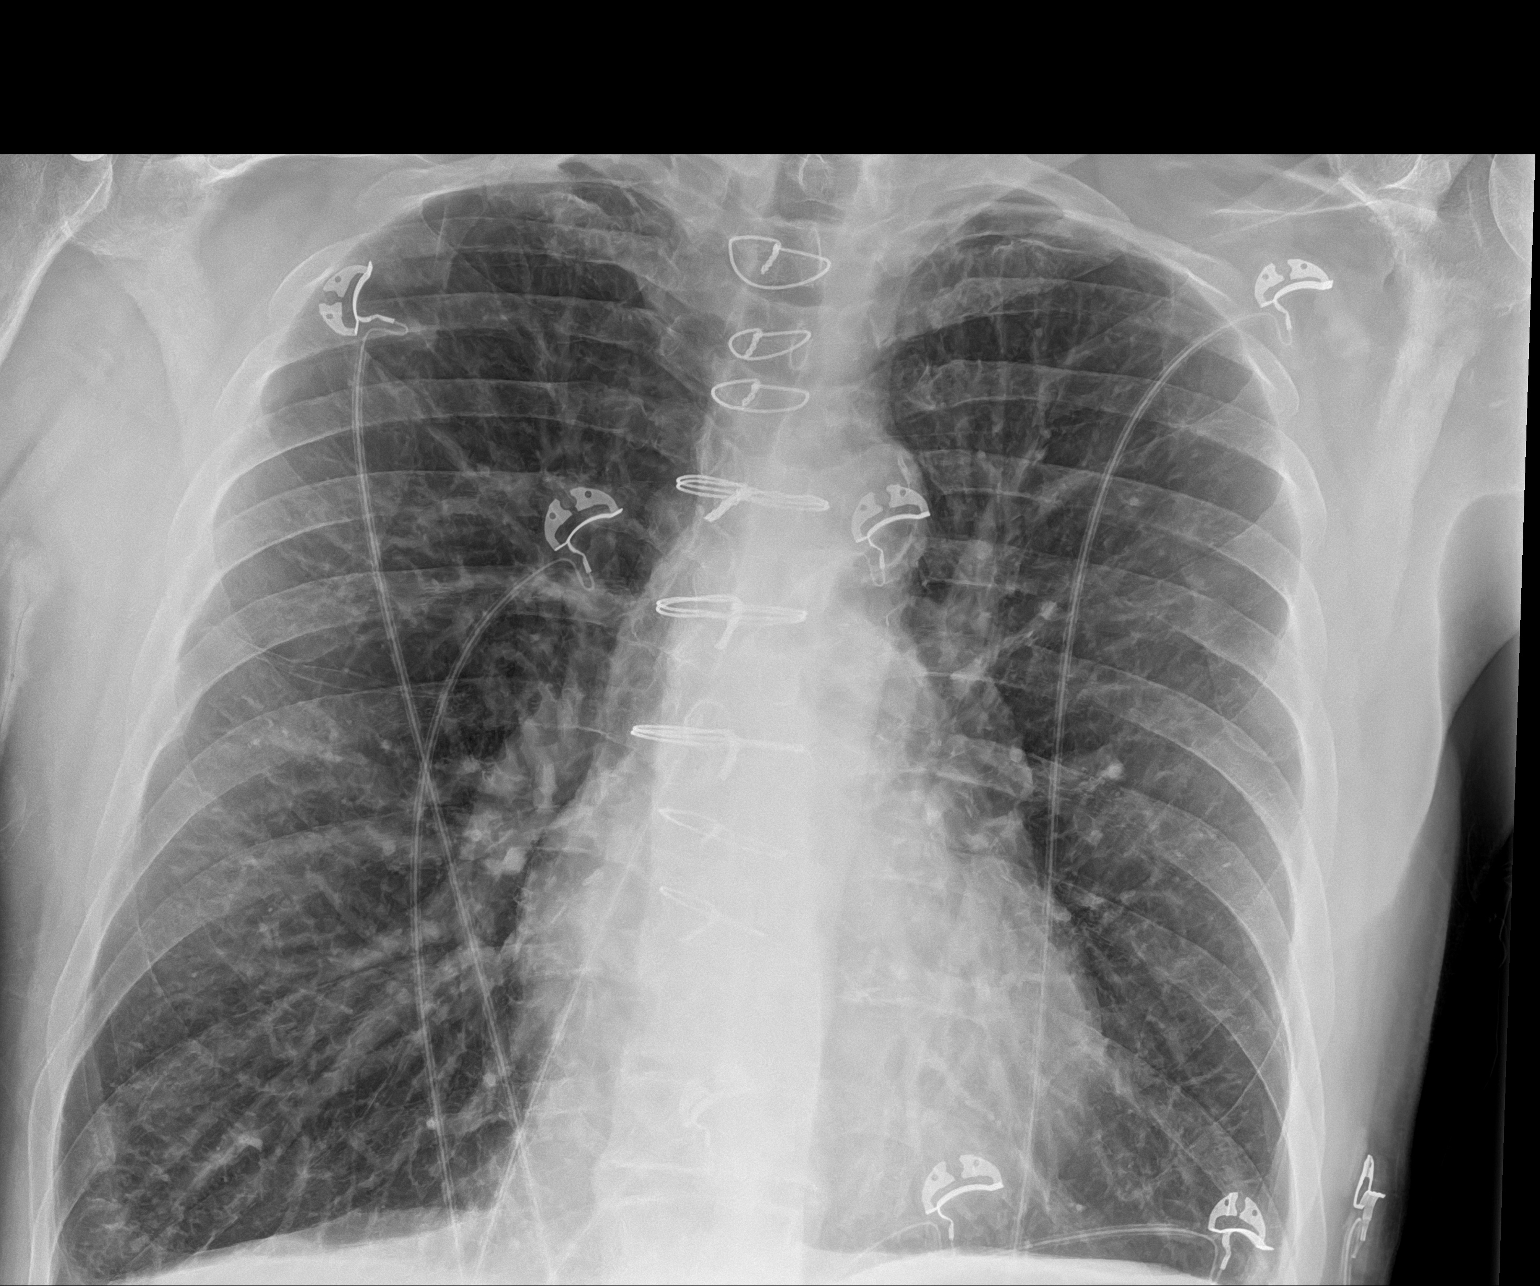
[im 2/2]
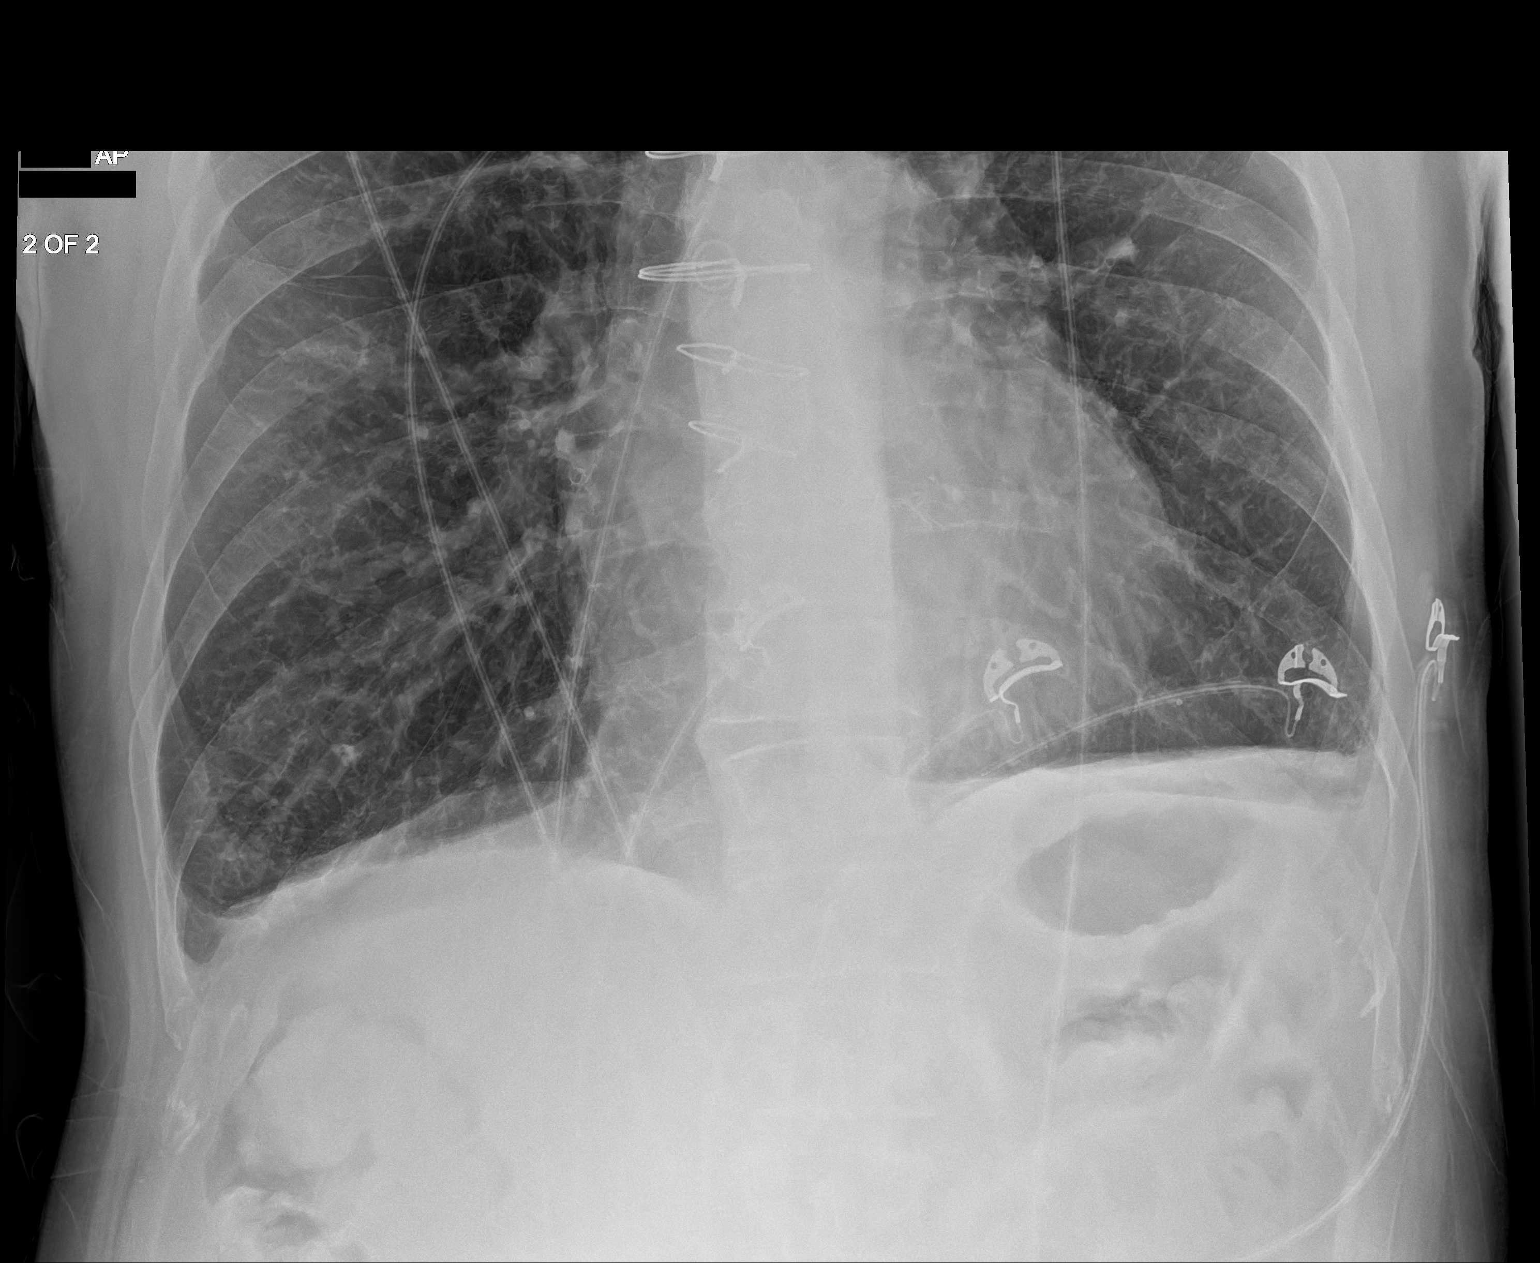

[2 of 2 positions shown; findings below may reference images not displayed]

FINDINGS: The heart and mediastinal contours are unchanged. Aortic
calcification.

Hyperinflation of the lungs. No focal consolidation. Coarsened
interstitial markings with no overt pulmonary edema. No pleural
effusion. No pneumothorax.

No acute osseous abnormality.
IMPRESSION: 1. No active disease.
2. Aortic Atherosclerosis (4ZKVJ-UJV.V) and Emphysema (4ZKVJ-S6Q.7).

## 2022-06-04 ENCOUNTER — Other Ambulatory Visit: Payer: Self-pay | Admitting: Cardiology

## 2022-06-18 ENCOUNTER — Encounter: Payer: Self-pay | Admitting: Cardiology

## 2022-06-18 ENCOUNTER — Ambulatory Visit (INDEPENDENT_AMBULATORY_CARE_PROVIDER_SITE_OTHER): Payer: Medicare Other | Admitting: Cardiology

## 2022-06-18 VITALS — BP 100/55 | HR 76 | Ht 70.0 in | Wt 177.8 lb

## 2022-06-18 DIAGNOSIS — I6523 Occlusion and stenosis of bilateral carotid arteries: Secondary | ICD-10-CM

## 2022-06-18 DIAGNOSIS — I4892 Unspecified atrial flutter: Secondary | ICD-10-CM

## 2022-06-18 DIAGNOSIS — J449 Chronic obstructive pulmonary disease, unspecified: Secondary | ICD-10-CM | POA: Diagnosis not present

## 2022-06-18 DIAGNOSIS — I5022 Chronic systolic (congestive) heart failure: Secondary | ICD-10-CM

## 2022-06-18 DIAGNOSIS — I2510 Atherosclerotic heart disease of native coronary artery without angina pectoris: Secondary | ICD-10-CM | POA: Diagnosis not present

## 2022-06-18 MED ORDER — ENTRESTO 49-51 MG PO TABS: 1.0000 | ORAL_TABLET | Freq: Two times a day (BID) | ORAL | 6 refills | Status: DC

## 2022-06-18 NOTE — Patient Instructions (Signed)
Medication Instructions:  Your physician has recommended you make the following change in your medication:  Decrease to entresto 49/51 mg twice a day Continue all other medications as directed  Labwork: none  Testing/Procedures: Your physician has requested that you have a carotid duplex. This test is an ultrasound of the carotid arteries in your neck. It looks at blood flow through these arteries that supply the brain with blood. Allow one hour for this exam. There are no restrictions or special instructions.   Follow-Up:  Your physician recommends that you schedule a follow-up appointment in: 6 months  Any Other Special Instructions Will Be Listed Below (If Applicable).   If you need a refill on your cardiac medications before your next appointment, please call your pharmacy.

## 2022-06-18 NOTE — Progress Notes (Signed)
Clinical Summary Mr. Moffatt is a 66 y.o.male seen today for follow up of the following medical problems.        1. CAD/Chronic systolic HF -  06/2009 CABG x 3 (LIMA-LAD, SVG-diag, SVG-PDA). He presented with an anterior MI.  - from prior clinic notes, LVEF 40% (do not see echo report) at that time.   -11/2016 echo that showed LVEF 15%, diffuse hypokinesis.  - Jan 2018 cath as reported below, received DES to distal SVG-rPDA.  RHC with CI 2, mean PA 39, PCWP 29, LVEDP 22 - 10/2017 echo LVEF 25% - he has turned down ICD - not interested in cardiac rehab.   -  intervention cards recs for indefinitie brillinta.     - borderline high K previously, have not started aldactone.     11/2021 echo: LVEF 35-40%, grade I dd, mild RV dysfunction,      No chest pains. Chronic SOB/DOE unchanged, using home O2 at home - compliant with meds. No LE edema. Has not needed prn lasix.        2. SOB/COPD -follwoed by pcp - using home O2   s. Chronic wheezing. - some ongoing DOE.    No recent cough, no wheezing, Compliant with inhalers - still smoking. Missed his pulmonary appt       3. PAD - followed by vascular   Jan 2021 ABI  Vasc US 50-74% bilateral SFA disease, ABIs R 0.88 L 0.95   4. Carotid stenosis -01/2018 carotid US: RICA 1-39%, LICA 60-79%.   - 01/2019 carotid US: RICA 1-61%, LIC 09-60% - 03/5408 carotid US: RICA 1-39%, LICA 60-79% - 08/2021 RICA 1-39%, LICA 60-79%   5. HTN  -compliant with meds   6. Aflutter,paroxysmal - new diagnosis 05/2021 during admission to [facility] with COPD exacerbation     - no palpitations - no bleeding on eliquis.  Past Medical History:  Diagnosis Date   Anterior myocardial infarction [facility]) 06/2009   [name] 12/16/2016   Anxiety    Anxiety disorder    Atrial fibrillation (HCC)    Chest pain    Chronic brain syndrome    Elevated blood pressure    Tobacco abuse      Allergies  Allergen Reactions   Chocolate Hazelnut Flavor       Current Outpatient Medications  Medication Sig Dispense Refill   ELIQUIS 5 MG TABS tablet TAKE ONE TABLET BY MOUTH TWICE DAILY 60 tablet 5   amoxicillin (AMOXIL) 500 MG capsule Take 500 mg by mouth every 8 (eight) hours.     atorvastatin (LIPITOR) 80 MG tablet Take 1 tablet (80 mg total) by mouth daily. 90 tablet 2   dapagliflozin propanediol (FARXIGA) 10 MG TABS tablet Take 1 tablet (10 mg total) by mouth daily before breakfast. 30 tablet 6   digoxin (LANOXIN) 0.125 MG tablet Take 125 mcg by mouth every morning.     ENTRESTO 97-103 MG TAKE ONE TABLET BY MOUTH TWICE DAILY 180 tablet 1   furosemide (LASIX) 20 MG tablet TAKE ONE TABLET BY MOUTH ONCE DAILY AS NEEDED (FOR WEIGHT GAIN OF 3 POUNDS IN 24 HOURS OR 5 POUNDS IN ONE WEEK) 90 tablet 1   Ipratropium-Albuterol (COMBIVENT RESPIMAT) 20-100 MCG/ACT AERS respimat Inhale 1 puff into the lungs every 6 (six) hours.     metoprolol (TOPROL-XL) 200 MG 24 hr tablet TAKE ONE TABLET BY MOUTH DAILY 90 tablet 1   nitroGLYCERIN (NITROSTAT) 0.4 MG SL tablet DISSOLVE ONE TABLET UNDER TONGUE  EVERY 5 MINUTES UP TO 3 DOSES AS NEEDED FOR CHEST PAIN 25 tablet 2   nystatin (MYCOSTATIN) 100000 UNIT/ML suspension Take 5 mLs by mouth 4 (four) times daily.     PROAIR HFA 108 (90 Base) MCG/ACT inhaler Inhale 2 puffs into the lungs 4 (four) times daily as needed for shortness of breath.      TRELEGY ELLIPTA 100-62.5-25 MCG/INH AEPB Inhale 1 puff into the lungs daily.     TUSSIN DM 100-10 MG/5ML liquid Take 5 mLs by mouth every 4 (four) hours as needed.     No current facility-administered medications for this visit.     Past Surgical History:  Procedure Laterality Date   CARDIAC CATHETERIZATION  06/2009   [name] 12/16/2016   CARDIAC CATHETERIZATION N/A 12/18/2016   Procedure: Right/Left Heart Cath and Coronary/Graft Angiography;  Surgeon: Yvonne Kendall, MD;  Location: [facility] INVASIVE CV LAB;  Service: Cardiovascular;  Laterality: N/A;   CARDIAC CATHETERIZATION  N/A 12/18/2016   Procedure: Coronary Stent Intervention;  Surgeon: Yvonne Kendall, MD;  Location: [facility] INVASIVE CV LAB;  Service: Cardiovascular;  Laterality: N/A;  SVG-PDA   CORONARY ANGIOPLASTY     CORONARY ARTERY BYPASS GRAFT  2010   X3   LAPAROSCOPIC CHOLECYSTECTOMY  2007     Allergies  Allergen Reactions   Chocolate Hazelnut Flavor       Family History  Problem Relation Age of Onset   Coronary artery disease Other      Social History Mr. Assefa reports that he has been smoking cigarettes. He started smoking about 51 years ago. He has a 44.00 pack-year smoking history. He has never used smokeless tobacco. Mr. Harpster reports no history of alcohol use.   Review of Systems CONSTITUTIONAL: No weight loss, fever, chills, weakness or fatigue.  HEENT: Eyes: No visual loss, blurred vision, double vision or yellow sclerae.No hearing loss, sneezing, congestion, runny nose or sore throat.  SKIN: No rash or itching.  CARDIOVASCULAR: per hpi RESPIRATORY: per hpi GASTROINTESTINAL: No anorexia, nausea, vomiting or diarrhea. No abdominal pain or blood.  GENITOURINARY: No burning on urination, no polyuria NEUROLOGICAL: No headache, dizziness, syncope, paralysis, ataxia, numbness or tingling in the extremities. No change in bowel or bladder control.  MUSCULOSKELETAL: No muscle, back pain, joint pain or stiffness.  LYMPHATICS: No enlarged nodes. No history of splenectomy.  PSYCHIATRIC: No history of depression or anxiety.  ENDOCRINOLOGIC: No reports of sweating, cold or heat intolerance. No polyuria or polydipsia.  Marland Kitchen   Physical Examination Today's Vitals   06/18/22 1455  BP: (!) 86/58  Pulse: 76  SpO2: 97%  Weight: 177 lb 12.8 oz (80.6 kg)  Height: 5\' 10"  (1.778 m)   Body mass index is 25.51 kg/m.  Gen: resting comfortably, no acute distress HEENT: no scleral icterus, pupils equal round and reactive, no palptable cervical adenopathy,  CV: RRR, no mrg, no jvd Resp: Clear to  auscultation bilaterally GI: abdomen is soft, non-tender, non-distended, normal bowel sounds, no hepatosplenomegaly MSK: extremities are warm, no edema.  Skin: warm, no rash Neuro:  no focal deficits Psych: appropriate affect   Diagnostic Studies  11/2021 echo IMPRESSIONS     1. Left ventricular ejection fraction, by estimation, is 35 to 40%. The  left ventricle has moderately decreased function. The left ventricle  demonstrates regional wall motion abnormalities (see scoring  diagram/findings for description). The left  ventricular internal cavity size was mildly dilated. Left ventricular  diastolic parameters are consistent with Grade I diastolic dysfunction  (impaired relaxation).  2. Right ventricular systolic function is mildly reduced. The right  ventricular size is normal. There is normal pulmonary artery systolic  pressure. The estimated right ventricular systolic pressure is 27.2 mmHg.   3. Left atrial size was upper normal.   4. The mitral valve is grossly normal. Trivial mitral valve  regurgitation.   5. The aortic valve is tricuspid. Aortic valve regurgitation is not  visualized.   6. The inferior vena cava is normal in size with greater than 50%  respiratory variability, suggesting right atrial pressure of 3 mmHg.      Assessment and Plan   1. CAD/ICM/Chronic systolic HF - euvolemic. Soft bps with some dizziness at times, lower entresto to 49/51mg  bid. With soft bp's and prior borderline high K have not started aldactone.    2. COPD  - ongoing significant SOB/DOE - refer to pulm, missed his last appt   3. Aflutter, acquired thrombophilia - paroxysmal,no symptoms - continue current meds including eliquis for stroke prevention   F/u 6 months  Antoine Poche, M.D.

## 2022-07-29 ENCOUNTER — Other Ambulatory Visit: Payer: Self-pay | Admitting: *Deleted

## 2022-07-29 MED ORDER — DAPAGLIFLOZIN PROPANEDIOL 10 MG PO TABS: 10.0000 mg | ORAL_TABLET | Freq: Every day | ORAL | 6 refills | Status: DC

## 2022-08-04 ENCOUNTER — Other Ambulatory Visit: Payer: Self-pay | Admitting: Cardiology

## 2022-08-20 ENCOUNTER — Ambulatory Visit: Payer: Medicare Other | Attending: Cardiology

## 2022-08-20 DIAGNOSIS — I6523 Occlusion and stenosis of bilateral carotid arteries: Secondary | ICD-10-CM | POA: Insufficient documentation

## 2022-09-05 ENCOUNTER — Telehealth: Payer: Self-pay | Admitting: *Deleted

## 2022-09-05 NOTE — Telephone Encounter (Signed)
Laurine Blazer, LPN  0/53/9767  3:41 PM EDT Back to Top    Notified daughter Korbin Mapps), copy to pcp.    Laurine Blazer, LPN  9/37/9024  0:97 PM EDT     Left message to return call.   Arnoldo Lenis, MD  08/25/2022  3:22 PM EDT     Mild to moderate blockages on carotid US, just something to monitor at this time     Zandra Abts MD

## 2022-09-09 ENCOUNTER — Other Ambulatory Visit: Payer: Self-pay | Admitting: *Deleted

## 2022-09-09 MED ORDER — ENTRESTO 49-51 MG PO TABS: 1.0000 | ORAL_TABLET | Freq: Two times a day (BID) | ORAL | 1 refills | Status: DC

## 2022-09-22 ENCOUNTER — Other Ambulatory Visit: Payer: Self-pay | Admitting: Cardiology

## 2022-09-22 DIAGNOSIS — I6523 Occlusion and stenosis of bilateral carotid arteries: Secondary | ICD-10-CM

## 2022-10-15 ENCOUNTER — Telehealth: Payer: Self-pay

## 2022-10-15 DIAGNOSIS — Z299 Encounter for prophylactic measures, unspecified: Secondary | ICD-10-CM | POA: Diagnosis not present

## 2022-10-15 DIAGNOSIS — I700 Atherosclerosis of aorta: Secondary | ICD-10-CM | POA: Diagnosis not present

## 2022-10-15 DIAGNOSIS — J449 Chronic obstructive pulmonary disease, unspecified: Secondary | ICD-10-CM | POA: Diagnosis not present

## 2022-10-15 DIAGNOSIS — J9611 Chronic respiratory failure with hypoxia: Secondary | ICD-10-CM | POA: Diagnosis not present

## 2022-10-15 NOTE — Telephone Encounter (Signed)
..     Pre-operative Risk Assessment    Patient Name: Dave [name]  DOB: 1956/07/18 MRN: [id number]     Request for Surgical Clearance    Procedure:  Dental Extraction - Amount of Teeth to be Pulled:  3  Date of Surgery:  Clearance TBD                                 Surgeon:  Dr May DDS Surgeon's Group or Practice Name:  [facility] Phone number:  [phone] Fax number:  [phone]   Type of Clearance Requested:   - Medical  - Pharmacy:  Hold Apixaban (Eliquis) PLEASE ADVISE HOW MANY DAYS TO HOLD   Type of Anesthesia:  Local    Additional requests/questions:    Jola Babinski   10/15/2022, 12:34 PM

## 2022-10-17 ENCOUNTER — Telehealth: Payer: Self-pay | Admitting: *Deleted

## 2022-10-17 NOTE — Telephone Encounter (Signed)
S/w the pt's daughter Gladys Damme [facility]) who scheduled tele pre op appt 10/23/22 @ 2 pm. Med rec and consent are done.     Patient Consent for Virtual Visit        NICANOR MENDOLIA has provided verbal consent on 10/17/2022 for a virtual visit (video or telephone).   CONSENT FOR VIRTUAL VISIT FOR:  [name]  By participating in this virtual visit I agree to the following:  I hereby voluntarily request, consent and authorize [facility] and its employed or contracted physicians, physician assistants, nurse practitioners or other licensed health care professionals (the Practitioner), to provide me with telemedicine health care services (the "Services") as deemed necessary by the treating Practitioner. I acknowledge and consent to receive the Services by the Practitioner via telemedicine. I understand that the telemedicine visit will involve communicating with the Practitioner through live audiovisual communication technology and the disclosure of certain medical information by electronic transmission. I acknowledge that I have been given the opportunity to request an in-person assessment or other available alternative prior to the telemedicine visit and am voluntarily participating in the telemedicine visit.  I understand that I have the right to withhold or withdraw my consent to the use of telemedicine in the course of my care at any time, without affecting my right to future care or treatment, and that the Practitioner or I may terminate the telemedicine visit at any time. I understand that I have the right to inspect all information obtained and/or recorded in the course of the telemedicine visit and may receive copies of available information for a reasonable fee.  I understand that some of the potential risks of receiving the Services via telemedicine include:  Delay or interruption in medical evaluation due to technological equipment failure or disruption; Information transmitted may  not be sufficient (e.g. poor resolution of images) to allow for appropriate medical decision making by the Practitioner; and/or  In rare instances, security protocols could fail, causing a breach of personal health information.  Furthermore, I acknowledge that it is my responsibility to provide information about my medical history, conditions and care that is complete and accurate to the best of my ability. I acknowledge that Practitioner's advice, recommendations, and/or decision may be based on factors not within their control, such as incomplete or inaccurate data provided by me or distortions of diagnostic images or specimens that may result from electronic transmissions. I understand that the practice of medicine is not an exact science and that Practitioner makes no warranties or guarantees regarding treatment outcomes. I acknowledge that a copy of this consent can be made available to me via my patient portal [facility] MyChart), or I can request a printed copy by calling the office of [facility].    I understand that my insurance will be billed for this visit.   I have read or had this consent read to me. I understand the contents of this consent, which adequately explains the benefits and risks of the Services being provided via telemedicine.  I have been provided ample opportunity to ask questions regarding this consent and the Services and have had my questions answered to my satisfaction. I give my informed consent for the services to be provided through the use of telemedicine in my medical care

## 2022-10-17 NOTE — Telephone Encounter (Signed)
Patient with diagnosis of aflutter on Eliquis for anticoagulation.    Procedure: Dental Extraction - Amount of Teeth to be Pulled:  3  Date of procedure: TBD   CHA2DS2-VASc Score = 3   This indicates a 3.2% annual risk of stroke. The patient's score is based upon: CHF History: 1 HTN History: 1 Diabetes History: 0 Stroke History: 0 Vascular Disease History: 1 Age Score: 0 Gender Score: 0      CrCl 67 ml/min  Patient does NOT require pre-op antibiotics for dental procedure.  Per office protocol, patient can hold Eliquis for 1 day prior to procedure.    **This guidance is not considered finalized until pre-operative APP has relayed final recommendations.**

## 2022-10-17 NOTE — Telephone Encounter (Signed)
Primary Cardiologist:Branch, Christiane Ha, MD   Preoperative team, please contact this patient and set up a phone call appointment for further preoperative risk assessment. Please obtain consent and complete medication review. Thank you for your help.   I confirm that guidance regarding antiplatelet and oral anticoagulation therapy has been completed and, if necessary, noted below.   Levi Aland, NP-C  10/17/2022, 9:30 AM [address] Office [phone] Fax [phone]

## 2022-10-17 NOTE — Telephone Encounter (Signed)
S/w the pt's daughter Gladys Damme [facility]) who scheduled tele pre op appt 10/23/22 @ 2 pm. Med rec and consent are done.

## 2022-10-23 ENCOUNTER — Ambulatory Visit: Payer: Medicare Other | Attending: Cardiology | Admitting: Nurse Practitioner

## 2022-10-23 DIAGNOSIS — Z01810 Encounter for preprocedural cardiovascular examination: Secondary | ICD-10-CM

## 2022-10-23 NOTE — Progress Notes (Signed)
Virtual Visit via Telephone Note   Because of Dave [name]'s co-morbid illnesses, he is at least at moderate risk for complications without adequate follow up.  This format is felt to be most appropriate for this patient at this time.  The patient did not have access to video technology/had technical difficulties with video requiring transitioning to audio format only (telephone).  All issues noted in this document were discussed and addressed.  No physical exam could be performed with this format.  Please refer to the patient's chart for his consent to telehealth for [facility].  Evaluation Performed:  Preoperative cardiovascular risk assessment _____________   Date:  10/23/2022   Patient ID:  Dave [name], DOB March 25, 1956, MRN [id number] Patient Location:  Home Provider location:   Office  Primary Care Provider:  Glenda Chroman, MD Primary Cardiologist:  Carlyle Dolly, MD  Chief Complaint / Patient Profile   66 y.o. y/o male with a h/o CAD s/p CABG x3 in 2010, DES-SVG-RPDA in [id number], chronic systolic heart failure, paroxysmal atrial fibrillation/flutter, carotid artery stenosis, PAD, and hypertesnion who is pending dental extraction (3 teeth) With Dr. Lu Duffel, DDS of [facility] and presents today for telephonic preoperative cardiovascular risk assessment.  Past Medical History    Past Medical History:  Diagnosis Date   Anterior myocardial infarction ([place]) 06/2009   Dave [name] 12/16/2016   Anxiety    Anxiety disorder    Atrial fibrillation ([place])    Chest pain    Chronic brain syndrome    Elevated blood pressure    Tobacco abuse    Past Surgical History:  Procedure Laterality Date   CARDIAC CATHETERIZATION  06/2009   Dave [name] 12/16/2016   CARDIAC CATHETERIZATION N/A 12/18/2016   Procedure: Right/Left Heart Cath and Coronary/Graft Angiography;  Surgeon: Nelva Bush, MD;  Location: [place] [facility];  Service: Cardiovascular;   Laterality: N/A;   CARDIAC CATHETERIZATION N/A 12/18/2016   Procedure: Coronary Stent Intervention;  Surgeon: Nelva Bush, MD;  Location: [place] [facility];  Service: Cardiovascular;  Laterality: N/A;  SVG-PDA   CORONARY ANGIOPLASTY     CORONARY ARTERY BYPASS GRAFT  2010   X3   LAPAROSCOPIC CHOLECYSTECTOMY  2007    Allergies  Allergies  Allergen Reactions   Chocolate Hazelnut Flavor     History of Present Illness    Dave [name] is a 66 y.o. male who presents via audio/video conferencing for a telehealth visit today.  Pt was last seen in cardiology clinic on 06/18/2022 by Dr. Harl Bowie.  At that time Dave [name] was doing well. The patient is now pending procedure as outlined above. Since his last visit, he has been stable from a cardiac standpoint.   He denies chest pain, palpitations, dyspnea, pnd, orthopnea, n, v, dizziness, syncope, edema, weight gain, or early satiety. All other systems reviewed and are otherwise negative except as noted above.   Home Medications    Prior to Admission medications   Medication Sig Start Date End Date Taking? Authorizing Provider  amoxicillin (AMOXIL) 500 MG capsule Take 500 mg by mouth every 8 (eight) hours. 09/24/21   [provider]  atorvastatin (LIPITOR) 80 MG tablet Take 1 tablet (80 mg total) by mouth daily. 05/01/22   Arnoldo Lenis, MD  dapagliflozin propanediol (FARXIGA) 10 MG TABS tablet Take 1 tablet (10 mg total) by mouth daily before breakfast. 07/29/22   Arnoldo Lenis, MD  digoxin (LANOXIN) 0.125 MG tablet  Take 125 mcg by mouth every morning. 07/02/21   [provider]  ELIQUIS 5 MG TABS tablet TAKE ONE TABLET BY MOUTH TWICE DAILY 05/01/22   Antoine Poche, MD  ENTRESTO 97-103 MG TAKE ONE TABLET BY MOUTH TWICE DAILY 06/04/22   Antoine Poche, MD  furosemide (LASIX) 20 MG tablet TAKE ONE TABLET BY MOUTH ONCE DAILY AS NEEDED (FOR WEIGHT GAIN OF 3 POUNDS IN 24 HOURS OR 5 POUNDS IN ONE WEEK) 08/21/21    Antoine Poche, MD  Ipratropium-Albuterol (COMBIVENT RESPIMAT) 20-100 MCG/ACT AERS respimat Inhale 1 puff into the lungs every 6 (six) hours.    [provider]  metoprolol (TOPROL-XL) 200 MG 24 hr tablet TAKE ONE TABLET BY MOUTH DAILY 08/04/22   Antoine Poche, MD  nitroGLYCERIN (NITROSTAT) 0.4 MG SL tablet DISSOLVE ONE TABLET UNDER TONGUE EVERY 5 MINUTES UP TO 3 DOSES AS NEEDED FOR CHEST PAIN 09/02/21   Antoine Poche, MD  nystatin (MYCOSTATIN) 100000 UNIT/ML suspension Take 5 mLs by mouth 4 (four) times daily. 08/29/21   [provider]  PROAIR HFA 108 (90 Base) MCG/ACT inhaler Inhale 2 puffs into the lungs 4 (four) times daily as needed for shortness of breath.  11/08/16   [provider]  sacubitril-valsartan (ENTRESTO) 49-51 MG Take 1 tablet by mouth 2 (two) times daily. 09/09/22   Antoine Poche, MD  TRELEGY ELLIPTA 100-62.5-25 MCG/INH AEPB Inhale 1 puff into the lungs daily. 07/11/21   [provider]  TUSSIN DM 100-10 MG/5ML liquid Take 5 mLs by mouth every 4 (four) hours as needed. 06/08/21   [provider]    Physical Exam    Vital Signs:  Dave [name] does not have vital signs available for review today.  Given telephonic nature of communication, physical exam is limited. AAOx3. NAD. Normal affect.  Speech and respirations are unlabored.  Accessory Clinical Findings    None  Assessment & Plan    1.  Preoperative Cardiovascular Risk Assessment:  According to the Revised Cardiac Risk Index (RCRI), his Perioperative Risk of Major Cardiac Event is (%): 6.6. His Functional Capacity in METs is: 4.64 according to the Duke Activity Status Index (DASI).Therefore, based on ACC/AHA guidelines, patient would be at acceptable risk for the planned procedure without further cardiovascular testing.   The patient was advised that if he develops new symptoms prior to surgery to contact our office to arrange for a follow-up visit, and he  verbalized understanding.  Patient does NOT require pre-op antibiotics for dental procedure.   Per office protocol, patient can hold Eliquis for 1 day prior to procedure.  Please resume Eliquis as soon as possible postprocedure, at the discretion of the surgeon.   A copy of this note will be routed to requesting surgeon.  Time:   Today, I have spent 4 minutes with the patient with telehealth technology discussing medical history, symptoms, and management plan.     Joylene Grapes, NP  10/23/2022, 2:12 PM

## 2022-11-04 ENCOUNTER — Other Ambulatory Visit: Payer: Self-pay | Admitting: Cardiology

## 2022-11-05 NOTE — Telephone Encounter (Signed)
Prescription refill request for Eliquis received. Indication: A Flutter Last office visit: 10/23/22  E Monge NP Scr: 1.13 on 04/10/22 Age: 67 Weight: 80.6kg  Based on above findings Eliquis 5mg  twice daily is the appropriate dose.  Refill approved.

## 2022-12-17 ENCOUNTER — Telehealth: Payer: Self-pay | Admitting: Cardiology

## 2022-12-17 NOTE — Telephone Encounter (Signed)
Faxed ov note with surgical clearance from Spero Geralds, NP in Nov via [place].

## 2022-12-17 NOTE — Telephone Encounter (Signed)
[facility] calling because the need the clearance faxed over to their office phone note on 10/15/22. She stated that the pt is there now and has been holding eliquis for 2 days.   Fax number:  [phone]

## 2022-12-30 ENCOUNTER — Telehealth: Payer: Self-pay | Admitting: Cardiology

## 2022-12-30 ENCOUNTER — Other Ambulatory Visit: Payer: Self-pay | Admitting: *Deleted

## 2022-12-30 MED ORDER — ENTRESTO 49-51 MG PO TABS: 1.0000 | ORAL_TABLET | Freq: Two times a day (BID) | ORAL | 1 refills | Status: DC

## 2022-12-30 MED ORDER — DAPAGLIFLOZIN PROPANEDIOL 10 MG PO TABS: 10.0000 mg | ORAL_TABLET | Freq: Every day | ORAL | 1 refills | Status: DC

## 2022-12-30 NOTE — Telephone Encounter (Signed)
Daughter says she needs the Entresto that 49/51mg  to be called into the pharmacy. Please advise

## 2022-12-30 NOTE — Telephone Encounter (Signed)
Patient's daughter returning call. 

## 2022-12-30 NOTE — Telephone Encounter (Signed)
Left message to return call 

## 2022-12-30 NOTE — Telephone Encounter (Signed)
*  STAT* If patient is at the pharmacy, call can be transferred to refill team.   1. Which medications need to be refilled? (please list name of each medication and dose if known) ENTRESTO 97-103 MG   2. Which pharmacy/location (including street and city if local pharmacy) is medication to be sent to? [facility] [place] [address]   3. Do they need a 30 day or 90 day supply? 30  Pt has been without this med for a week.

## 2022-12-30 NOTE — Telephone Encounter (Signed)
Daughter Dung Salinger informed that refill for entresto 49/51 mg sent to [facility].

## 2022-12-30 NOTE — Telephone Encounter (Signed)
Mychart message sent informing that entresto refill sent to [facility].

## 2022-12-30 NOTE — Telephone Encounter (Signed)
Per 06/18/2022 visit with Dr. Kendal Hymen -   Assessment and Plan    1. CAD/ICM/Chronic systolic HF - euvolemic. Soft bps with some dizziness at times, lower entresto to 49/51mg  bid. With soft bp's and prior borderline high K have not started aldactone.

## 2022-12-30 NOTE — Addendum Note (Signed)
Addended by: Merlene Laughter on: 12/30/2022 01:08 PM   Modules accepted: Orders

## 2022-12-30 NOTE — Telephone Encounter (Signed)
Pt's daughter returning nurses call. Please advise 

## 2023-01-02 ENCOUNTER — Encounter: Payer: Self-pay | Admitting: *Deleted

## 2023-01-02 DIAGNOSIS — J9611 Chronic respiratory failure with hypoxia: Secondary | ICD-10-CM | POA: Diagnosis not present

## 2023-01-02 DIAGNOSIS — Z299 Encounter for prophylactic measures, unspecified: Secondary | ICD-10-CM | POA: Diagnosis not present

## 2023-01-02 DIAGNOSIS — F17210 Nicotine dependence, cigarettes, uncomplicated: Secondary | ICD-10-CM | POA: Diagnosis not present

## 2023-01-02 DIAGNOSIS — I700 Atherosclerosis of aorta: Secondary | ICD-10-CM | POA: Diagnosis not present

## 2023-01-02 DIAGNOSIS — J449 Chronic obstructive pulmonary disease, unspecified: Secondary | ICD-10-CM | POA: Diagnosis not present

## 2023-01-05 ENCOUNTER — Ambulatory Visit: Payer: Medicare Other | Attending: Cardiology | Admitting: Cardiology

## 2023-01-05 NOTE — Progress Notes (Deleted)
Clinical Summary Mr. Patrice is a 67 y.o.male  seen today for follow up of the following medical problems.        1. CAD/Chronic systolic HF -  99991111 CABG x 3 (LIMA-LAD, SVG-diag, SVG-PDA). He presented with an anterior MI.  - from prior clinic notes, LVEF 40% (do not see echo report) at that time.   -11/2016 echo that showed LVEF 15%, diffuse hypokinesis.  - Jan 2018 cath as reported below, received DES to distal SVG-rPDA.  RHC with CI 2, mean PA 39, PCWP 29, LVEDP 22 - 10/2017 echo LVEF 25% - he has turned down ICD - not interested in cardiac rehab.   -  intervention cards recs for indefinitie brillinta.     - borderline high K previously, have not started aldactone.     11/2021 echo: LVEF 35-40%, grade I dd, mild RV dysfunction,        No chest pains. Chronic SOB/DOE unchanged, using home O2 at home - compliant with meds. No LE edema. Has not needed prn lasix.        2. SOB/COPD -follwoed by pcp - using home O2   s. Chronic wheezing. - some ongoing DOE.    No recent cough, no wheezing, Compliant with inhalers - still smoking. Missed his pulmonary appt       3. PAD - followed by vascular   Jan 2021 ABI  Vasc US 50-74% bilateral SFA disease, ABIs R 0.88 L 0.95   4. Carotid stenosis -01/2018 carotid US: RICA 123456, LICA [id number].   - 01/2019 carotid US: RICA 123456, LIC [id number] - 123456 carotid US: RICA 123456, LICA [id number] - 123456 RICA 123456, LICA [id number]   5. HTN  -compliant with meds   6. Aflutter,paroxysmal - new diagnosis 05/2021 during admission to [facility] with COPD exacerbation     - no palpitations - no bleeding on eliquis.  Past Medical History:  Diagnosis Date   Anterior myocardial infarction [facility]) 06/2009   Archie Endo 12/16/2016   Anxiety    Anxiety disorder    Atrial fibrillation ([facility])    Chest pain    Chronic brain syndrome    Elevated blood pressure    Tobacco abuse      Allergies  Allergen Reactions   Chocolate Hazelnut Flavor       Current Outpatient Medications  Medication Sig Dispense Refill   amoxicillin (AMOXIL) 500 MG capsule Take 500 mg by mouth every 8 (eight) hours.     atorvastatin (LIPITOR) 80 MG tablet Take 1 tablet (80 mg total) by mouth daily. 90 tablet 2   dapagliflozin propanediol (FARXIGA) 10 MG TABS tablet Take 1 tablet (10 mg total) by mouth daily before breakfast. 90 tablet 1   digoxin (LANOXIN) 0.125 MG tablet Take 125 mcg by mouth every morning.     ELIQUIS 5 MG TABS tablet TAKE ONE TABLET BY MOUTH TWICE DAILY 60 tablet 5   furosemide (LASIX) 20 MG tablet TAKE ONE TABLET BY MOUTH ONCE DAILY AS NEEDED (FOR WEIGHT GAIN OF 3 POUNDS IN 24 HOURS OR 5 POUNDS IN ONE WEEK) 90 tablet 1   Ipratropium-Albuterol (COMBIVENT RESPIMAT) 20-100 MCG/ACT AERS respimat Inhale 1 puff into the lungs every 6 (six) hours.     metoprolol (TOPROL-XL) 200 MG 24 hr tablet TAKE ONE TABLET BY MOUTH DAILY 90 tablet 2   nitroGLYCERIN (NITROSTAT) 0.4 MG SL tablet DISSOLVE ONE TABLET UNDER TONGUE EVERY 5 MINUTES UP TO 3 DOSES AS NEEDED FOR CHEST  PAIN 25 tablet 2   nystatin (MYCOSTATIN) 100000 UNIT/ML suspension Take 5 mLs by mouth 4 (four) times daily.     PROAIR HFA 108 (90 Base) MCG/ACT inhaler Inhale 2 puffs into the lungs 4 (four) times daily as needed for shortness of breath.      sacubitril-valsartan (ENTRESTO) 49-51 MG Take 1 tablet by mouth 2 (two) times daily. 180 tablet 1   TRELEGY ELLIPTA 100-62.5-25 MCG/INH AEPB Inhale 1 puff into the lungs daily.     TUSSIN DM 100-10 MG/5ML liquid Take 5 mLs by mouth every 4 (four) hours as needed.     No current facility-administered medications for this visit.     Past Surgical History:  Procedure Laterality Date   CARDIAC CATHETERIZATION  06/2009   Archie Endo 12/16/2016   CARDIAC CATHETERIZATION N/A 12/18/2016   Procedure: Right/Left Heart Cath and Coronary/Graft Angiography;  Surgeon: Nelva Bush, MD;  Location: [place] [facility];  Service: Cardiovascular;  Laterality: N/A;    CARDIAC CATHETERIZATION N/A 12/18/2016   Procedure: Coronary Stent Intervention;  Surgeon: Nelva Bush, MD;  Location: [place] [facility];  Service: Cardiovascular;  Laterality: N/A;  SVG-PDA   CORONARY ANGIOPLASTY     CORONARY ARTERY BYPASS GRAFT  2010   X3   LAPAROSCOPIC CHOLECYSTECTOMY  2007     Allergies  Allergen Reactions   Chocolate Hazelnut Flavor       Family History  Problem Relation Age of Onset   Coronary artery disease Other      Social History Mr. Dorsey reports that he has been smoking cigarettes. He started smoking about 51 years ago. He has a 44.00 pack-year smoking history. He has never used smokeless tobacco. Mr. Cowher reports no history of alcohol use.   Review of Systems CONSTITUTIONAL: No weight loss, fever, chills, weakness or fatigue.  HEENT: Eyes: No visual loss, blurred vision, double vision or yellow sclerae.No hearing loss, sneezing, congestion, runny nose or sore throat.  SKIN: No rash or itching.  CARDIOVASCULAR:  RESPIRATORY: No shortness of breath, cough or sputum.  GASTROINTESTINAL: No anorexia, nausea, vomiting or diarrhea. No abdominal pain or blood.  GENITOURINARY: No burning on urination, no polyuria NEUROLOGICAL: No headache, dizziness, syncope, paralysis, ataxia, numbness or tingling in the extremities. No change in bowel or bladder control.  MUSCULOSKELETAL: No muscle, back pain, joint pain or stiffness.  LYMPHATICS: No enlarged nodes. No history of splenectomy.  PSYCHIATRIC: No history of depression or anxiety.  ENDOCRINOLOGIC: No reports of sweating, cold or heat intolerance. No polyuria or polydipsia.  Marland Kitchen   Physical Examination There were no vitals filed for this visit. There were no vitals filed for this visit.  Gen: resting comfortably, no acute distress HEENT: no scleral icterus, pupils equal round and reactive, no palptable cervical adenopathy,  CV Resp: Clear to auscultation bilaterally GI: abdomen is soft,  non-tender, non-distended, normal bowel sounds, no hepatosplenomegaly MSK: extremities are warm, no edema.  Skin: warm, no rash Neuro:  no focal deficits Psych: appropriate affect   Diagnostic Studies 11/2021 echo IMPRESSIONS     1. Left ventricular ejection fraction, by estimation, is 35 to 40%. The  left ventricle has moderately decreased function. The left ventricle  demonstrates regional wall motion abnormalities (see scoring  diagram/findings for description). The left  ventricular internal cavity size was mildly dilated. Left ventricular  diastolic parameters are consistent with Grade I diastolic dysfunction  (impaired relaxation).   2. Right ventricular systolic function is mildly reduced. The right  ventricular size is normal.  There is normal pulmonary artery systolic  pressure. The estimated right ventricular systolic pressure is [id number] mmHg.   3. Left atrial size was upper normal.   4. The mitral valve is grossly normal. Trivial mitral valve  regurgitation.   5. The aortic valve is tricuspid. Aortic valve regurgitation is not  visualized.   6. The inferior vena cava is normal in size with greater than 50%  respiratory variability, suggesting right atrial pressure of 3 mmHg.       Assessment and Plan   1. CAD/ICM/Chronic systolic HF - euvolemic. Soft bps with some dizziness at times, lower entresto to 49/'51mg'$  bid. With soft bp's and prior borderline high K have not started aldactone.    2. COPD  - ongoing significant SOB/DOE - refer to pulm, missed his last appt   3. Aflutter, acquired thrombophilia - paroxysmal,no symptoms - continue current meds including eliquis for stroke prevention     F/u 6 months     Arnoldo Lenis, M.D., F.A.C.C.

## 2023-01-06 ENCOUNTER — Encounter: Payer: Self-pay | Admitting: Cardiology

## 2023-02-07 DIAGNOSIS — K029 Dental caries, unspecified: Secondary | ICD-10-CM | POA: Diagnosis not present

## 2023-02-07 DIAGNOSIS — F17200 Nicotine dependence, unspecified, uncomplicated: Secondary | ICD-10-CM | POA: Diagnosis not present

## 2023-02-07 DIAGNOSIS — K047 Periapical abscess without sinus: Secondary | ICD-10-CM | POA: Diagnosis not present

## 2023-02-07 DIAGNOSIS — R0902 Hypoxemia: Secondary | ICD-10-CM | POA: Diagnosis not present

## 2023-02-17 ENCOUNTER — Telehealth: Payer: Self-pay | Admitting: Cardiology

## 2023-02-17 MED ORDER — DAPAGLIFLOZIN PROPANEDIOL 10 MG PO TABS: 10.0000 mg | ORAL_TABLET | Freq: Every day | ORAL | 1 refills | Status: DC

## 2023-02-17 MED ORDER — APIXABAN 5 MG PO TABS: 5.0000 mg | ORAL_TABLET | Freq: Two times a day (BID) | ORAL | 5 refills | Status: DC

## 2023-02-17 NOTE — Telephone Encounter (Signed)
*  STAT* If patient is at the pharmacy, call can be transferred to refill team.   1. Which medications need to be refilled? (please list name of each medication and dose if known)  dapagliflozin propanediol (FARXIGA) 10 MG TABS tablet ELIQUIS 5 MG TABS tablet  2. Which pharmacy/location (including street and city if local pharmacy) is medication to be sent to? [place], [place]  3. Do they need a 30 day or 90 day supply?   90 day supply

## 2023-02-17 NOTE — Telephone Encounter (Signed)
Prescription refill request for Eliquis received. Indication: AF Last office visit: 10/23/22  E Monge NP Scr: 1.13 on 04/09/22 Age:  67 Weight: 80.6kg  Based on above findings Eliquis '5mg'$  twice daily is the appropriate dose.  Refill approved.

## 2023-02-17 NOTE — Telephone Encounter (Signed)
Farxiga filled as requested.

## 2023-04-08 DIAGNOSIS — F17210 Nicotine dependence, cigarettes, uncomplicated: Secondary | ICD-10-CM | POA: Diagnosis not present

## 2023-04-08 DIAGNOSIS — Z7189 Other specified counseling: Secondary | ICD-10-CM | POA: Diagnosis not present

## 2023-04-08 DIAGNOSIS — Z1331 Encounter for screening for depression: Secondary | ICD-10-CM | POA: Diagnosis not present

## 2023-04-08 DIAGNOSIS — R5383 Other fatigue: Secondary | ICD-10-CM | POA: Diagnosis not present

## 2023-04-08 DIAGNOSIS — Z125 Encounter for screening for malignant neoplasm of prostate: Secondary | ICD-10-CM | POA: Diagnosis not present

## 2023-04-08 DIAGNOSIS — Z1339 Encounter for screening examination for other mental health and behavioral disorders: Secondary | ICD-10-CM | POA: Diagnosis not present

## 2023-04-08 DIAGNOSIS — Z0000 Encounter for general adult medical examination without abnormal findings: Secondary | ICD-10-CM | POA: Diagnosis not present

## 2023-04-08 DIAGNOSIS — E7800 Pure hypercholesterolemia, unspecified: Secondary | ICD-10-CM | POA: Diagnosis not present

## 2023-04-08 DIAGNOSIS — Z79899 Other long term (current) drug therapy: Secondary | ICD-10-CM | POA: Diagnosis not present

## 2023-04-08 DIAGNOSIS — I10 Essential (primary) hypertension: Secondary | ICD-10-CM | POA: Diagnosis not present

## 2023-04-08 DIAGNOSIS — Z299 Encounter for prophylactic measures, unspecified: Secondary | ICD-10-CM | POA: Diagnosis not present

## 2023-04-10 ENCOUNTER — Other Ambulatory Visit: Payer: Self-pay | Admitting: Cardiology

## 2023-04-22 DIAGNOSIS — Z299 Encounter for prophylactic measures, unspecified: Secondary | ICD-10-CM | POA: Diagnosis not present

## 2023-04-22 DIAGNOSIS — C44529 Squamous cell carcinoma of skin of other part of trunk: Secondary | ICD-10-CM | POA: Diagnosis not present

## 2023-04-23 DIAGNOSIS — D045 Carcinoma in situ of skin of trunk: Secondary | ICD-10-CM | POA: Diagnosis not present

## 2023-04-23 DIAGNOSIS — D485 Neoplasm of uncertain behavior of skin: Secondary | ICD-10-CM | POA: Diagnosis not present

## 2023-04-29 DIAGNOSIS — Z299 Encounter for prophylactic measures, unspecified: Secondary | ICD-10-CM | POA: Diagnosis not present

## 2023-04-29 DIAGNOSIS — M25511 Pain in right shoulder: Secondary | ICD-10-CM | POA: Diagnosis not present

## 2023-05-01 ENCOUNTER — Ambulatory Visit: Payer: Medicare Other | Admitting: Cardiology

## 2023-05-01 NOTE — Progress Notes (Deleted)
Clinical Summary Mr. Covin is a 67 y.o.male  seen today for follow up of the following medical problems.        1. CAD/Chronic systolic HF -  06/2009 CABG x 3 (LIMA-LAD, SVG-diag, SVG-PDA). He presented with an anterior MI.  - from prior clinic notes, LVEF 40% (do not see echo report) at that time.   -11/2016 echo that showed LVEF 15%, diffuse hypokinesis.  - Jan 2018 cath as reported below, received DES to distal SVG-rPDA.  RHC with CI 2, mean PA 39, PCWP 29, LVEDP 22 - 10/2017 echo LVEF 25% - he has turned down ICD - not interested in cardiac rehab.   -  intervention cards recs for indefinitie brillinta.     - borderline high K previously, have not started aldactone.     11/2021 echo: LVEF 35-40%, grade I dd, mild RV dysfunction,        No chest pains. Chronic SOB/DOE unchanged, using home O2 at home - compliant with meds. No LE edema. Has not needed prn lasix.        2. SOB/COPD -follwoed by pcp - using home O2   s. Chronic wheezing. - some ongoing DOE.    No recent cough, no wheezing, Compliant with inhalers - still smoking. Missed his pulmonary appt       3. PAD - followed by vascular   Jan 2021 ABI  Vasc US 50-74% bilateral SFA disease, ABIs R 0.88 L 0.95   4. Carotid stenosis -01/2018 carotid US: RICA 1-39%, LICA 60-79%.   - 01/2019 carotid US: RICA 4-09%, LIC 81-19% - 12/4780 carotid US: RICA 1-39%, LICA 60-79% - 08/2021 RICA 1-39%, LICA 60-79%   5. HTN  -compliant with meds   6. Aflutter,paroxysmal - new diagnosis 05/2021 during admission to [facility] with COPD exacerbation     - no palpitations - no bleeding on eliquis.  Past Medical History:  Diagnosis Date   Anterior myocardial infarction [facility]) 06/2009   [name] 12/16/2016   Anxiety    Anxiety disorder    Atrial fibrillation (HCC)    Chest pain    Chronic brain syndrome    Elevated blood pressure    Tobacco abuse      Allergies  Allergen Reactions   Chocolate Hazelnut Flavor       Current Outpatient Medications  Medication Sig Dispense Refill   amoxicillin (AMOXIL) 500 MG capsule Take 500 mg by mouth every 8 (eight) hours.     apixaban (ELIQUIS) 5 MG TABS tablet Take 1 tablet (5 mg total) by mouth 2 (two) times daily. 60 tablet 5   atorvastatin (LIPITOR) 80 MG tablet Take 1 tablet by mouth once daily 30 tablet 6   dapagliflozin propanediol (FARXIGA) 10 MG TABS tablet Take 1 tablet (10 mg total) by mouth daily before breakfast. 90 tablet 1   digoxin (LANOXIN) 0.125 MG tablet Take 125 mcg by mouth every morning.     furosemide (LASIX) 20 MG tablet TAKE ONE TABLET BY MOUTH ONCE DAILY AS NEEDED (FOR WEIGHT GAIN OF 3 POUNDS IN 24 HOURS OR 5 POUNDS IN ONE WEEK) 90 tablet 1   Ipratropium-Albuterol (COMBIVENT RESPIMAT) 20-100 MCG/ACT AERS respimat Inhale 1 puff into the lungs every 6 (six) hours.     metoprolol (TOPROL-XL) 200 MG 24 hr tablet TAKE ONE TABLET BY MOUTH DAILY 90 tablet 2   nitroGLYCERIN (NITROSTAT) 0.4 MG SL tablet DISSOLVE ONE TABLET UNDER TONGUE EVERY 5 MINUTES UP TO 3 DOSES  AS NEEDED FOR CHEST PAIN 25 tablet 2   nystatin (MYCOSTATIN) 100000 UNIT/ML suspension Take 5 mLs by mouth 4 (four) times daily.     PROAIR HFA 108 (90 Base) MCG/ACT inhaler Inhale 2 puffs into the lungs 4 (four) times daily as needed for shortness of breath.      sacubitril-valsartan (ENTRESTO) 49-51 MG Take 1 tablet by mouth 2 (two) times daily. 180 tablet 1   TRELEGY ELLIPTA 100-62.5-25 MCG/INH AEPB Inhale 1 puff into the lungs daily.     TUSSIN DM 100-10 MG/5ML liquid Take 5 mLs by mouth every 4 (four) hours as needed.     No current facility-administered medications for this visit.     Past Surgical History:  Procedure Laterality Date   CARDIAC CATHETERIZATION  06/2009   [name] 12/16/2016   CARDIAC CATHETERIZATION N/A 12/18/2016   Procedure: Right/Left Heart Cath and Coronary/Graft Angiography;  Surgeon: Yvonne Kendall, MD;  Location: [facility] INVASIVE CV LAB;  Service:  Cardiovascular;  Laterality: N/A;   CARDIAC CATHETERIZATION N/A 12/18/2016   Procedure: Coronary Stent Intervention;  Surgeon: Yvonne Kendall, MD;  Location: [facility] INVASIVE CV LAB;  Service: Cardiovascular;  Laterality: N/A;  SVG-PDA   CORONARY ANGIOPLASTY     CORONARY ARTERY BYPASS GRAFT  2010   X3   LAPAROSCOPIC CHOLECYSTECTOMY  2007     Allergies  Allergen Reactions   Chocolate Hazelnut Flavor       Family History  Problem Relation Age of Onset   Coronary artery disease Other      Social History Mr. Reggio reports that he has been smoking cigarettes. He started smoking about 52 years ago. He has a 44.00 pack-year smoking history. He has never used smokeless tobacco. Mr. Baun reports no history of alcohol use.   Review of Systems CONSTITUTIONAL: No weight loss, fever, chills, weakness or fatigue.  HEENT: Eyes: No visual loss, blurred vision, double vision or yellow sclerae.No hearing loss, sneezing, congestion, runny nose or sore throat.  SKIN: No rash or itching.  CARDIOVASCULAR:  RESPIRATORY: No shortness of breath, cough or sputum.  GASTROINTESTINAL: No anorexia, nausea, vomiting or diarrhea. No abdominal pain or blood.  GENITOURINARY: No burning on urination, no polyuria NEUROLOGICAL: No headache, dizziness, syncope, paralysis, ataxia, numbness or tingling in the extremities. No change in bowel or bladder control.  MUSCULOSKELETAL: No muscle, back pain, joint pain or stiffness.  LYMPHATICS: No enlarged nodes. No history of splenectomy.  PSYCHIATRIC: No history of depression or anxiety.  ENDOCRINOLOGIC: No reports of sweating, cold or heat intolerance. No polyuria or polydipsia.  Marland Kitchen   Physical Examination There were no vitals filed for this visit. There were no vitals filed for this visit.  Gen: resting comfortably, no acute distress HEENT: no scleral icterus, pupils equal round and reactive, no palptable cervical adenopathy,  CV Resp: Clear to auscultation  bilaterally GI: abdomen is soft, non-tender, non-distended, normal bowel sounds, no hepatosplenomegaly MSK: extremities are warm, no edema.  Skin: warm, no rash Neuro:  no focal deficits Psych: appropriate affect   Diagnostic Studies  11/2021 echo IMPRESSIONS     1. Left ventricular ejection fraction, by estimation, is 35 to 40%. The  left ventricle has moderately decreased function. The left ventricle  demonstrates regional wall motion abnormalities (see scoring  diagram/findings for description). The left  ventricular internal cavity size was mildly dilated. Left ventricular  diastolic parameters are consistent with Grade I diastolic dysfunction  (impaired relaxation).   2. Right ventricular systolic function is mildly reduced. The right  ventricular size is normal. There is normal pulmonary artery systolic  pressure. The estimated right ventricular systolic pressure is 27.2 mmHg.   3. Left atrial size was upper normal.   4. The mitral valve is grossly normal. Trivial mitral valve  regurgitation.   5. The aortic valve is tricuspid. Aortic valve regurgitation is not  visualized.   6. The inferior vena cava is normal in size with greater than 50%  respiratory variability, suggesting right atrial pressure of 3 mmHg.        Assessment and Plan  1. CAD/ICM/Chronic systolic HF - euvolemic. Soft bps with some dizziness at times, lower entresto to 49/51mg  bid. With soft bp's and prior borderline high K have not started aldactone.    2. COPD  - ongoing significant SOB/DOE - refer to pulm, missed his last appt   3. Aflutter, acquired thrombophilia - paroxysmal,no symptoms - continue current meds including eliquis for stroke prevention      Antoine Poche, M.D., F.A.C.C.

## 2023-05-29 DIAGNOSIS — G253 Myoclonus: Secondary | ICD-10-CM | POA: Diagnosis not present

## 2023-05-29 DIAGNOSIS — R64 Cachexia: Secondary | ICD-10-CM | POA: Diagnosis not present

## 2023-05-29 DIAGNOSIS — R5383 Other fatigue: Secondary | ICD-10-CM | POA: Diagnosis not present

## 2023-05-29 DIAGNOSIS — Z79899 Other long term (current) drug therapy: Secondary | ICD-10-CM | POA: Diagnosis not present

## 2023-05-29 DIAGNOSIS — Z299 Encounter for prophylactic measures, unspecified: Secondary | ICD-10-CM | POA: Diagnosis not present

## 2023-05-29 DIAGNOSIS — F17210 Nicotine dependence, cigarettes, uncomplicated: Secondary | ICD-10-CM | POA: Diagnosis not present

## 2023-05-29 DIAGNOSIS — J449 Chronic obstructive pulmonary disease, unspecified: Secondary | ICD-10-CM | POA: Diagnosis not present

## 2023-06-04 DIAGNOSIS — G253 Myoclonus: Secondary | ICD-10-CM | POA: Insufficient documentation

## 2023-06-04 DIAGNOSIS — Z7952 Long term (current) use of systemic steroids: Secondary | ICD-10-CM | POA: Diagnosis not present

## 2023-06-04 DIAGNOSIS — Z7902 Long term (current) use of antithrombotics/antiplatelets: Secondary | ICD-10-CM | POA: Diagnosis not present

## 2023-06-04 DIAGNOSIS — I959 Hypotension, unspecified: Secondary | ICD-10-CM | POA: Diagnosis not present

## 2023-06-04 DIAGNOSIS — Z91018 Allergy to other foods: Secondary | ICD-10-CM | POA: Diagnosis not present

## 2023-06-04 DIAGNOSIS — J441 Chronic obstructive pulmonary disease with (acute) exacerbation: Secondary | ICD-10-CM | POA: Diagnosis not present

## 2023-06-04 DIAGNOSIS — Z7982 Long term (current) use of aspirin: Secondary | ICD-10-CM | POA: Diagnosis not present

## 2023-06-04 DIAGNOSIS — Z792 Long term (current) use of antibiotics: Secondary | ICD-10-CM | POA: Diagnosis not present

## 2023-06-04 DIAGNOSIS — Z9981 Dependence on supplemental oxygen: Secondary | ICD-10-CM | POA: Diagnosis not present

## 2023-06-04 DIAGNOSIS — Z720 Tobacco use: Secondary | ICD-10-CM | POA: Diagnosis not present

## 2023-06-04 DIAGNOSIS — R001 Bradycardia, unspecified: Secondary | ICD-10-CM | POA: Diagnosis not present

## 2023-06-04 DIAGNOSIS — I10 Essential (primary) hypertension: Secondary | ICD-10-CM | POA: Diagnosis not present

## 2023-06-04 DIAGNOSIS — R0602 Shortness of breath: Secondary | ICD-10-CM | POA: Diagnosis not present

## 2023-06-04 DIAGNOSIS — Z79899 Other long term (current) drug therapy: Secondary | ICD-10-CM | POA: Diagnosis not present

## 2023-06-04 DIAGNOSIS — Z7901 Long term (current) use of anticoagulants: Secondary | ICD-10-CM | POA: Diagnosis not present

## 2023-06-04 DIAGNOSIS — R0689 Other abnormalities of breathing: Secondary | ICD-10-CM | POA: Diagnosis not present

## 2023-06-04 DIAGNOSIS — I491 Atrial premature depolarization: Secondary | ICD-10-CM | POA: Diagnosis not present

## 2023-06-04 DIAGNOSIS — R062 Wheezing: Secondary | ICD-10-CM | POA: Diagnosis not present

## 2023-06-04 DIAGNOSIS — E785 Hyperlipidemia, unspecified: Secondary | ICD-10-CM | POA: Diagnosis not present

## 2023-06-04 DIAGNOSIS — I252 Old myocardial infarction: Secondary | ICD-10-CM | POA: Diagnosis not present

## 2023-06-04 DIAGNOSIS — J9622 Acute and chronic respiratory failure with hypercapnia: Secondary | ICD-10-CM | POA: Insufficient documentation

## 2023-06-04 DIAGNOSIS — I110 Hypertensive heart disease with heart failure: Secondary | ICD-10-CM | POA: Diagnosis not present

## 2023-06-04 DIAGNOSIS — I2510 Atherosclerotic heart disease of native coronary artery without angina pectoris: Secondary | ICD-10-CM | POA: Diagnosis not present

## 2023-06-04 DIAGNOSIS — F17210 Nicotine dependence, cigarettes, uncomplicated: Secondary | ICD-10-CM | POA: Diagnosis not present

## 2023-06-04 DIAGNOSIS — Z9989 Dependence on other enabling machines and devices: Secondary | ICD-10-CM | POA: Diagnosis not present

## 2023-06-04 DIAGNOSIS — Z7951 Long term (current) use of inhaled steroids: Secondary | ICD-10-CM | POA: Diagnosis not present

## 2023-06-04 DIAGNOSIS — Z951 Presence of aortocoronary bypass graft: Secondary | ICD-10-CM | POA: Diagnosis not present

## 2023-06-04 DIAGNOSIS — I509 Heart failure, unspecified: Secondary | ICD-10-CM | POA: Diagnosis not present

## 2023-06-04 DIAGNOSIS — J439 Emphysema, unspecified: Secondary | ICD-10-CM | POA: Diagnosis not present

## 2023-06-04 DIAGNOSIS — Z8679 Personal history of other diseases of the circulatory system: Secondary | ICD-10-CM | POA: Diagnosis not present

## 2023-06-04 DIAGNOSIS — I5020 Unspecified systolic (congestive) heart failure: Secondary | ICD-10-CM | POA: Diagnosis not present

## 2023-06-10 DIAGNOSIS — I700 Atherosclerosis of aorta: Secondary | ICD-10-CM | POA: Diagnosis not present

## 2023-06-10 DIAGNOSIS — J449 Chronic obstructive pulmonary disease, unspecified: Secondary | ICD-10-CM | POA: Diagnosis not present

## 2023-06-10 DIAGNOSIS — I10 Essential (primary) hypertension: Secondary | ICD-10-CM | POA: Diagnosis not present

## 2023-06-10 DIAGNOSIS — F17210 Nicotine dependence, cigarettes, uncomplicated: Secondary | ICD-10-CM | POA: Diagnosis not present

## 2023-06-10 DIAGNOSIS — Z299 Encounter for prophylactic measures, unspecified: Secondary | ICD-10-CM | POA: Diagnosis not present

## 2023-06-10 DIAGNOSIS — Z09 Encounter for follow-up examination after completed treatment for conditions other than malignant neoplasm: Secondary | ICD-10-CM | POA: Diagnosis not present

## 2023-06-10 DIAGNOSIS — D6869 Other thrombophilia: Secondary | ICD-10-CM | POA: Diagnosis not present

## 2023-06-17 DIAGNOSIS — I10 Essential (primary) hypertension: Secondary | ICD-10-CM | POA: Diagnosis not present

## 2023-06-17 DIAGNOSIS — J9602 Acute respiratory failure with hypercapnia: Secondary | ICD-10-CM | POA: Diagnosis not present

## 2023-06-17 DIAGNOSIS — J189 Pneumonia, unspecified organism: Secondary | ICD-10-CM | POA: Insufficient documentation

## 2023-06-17 DIAGNOSIS — E785 Hyperlipidemia, unspecified: Secondary | ICD-10-CM | POA: Diagnosis not present

## 2023-06-17 DIAGNOSIS — Z792 Long term (current) use of antibiotics: Secondary | ICD-10-CM | POA: Diagnosis not present

## 2023-06-17 DIAGNOSIS — I480 Paroxysmal atrial fibrillation: Secondary | ICD-10-CM | POA: Diagnosis not present

## 2023-06-17 DIAGNOSIS — F17210 Nicotine dependence, cigarettes, uncomplicated: Secondary | ICD-10-CM | POA: Diagnosis not present

## 2023-06-17 DIAGNOSIS — J9622 Acute and chronic respiratory failure with hypercapnia: Secondary | ICD-10-CM | POA: Diagnosis not present

## 2023-06-17 DIAGNOSIS — Z9981 Dependence on supplemental oxygen: Secondary | ICD-10-CM | POA: Diagnosis not present

## 2023-06-17 DIAGNOSIS — R918 Other nonspecific abnormal finding of lung field: Secondary | ICD-10-CM | POA: Diagnosis not present

## 2023-06-17 DIAGNOSIS — I739 Peripheral vascular disease, unspecified: Secondary | ICD-10-CM | POA: Diagnosis not present

## 2023-06-17 DIAGNOSIS — Z9989 Dependence on other enabling machines and devices: Secondary | ICD-10-CM | POA: Diagnosis not present

## 2023-06-17 DIAGNOSIS — J449 Chronic obstructive pulmonary disease, unspecified: Secondary | ICD-10-CM | POA: Diagnosis not present

## 2023-06-17 DIAGNOSIS — J441 Chronic obstructive pulmonary disease with (acute) exacerbation: Secondary | ICD-10-CM | POA: Diagnosis not present

## 2023-06-17 DIAGNOSIS — Z79899 Other long term (current) drug therapy: Secondary | ICD-10-CM | POA: Diagnosis not present

## 2023-06-17 DIAGNOSIS — Z7901 Long term (current) use of anticoagulants: Secondary | ICD-10-CM | POA: Diagnosis not present

## 2023-06-17 DIAGNOSIS — J9610 Chronic respiratory failure, unspecified whether with hypoxia or hypercapnia: Secondary | ICD-10-CM | POA: Diagnosis not present

## 2023-06-17 DIAGNOSIS — J432 Centrilobular emphysema: Secondary | ICD-10-CM | POA: Diagnosis not present

## 2023-06-17 DIAGNOSIS — I110 Hypertensive heart disease with heart failure: Secondary | ICD-10-CM | POA: Diagnosis not present

## 2023-06-17 DIAGNOSIS — R0600 Dyspnea, unspecified: Secondary | ICD-10-CM | POA: Diagnosis not present

## 2023-06-17 DIAGNOSIS — R0689 Other abnormalities of breathing: Secondary | ICD-10-CM | POA: Diagnosis not present

## 2023-06-17 DIAGNOSIS — J440 Chronic obstructive pulmonary disease with acute lower respiratory infection: Secondary | ICD-10-CM | POA: Diagnosis not present

## 2023-06-17 DIAGNOSIS — J439 Emphysema, unspecified: Secondary | ICD-10-CM | POA: Diagnosis not present

## 2023-06-17 DIAGNOSIS — I5022 Chronic systolic (congestive) heart failure: Secondary | ICD-10-CM | POA: Diagnosis not present

## 2023-06-17 DIAGNOSIS — Z7982 Long term (current) use of aspirin: Secondary | ICD-10-CM | POA: Diagnosis not present

## 2023-06-17 DIAGNOSIS — R069 Unspecified abnormalities of breathing: Secondary | ICD-10-CM | POA: Diagnosis not present

## 2023-07-19 DIAGNOSIS — R61 Generalized hyperhidrosis: Secondary | ICD-10-CM | POA: Diagnosis not present

## 2023-07-19 DIAGNOSIS — R0689 Other abnormalities of breathing: Secondary | ICD-10-CM | POA: Diagnosis not present

## 2023-07-19 DIAGNOSIS — J441 Chronic obstructive pulmonary disease with (acute) exacerbation: Secondary | ICD-10-CM | POA: Diagnosis not present

## 2023-07-19 DIAGNOSIS — I509 Heart failure, unspecified: Secondary | ICD-10-CM | POA: Diagnosis not present

## 2023-07-19 DIAGNOSIS — I110 Hypertensive heart disease with heart failure: Secondary | ICD-10-CM | POA: Diagnosis not present

## 2023-07-19 DIAGNOSIS — I469 Cardiac arrest, cause unspecified: Secondary | ICD-10-CM | POA: Diagnosis not present

## 2023-07-19 DIAGNOSIS — F17210 Nicotine dependence, cigarettes, uncomplicated: Secondary | ICD-10-CM | POA: Diagnosis not present

## 2023-07-19 DIAGNOSIS — E785 Hyperlipidemia, unspecified: Secondary | ICD-10-CM | POA: Diagnosis not present

## 2023-07-19 DIAGNOSIS — J9602 Acute respiratory failure with hypercapnia: Secondary | ICD-10-CM | POA: Diagnosis not present

## 2023-07-19 DIAGNOSIS — R0602 Shortness of breath: Secondary | ICD-10-CM | POA: Diagnosis not present

## 2023-07-19 DIAGNOSIS — R001 Bradycardia, unspecified: Secondary | ICD-10-CM | POA: Diagnosis not present

## 2023-07-19 DIAGNOSIS — R0902 Hypoxemia: Secondary | ICD-10-CM | POA: Diagnosis not present

## 2023-07-19 DIAGNOSIS — Z720 Tobacco use: Secondary | ICD-10-CM | POA: Diagnosis not present

## 2023-08-25 ENCOUNTER — Other Ambulatory Visit: Payer: Self-pay | Admitting: Cardiology

## 2023-08-25 DIAGNOSIS — I4892 Unspecified atrial flutter: Secondary | ICD-10-CM

## 2023-08-25 NOTE — Telephone Encounter (Signed)
Pt last saw Dr Wyline Mood 06/18/22, pt is overdue for follow-up.  Pt was due back in 6 months for follow-up.  Pt No showed for follow-up on 01/05/23 and on 05/01/23.  Sent message to schedulers pt is overdue for follow-up and requesting refills.  Age 67, weight 80.5kg, last labs 06/20/23 Creat 0.58, based on specified criteria pt is on appropriate dosage of Eliquis 5mg  BID for aflutter. Will await appt to refill rx.

## 2023-08-26 NOTE — Telephone Encounter (Signed)
Eliquis 5mg  refill request received. Patient is 67 years old, weight-80.6kg, Crea-0.58 on 06/20/23 via Care Everywhere from [facility], Diagnosis-, and last seen by Dave [name] on 10/23/22 for Preop & pending appt for Dave [name] on 09/25/23. Dose is appropriate based on dosing criteria. Will send in refill to requested pharmacy.

## 2023-09-07 DIAGNOSIS — Z720 Tobacco use: Secondary | ICD-10-CM | POA: Diagnosis not present

## 2023-09-07 DIAGNOSIS — I4891 Unspecified atrial fibrillation: Secondary | ICD-10-CM | POA: Diagnosis not present

## 2023-09-07 DIAGNOSIS — J9622 Acute and chronic respiratory failure with hypercapnia: Secondary | ICD-10-CM | POA: Diagnosis not present

## 2023-09-07 DIAGNOSIS — F17210 Nicotine dependence, cigarettes, uncomplicated: Secondary | ICD-10-CM | POA: Diagnosis not present

## 2023-09-07 DIAGNOSIS — Z7951 Long term (current) use of inhaled steroids: Secondary | ICD-10-CM | POA: Diagnosis not present

## 2023-09-07 DIAGNOSIS — I5022 Chronic systolic (congestive) heart failure: Secondary | ICD-10-CM | POA: Diagnosis not present

## 2023-09-07 DIAGNOSIS — J984 Other disorders of lung: Secondary | ICD-10-CM | POA: Diagnosis not present

## 2023-09-07 DIAGNOSIS — I509 Heart failure, unspecified: Secondary | ICD-10-CM | POA: Diagnosis not present

## 2023-09-07 DIAGNOSIS — E119 Type 2 diabetes mellitus without complications: Secondary | ICD-10-CM | POA: Diagnosis not present

## 2023-09-07 DIAGNOSIS — E785 Hyperlipidemia, unspecified: Secondary | ICD-10-CM | POA: Diagnosis not present

## 2023-09-07 DIAGNOSIS — Z9102 Food additives allergy status: Secondary | ICD-10-CM | POA: Diagnosis not present

## 2023-09-07 DIAGNOSIS — Z20822 Contact with and (suspected) exposure to covid-19: Secondary | ICD-10-CM | POA: Diagnosis not present

## 2023-09-07 DIAGNOSIS — I10 Essential (primary) hypertension: Secondary | ICD-10-CM | POA: Diagnosis not present

## 2023-09-07 DIAGNOSIS — I252 Old myocardial infarction: Secondary | ICD-10-CM | POA: Diagnosis not present

## 2023-09-07 DIAGNOSIS — Z7901 Long term (current) use of anticoagulants: Secondary | ICD-10-CM | POA: Diagnosis not present

## 2023-09-07 DIAGNOSIS — J441 Chronic obstructive pulmonary disease with (acute) exacerbation: Secondary | ICD-10-CM | POA: Diagnosis not present

## 2023-09-07 DIAGNOSIS — Z7984 Long term (current) use of oral hypoglycemic drugs: Secondary | ICD-10-CM | POA: Diagnosis not present

## 2023-09-07 DIAGNOSIS — Z716 Tobacco abuse counseling: Secondary | ICD-10-CM | POA: Diagnosis not present

## 2023-09-07 DIAGNOSIS — J9602 Acute respiratory failure with hypercapnia: Secondary | ICD-10-CM | POA: Diagnosis not present

## 2023-09-07 DIAGNOSIS — Z1152 Encounter for screening for COVID-19: Secondary | ICD-10-CM | POA: Diagnosis not present

## 2023-09-07 DIAGNOSIS — I110 Hypertensive heart disease with heart failure: Secondary | ICD-10-CM | POA: Diagnosis not present

## 2023-09-07 DIAGNOSIS — Z79899 Other long term (current) drug therapy: Secondary | ICD-10-CM | POA: Diagnosis not present

## 2023-09-07 DIAGNOSIS — J439 Emphysema, unspecified: Secondary | ICD-10-CM | POA: Diagnosis not present

## 2023-09-07 DIAGNOSIS — I340 Nonrheumatic mitral (valve) insufficiency: Secondary | ICD-10-CM | POA: Diagnosis not present

## 2023-09-07 DIAGNOSIS — I700 Atherosclerosis of aorta: Secondary | ICD-10-CM | POA: Diagnosis not present

## 2023-09-07 DIAGNOSIS — Z23 Encounter for immunization: Secondary | ICD-10-CM | POA: Diagnosis not present

## 2023-09-07 DIAGNOSIS — Z7982 Long term (current) use of aspirin: Secondary | ICD-10-CM | POA: Diagnosis not present

## 2023-09-07 DIAGNOSIS — J40 Bronchitis, not specified as acute or chronic: Secondary | ICD-10-CM | POA: Diagnosis not present

## 2023-09-07 DIAGNOSIS — R918 Other nonspecific abnormal finding of lung field: Secondary | ICD-10-CM | POA: Diagnosis not present

## 2023-09-07 DIAGNOSIS — J9601 Acute respiratory failure with hypoxia: Secondary | ICD-10-CM | POA: Diagnosis not present

## 2023-09-07 DIAGNOSIS — R0602 Shortness of breath: Secondary | ICD-10-CM | POA: Diagnosis not present

## 2023-09-07 DIAGNOSIS — Z951 Presence of aortocoronary bypass graft: Secondary | ICD-10-CM | POA: Diagnosis not present

## 2023-09-07 DIAGNOSIS — R0902 Hypoxemia: Secondary | ICD-10-CM | POA: Diagnosis not present

## 2023-09-07 DIAGNOSIS — J449 Chronic obstructive pulmonary disease, unspecified: Secondary | ICD-10-CM | POA: Diagnosis not present

## 2023-09-14 DIAGNOSIS — Z09 Encounter for follow-up examination after completed treatment for conditions other than malignant neoplasm: Secondary | ICD-10-CM | POA: Diagnosis not present

## 2023-09-14 DIAGNOSIS — J9611 Chronic respiratory failure with hypoxia: Secondary | ICD-10-CM | POA: Diagnosis not present

## 2023-09-14 DIAGNOSIS — J449 Chronic obstructive pulmonary disease, unspecified: Secondary | ICD-10-CM | POA: Diagnosis not present

## 2023-09-25 ENCOUNTER — Encounter: Payer: Self-pay | Admitting: Nurse Practitioner

## 2023-09-25 ENCOUNTER — Ambulatory Visit: Payer: Medicare Other | Attending: Nurse Practitioner | Admitting: Nurse Practitioner

## 2023-09-25 VITALS — BP 133/57 | HR 56 | Ht 70.0 in | Wt 156.6 lb

## 2023-09-25 DIAGNOSIS — I2510 Atherosclerotic heart disease of native coronary artery without angina pectoris: Secondary | ICD-10-CM | POA: Diagnosis not present

## 2023-09-25 DIAGNOSIS — E785 Hyperlipidemia, unspecified: Secondary | ICD-10-CM | POA: Insufficient documentation

## 2023-09-25 DIAGNOSIS — R0602 Shortness of breath: Secondary | ICD-10-CM

## 2023-09-25 DIAGNOSIS — I739 Peripheral vascular disease, unspecified: Secondary | ICD-10-CM | POA: Diagnosis not present

## 2023-09-25 DIAGNOSIS — J449 Chronic obstructive pulmonary disease, unspecified: Secondary | ICD-10-CM | POA: Insufficient documentation

## 2023-09-25 DIAGNOSIS — I4892 Unspecified atrial flutter: Secondary | ICD-10-CM | POA: Diagnosis not present

## 2023-09-25 DIAGNOSIS — I10 Essential (primary) hypertension: Secondary | ICD-10-CM | POA: Diagnosis not present

## 2023-09-25 DIAGNOSIS — I4891 Unspecified atrial fibrillation: Secondary | ICD-10-CM | POA: Diagnosis not present

## 2023-09-25 DIAGNOSIS — I255 Ischemic cardiomyopathy: Secondary | ICD-10-CM | POA: Insufficient documentation

## 2023-09-25 DIAGNOSIS — I6523 Occlusion and stenosis of bilateral carotid arteries: Secondary | ICD-10-CM | POA: Diagnosis not present

## 2023-09-25 DIAGNOSIS — I5022 Chronic systolic (congestive) heart failure: Secondary | ICD-10-CM | POA: Diagnosis not present

## 2023-09-25 DIAGNOSIS — Z87891 Personal history of nicotine dependence: Secondary | ICD-10-CM | POA: Diagnosis not present

## 2023-09-25 NOTE — Patient Instructions (Addendum)
Medication Instructions:  Your physician recommends that you continue on your current medications as directed. Please refer to the Current Medication list given to you today.  Labwork: None   Testing/Procedures: None   Follow-Up: Your physician recommends that you schedule a follow-up appointment in: 4-6 weeks   Any Other Special Instructions Will Be Listed Below (If Applicable).  If you need a refill on your cardiac medications before your next appointment, please call your pharmacy.

## 2023-09-25 NOTE — Progress Notes (Unsigned)
Cardiology Office Note:  .   Date:  09/25/2023 ID:  Dave [name], DOB 1956-04-10, MRN [id number] PCP: Ignatius Specking, MD   HeartCare Providers Cardiologist:  [name], MD    History of Present Illness: .   Dave [name] is a 67 y.o. male with a PMH of CAD, s/p CABG x 3 in 2010, A-fib, PAD, chronic systolic CHF, ICM, carotid artery stenosis, hypertension, former smoker, COPD, who presents today for overdue 50-month follow-up appointment.  Last seen for preoperative cardiovascular risk assessment on October 23, 2022 with Bernadene Person, NP.  He was pending dental extraction at that time.  Was overall doing well at the time.  Last seen by Dr. [name] on June 18, 2022, had noted ongoing significant shortness of breath/dyspnea on exertion and was referred to pulmonary -missed his last appointment, had some soft BPs with some dizziness at times, Entresto was lowered to 49/51 mg twice daily, due to borderline high potassium levels, Aldactone was not started.  In the interim, he has had several ED visits/hospital admissions.  More recently, he has had a recent hospital admission at [facility] for COPD exacerbation.  CT scan revealed stable right lower lobe nodule.  Echocardiogram in hospital revealed EF reduced at 35 to 40% with repeat echo revealing worsening EF to 25 to 30%.  Recommended to follow-up outpatient cardiologist.  Today presents for outpatient follow-up.  He presents today with his family member.  He is doing well, and states he quit smoking 18 days ago.  Family member says normally he would be wearing oxygen for his office visit, but now he does not need to wear it, does wear it PRN.  They are requesting a referral to pulmonology as he is overdue for an office visit with his pulmonologist. Denies any chest pain, shortness of breath, palpitations, syncope, presyncope, dizziness, orthopnea, PND, swelling or significant weight changes, acute bleeding, or claudication.  ROS:  Negative.  See HPI.  Studies Reviewed: Marland Kitchen    EKG: EKG Interpretation Date/Time:  Friday September 25 2023 14:22:35 EDT Ventricular Rate:  73 PR Interval:    QRS Duration:  122 QT Interval:  408 QTC Calculation: 449 R Axis:   86  Text Interpretation: Atrial fibrillation with premature ventricular or aberrantly conducted complexes Anteroseptal infarct (cited on or before 19-Dec-2016) T wave abnormality, consider inferior ischemia When compared with ECG of 19-Dec-2016 04:34, Atrial fibrillation has replaced Sinus rhythm ST elevation now present in Anterior leads T wave inversion no longer evident in Lateral leads Confirmed by Sharlene Dory [phone]) on 09/25/2023 2:42:47 PM    2D echocardiogram 09/2023 [facility]): 1. The left ventricle is moderately dilated in size with normal wall thickness. 2. The left ventricular systolic function is severely decreased, LVEF is visually estimated at 25-30%. 3. There is mild mitral valve regurgitation. 4. The aortic valve is trileaflet with mildly thickened leaflets with normal excursion. 5. The left atrium is mildly dilated in size. 6. The right ventricle is mildly dilated in size, with mildly reduced systolic function. 7. IVC size and inspiratory change suggest elevated right atrial pressure. (10-20 mmHg). Left Ventricle The left ventricle is moderately dilated in size with normal wall thickness. The left ventricular systolic function is severely decreased, LVEF is visually estimated at 25-30%. Left ventricular diastolic function cannot be accurately assessed. Right Ventricle The right ventricle is mildly dilated in size, with mildly reduced systolic function. Left Atrium The left atrium is mildly dilated in size. Right Atrium The right atrium  is normal in size. Aortic Valve The aortic valve is trileaflet with mildly thickened leaflets with normal excursion. There is trivial aortic regurgitation. There is no evidence of a significant transvalvular gradient. Mitral Valve  The mitral valve leaflets are normal with normal leaflet mobility. There is mild mitral valve regurgitation. Tricuspid Valve The tricuspid valve leaflets are normal, with normal leaflet mobility. There is no significant tricuspid regurgitation. The pulmonary systolic pressure cannot be estimated due to insufficient TR signal. Pulmonic Valve Pulmonary valve is not well visualized. There is no significant pulmonic regurgitation. There is no evidence of a significant transvalvular gradient. Aorta The aorta is normal in size in the visualized segments. Inferior Vena Cava IVC size and inspiratory change suggest elevated right atrial pressure. (10-20 mmHg). Pericardium/Pleural There is no pericardial effusion.  Carotid duplex 08/2022: Summary:  Right Carotid: Velocities in the right ICA are consistent with a 1-39%  stenosis.                The ECA appears >50% stenosed.   Left Carotid: Velocities in the left ICA are consistent with a 40-59%  stenosis.               Non-hemodynamically significant plaque <50% noted in the  CCA. The                ECA appears >50% stenosed.   Vertebrals:  Bilateral vertebral arteries demonstrate antegrade flow.  Subclavians: Normal flow hemodynamics were seen in bilateral subclavian               arteries.   *See table(s) above for measurements and observations.  Suggest follow up study in 12 months.  Echo 11/2021: 1. Left ventricular ejection fraction, by estimation, is 35 to 40%. The  left ventricle has moderately decreased function. The left ventricle  demonstrates regional wall motion abnormalities (see scoring  diagram/findings for description). The left  ventricular internal cavity size was mildly dilated. Left ventricular  diastolic parameters are consistent with Grade I diastolic dysfunction  (impaired relaxation).   2. Right ventricular systolic function is mildly reduced. The right  ventricular size is normal. There is normal pulmonary artery systolic   pressure. The estimated right ventricular systolic pressure is 27.2 mmHg.   3. Left atrial size was upper normal.   4. The mitral valve is grossly normal. Trivial mitral valve  regurgitation.   5. The aortic valve is tricuspid. Aortic valve regurgitation is not  visualized.   6. The inferior vena cava is normal in size with greater than 50%  respiratory variability, suggesting right atrial pressure of 3 mmHg.   Comparison(s): Prior images unable to be directly viewed.  Right/left heart cath 12/2016: Conclusions: 1.  Significant 2-vessel native coronary artery disease, including moderate diffuse proximal LAD disease and chronic total occlusion of mid LAD and ostial RCA.  70% proximal D1 stenosis is also present. 2.  Moderate, non-obstructive disease involving LCx.  Small branch of OM2 is chronically occluded. 3.  Widely patent LIMA->LAD. 4.  Patent SVG->rPDA with mild diffuse disease and discrete 95% stenosis at the distal anastomosis. 5.  Chronically occluded SVG->D1. 6.  Mildly elevated left and right heart filling pressures, as well as moderate pulmonary hypertension. 7.  Normal to mildly reduced cardiac output. 8.  Successful PCI to distal SVG->rPDA anastomosis using a Resolute Onxy 4.0 x 12 mm drug-eluting stent with 0% residual stenosis and TIMI-3 flow.   Recommendations: 1.  Admit for overnight observation following PCI with severe systolic dysfunction.  2.  Dual antiplatelet therapy with aspirin and ticagrelor for at least 6-12 months, ideally indefinitely.  If patient experiences respiratory side effects from ticagrelor, he could be switched to clopidogrel. 3.  Avoid further IV hydration today; restart diuresis tomorrow if renal function allows. 4.  Aggressive medical therapy for severe ischemic cardiomyopathy.  If renal function stable, can consider starting ACEI tomorrow. 5.  Close outpatient follow-up with Dr. Wyline Mood.  Physical Exam:   VS:  BP (!) 142/60   Pulse (!) 56   Ht  5\' 10"  (1.778 m)   Wt 156 lb 9.6 oz (71 kg)   SpO2 94%   BMI 22.47 kg/m    Wt Readings from Last 3 Encounters:  09/25/23 156 lb 9.6 oz (71 kg)  06/18/22 177 lb 12.8 oz (80.6 kg)  01/06/22 180 lb (81.6 kg)    GEN: Thin, 67 year old male in no acute distress NECK: No JVD; No carotid bruits CARDIAC: S1/S2, irregularly irregular rhythm, no murmurs, rubs, gallops RESPIRATORY:  Clear to auscultation without rales, wheezing or rhonchi  EXTREMITIES:  No edema; No deformity   ASSESSMENT AND PLAN: .    Chronic systolic CHF, ICM Stage C, NYHA class I-II symptoms.  EF recently found to be reduced to 25 to 30% at [facility] in October 2024.  GDMT has been limited in the past due to soft BPs with some dizziness at times.  Continue current GDMT.  Recommend that patient may benefit from cardiac rehab/pulmonary rehab. Will route note to attending cardiologist for further recs. Low sodium diet, fluid restriction <2L, and daily weights encouraged. Educated to contact our office for weight gain of 2 lbs overnight or 5 lbs in one week.  CAD, s/p CABG x 3 in 2010 Stable with no anginal symptoms. No indication for ischemic evaluation.  Not on aspirin due to being on Eliquis.  Continue atorvastatin, metoprolol succinate, nitroglycerin as needed. Heart healthy diet and regular cardiovascular exercise encouraged.   A-fib Denies any tachycardia or palpitations.  Heart rate well-controlled on exam.  No medication changes at this time.  Continue Eliquis for stroke prevention.  He is on appropriate dosage and denies any bleeding issues.   Carotid artery stenosis, PAD, HLD LDL 77 04/2023.  Currently not at goal.  Plan to address this at next office visit and add Zetia 10 mg daily to medication regimen.  Denies any symptoms.  Last carotid ultrasound revealed right ICA stenosis at 1 to 39%, left ICA stenosis at 40 to 59%.  He has upcoming carotid Doppler scheduled for later this month.  Continue current medication  regimen.  Hypertension Blood pressure on arrival mildly elevated, repeat BP at goal.  Discussed SBP goal is less than 140.  No medication changes at this time. Discussed to monitor BP at home at least 2 hours after medications and sitting for 5-10 minutes. Heart healthy diet and regular cardiovascular exercise encouraged.   COPD, former smoker Denies any recent worsening shortness of breath.  He is requesting referral to pulmonology.  Will provide this referral for him.  Congratulated him on his recent accomplishment on quitting smoking.   Dispo: Follow-up with me/APP in 4 to 6 weeks or sooner anything changes.  Signed, Sharlene Dory, NP

## 2023-09-28 ENCOUNTER — Encounter: Payer: Self-pay | Admitting: Nurse Practitioner

## 2023-09-29 NOTE — Progress Notes (Signed)
Cardiac rehab is fine. When you see hm back please get a limited echo reassess LV function. I hate to commit to a cath if by chance the [facility] echo read was inaccurate or if perhaps some transient stress induced dysfunction given he was admitted with COPD exacerbation. If persistent drop in LVEF then yes I thnk would need to consider cath  Dominga Ferry MD

## 2023-09-30 ENCOUNTER — Ambulatory Visit: Payer: Medicare Other | Attending: Cardiology

## 2023-09-30 DIAGNOSIS — I6523 Occlusion and stenosis of bilateral carotid arteries: Secondary | ICD-10-CM | POA: Diagnosis not present

## 2023-10-17 ENCOUNTER — Other Ambulatory Visit: Payer: Self-pay | Admitting: Cardiology

## 2023-10-21 ENCOUNTER — Telehealth: Payer: Self-pay | Admitting: *Deleted

## 2023-10-21 DIAGNOSIS — Z299 Encounter for prophylactic measures, unspecified: Secondary | ICD-10-CM | POA: Diagnosis not present

## 2023-10-21 DIAGNOSIS — I25119 Atherosclerotic heart disease of native coronary artery with unspecified angina pectoris: Secondary | ICD-10-CM | POA: Diagnosis not present

## 2023-10-21 DIAGNOSIS — I739 Peripheral vascular disease, unspecified: Secondary | ICD-10-CM | POA: Diagnosis not present

## 2023-10-21 DIAGNOSIS — J9611 Chronic respiratory failure with hypoxia: Secondary | ICD-10-CM | POA: Diagnosis not present

## 2023-10-21 DIAGNOSIS — J441 Chronic obstructive pulmonary disease with (acute) exacerbation: Secondary | ICD-10-CM | POA: Diagnosis not present

## 2023-10-21 NOTE — Telephone Encounter (Signed)
-----   Message from [name] sent at 10/12/2023  3:27 PM EST ----- Mild blockage on the right and moderate on the right, just something to continue to monitor.  Dominga Ferry MD

## 2023-10-21 NOTE — Telephone Encounter (Signed)
Lesle Chris, LPN 16/09/9603  8:51 AM EST Back to Top    Notified daughter Rolly Salter), copy to pcp.

## 2023-10-28 ENCOUNTER — Encounter: Payer: Self-pay | Admitting: Internal Medicine

## 2023-10-28 ENCOUNTER — Ambulatory Visit: Payer: Medicare Other | Admitting: Internal Medicine

## 2023-10-28 VITALS — BP 105/54 | HR 79 | Ht 70.0 in | Wt 159.0 lb

## 2023-10-28 DIAGNOSIS — I10 Essential (primary) hypertension: Secondary | ICD-10-CM | POA: Insufficient documentation

## 2023-10-28 DIAGNOSIS — I480 Paroxysmal atrial fibrillation: Secondary | ICD-10-CM | POA: Insufficient documentation

## 2023-10-28 DIAGNOSIS — R0609 Other forms of dyspnea: Secondary | ICD-10-CM

## 2023-10-28 DIAGNOSIS — I509 Heart failure, unspecified: Secondary | ICD-10-CM | POA: Insufficient documentation

## 2023-10-28 DIAGNOSIS — I259 Chronic ischemic heart disease, unspecified: Secondary | ICD-10-CM | POA: Insufficient documentation

## 2023-10-28 MED ORDER — BUDESONIDE 0.25 MG/2ML IN SUSP: RESPIRATORY_TRACT | 12 refills | Status: DC

## 2023-10-28 MED ORDER — PREDNISONE 10 MG PO TABS: ORAL_TABLET | ORAL | 0 refills | Status: DC

## 2023-10-28 NOTE — Patient Instructions (Addendum)
Duoneb 4 x daily with Budesonide 0.25 mg on 1st  and 3rd dose  (bfast and supper)   Prednisone 10 mg 2 daily until better then one daily thereafter  Please remember to go to the lab department   for your tests - we will call you with the results when they are available.  If condition worsens I would recommend go to ER     [facility] to wear the bipap as much as you need to for comfort purposes and stop all smoking now if you can   Please schedule a follow up office visit in  2 weeks (next available NP) , sooner if needed  with all medications /inhalers/ solutions in hand so we can verify exactly what you are taking. This includes all medications from all doctors and over the counters

## 2023-10-28 NOTE — Progress Notes (Signed)
Dave [name], male    DOB: 1956-06-30    MRN: [id number]   Brief patient profile:  54  yowm  active smoker dry wall installer  referred to pulmonary clinic in [place]  10/28/2023 by Dr Wyline Mood for 02 dep resp failure / bipap x ? One year      History of Present Illness  10/28/2023  Pulmonary/ 1st office eval/ Jasir Rother / [name] / trelegy  Chief Complaint  Patient presents with   Establish Care   Shortness of Breath   Dyspnea:  75 ft best days slow/flat surfaces  Cough: min rattling  Sleep: bipap on couch arm plus  pillows  SABA use: 3-4 x per day neb  02: 3lpm     No obvious day to day or daytime pattern/variability or assoc excess/ purulent sputum or mucus plugs or hemoptysis or cp or chest tightness, subjective wheeze or overt sinus or hb symptoms.    Also denies any obvious fluctuation of symptoms with weather or environmental changes or other aggravating or alleviating factors except as outlined above   No unusual exposure hx or h/o childhood pna/ asthma or knowledge of premature birth.  Current Allergies, Complete Past Medical History, Past Surgical History, Family History, and Social History were reviewed in [employer] record.  ROS  The following are not active complaints unless bolded Hoarseness, sore throat, dysphagia, dental problems, itching, sneezing,  nasal congestion or discharge of excess mucus or purulent secretions, ear ache,   fever, chills, sweats, unintended wt loss or wt gain, classically pleuritic or exertional cp,  orthopnea pnd or arm/hand swelling  or leg swelling, presyncope, palpitations, abdominal pain, anorexia, nausea, vomiting, diarrhea  or change in bowel habits or change in bladder habits, change in stools or change in urine, dysuria, hematuria,  rash, arthralgias, visual complaints, headache, numbness, weakness or ataxia or problems with walking or coordination,  change in mood or  memory.            Outpatient  Medications Prior to Visit  Medication Sig Dispense Refill   apixaban (ELIQUIS) 5 MG TABS tablet Take 1 tablet by mouth twice daily 60 tablet 5   atorvastatin (LIPITOR) 80 MG tablet Take 1 tablet by mouth once daily 30 tablet 5   digoxin (LANOXIN) 0.125 MG tablet Take 125 mcg by mouth every morning.     FARXIGA 10 MG TABS tablet TAKE 1 TABLET BY MOUTH ONCE DAILY BEFORE BREAKFAST 90 tablet 0   furosemide (LASIX) 20 MG tablet TAKE ONE TABLET BY MOUTH ONCE DAILY AS NEEDED (FOR WEIGHT GAIN OF 3 POUNDS IN 24 HOURS OR 5 POUNDS IN ONE WEEK) 90 tablet 1   ipratropium-albuterol (DUONEB) 0.5-2.5 (3) MG/3ML SOLN SMARTSIG:1 Ampule(s) Via Nebulizer 1-4 Times Daily     metoprolol succinate (TOPROL-XL) 100 MG 24 hr tablet Take 100 mg by mouth daily. Take with or immediately following a meal.     nitroGLYCERIN (NITROSTAT) 0.4 MG SL tablet DISSOLVE ONE TABLET UNDER TONGUE EVERY 5 MINUTES UP TO 3 DOSES AS NEEDED FOR CHEST PAIN 25 tablet 2   nystatin (MYCOSTATIN) 100000 UNIT/ML suspension Take 5 mLs by mouth 4 (four) times daily.     PROAIR HFA 108 (90 Base) MCG/ACT inhaler Inhale 2 puffs into the lungs 4 (four) times daily as needed for shortness of breath.      sacubitril-valsartan (ENTRESTO) 49-51 MG Take 1 tablet by mouth 2 (two) times daily. 180 tablet 1   TRELEGY ELLIPTA 100-62.5-25 MCG/INH AEPB  Inhale 1 puff into the lungs daily.     Varenicline Tartrate, Starter, 0.5 MG X 11 & 1 MG X 42 TBPK See admin instructions.     amoxicillin (AMOXIL) 500 MG capsule Take 500 mg by mouth every 8 (eight) hours. (Patient not taking: Reported on 09/25/2023)     No facility-administered medications prior to visit.    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   [name] 12/16/2016   Anxiety    Anxiety disorder    Atrial fibrillation (HCC)    Chest pain    Chronic brain syndrome    Elevated blood pressure    Tobacco abuse       Objective:     BP (!) 105/54   Pulse 79    Ht 5\' 10"  (1.778 m)   Wt 159 lb (72.1 kg)   SpO2 97% Comment: 2LCONT  BMI 22.81 kg/m   SpO2: 97 % (2LCONT)  HEENT : Oropharynx  clar   Nasal turbinates nl    NECK :  without  apparent JVD/ palpable Nodes/TM    LUNGS: no acc muscle use,  Mild barrel  contour chest wall with bilateral  Distant bs s audible wheeze and  without cough on insp or exp maneuvers  and mild  Hyperresonant  to  percussion bilaterally     CV:  RRR  no s3 or murmur or increase in P2, and no edema   ABD:  soft and nontender with pos end  insp Hoover's  in the supine position.  No bruits or organomegaly appreciated   MS:  Nl gait/ ext warm without deformities Or obvious joint restrictions  calf tenderness, cyanosis or clubbing     SKIN: warm and dry without lesions    NEURO:  alert, approp, nl sensorium with  no motor or cerebellar deficits apparent.       I personally reviewed images and agree with radiology impression as follows:   Chest CT w/o contrast   09/10/23  Emphysematous disease is again noted throughout both lungs, with large bulla noted in right upper lobe which is unchanged. Minimal biapical scarring. Stable 5 mm nodule is noted laterally in right lower lobe best seen on image number 143 of series 3. Left upper lobe nodule noted on prior exam is not clearly visualized currently. There does appear to be some aspirated material in the left-sided the trachea and into the left mainstem bronchus.        Labs pending from day of OV and will be checked w/a Assessment   DOE (dyspnea on exertion) Active smoker with clinically severe emphysematous copd / 02 and bipap dep and unable to use trelegy effectively based on very poor Insp flows 10/28/2023 due to air trapping   I strongly doubt he is a candidate for lung transplant as also has worsening chf and still smoking so little to offer at this point other than a simplified regimen as follows  1) duoneb qid with bud 0.25 mg bid 2) no change  bipap/02 rx but during the day advised: Make sure you check your oxygen saturation  AT  your highest level of activity (not after you stop)   to be sure it stays over 90% and adjust  02 flow upward to maintain this level if needed but remember to turn it back to previous settings when you stop (to conserve your supply).  3) prednisone 20 mg until better then 10 mg daily until seen  4) if worse go to ER as nothing else offer in this clinic   Return in 2 weeks with all meds in hand using a trust but verify approach to confirm accurate Medication  Reconciliation The principal here is that until we are certain that the  patients are doing what we've asked, it makes no sense to ask them to do more.          Each maintenance medication was reviewed in detail including emphasizing most importantly the difference between maintenance and prns and under what circumstances the prns are to be triggered using an action plan format where appropriate.  Total time for H and P, chart review, counseling, reviewing dpi/hfa/neb/02 /pulse ox/ bipap device(s) and generating customized AVS unique to this office visit / same day charting  > 45 min new pt eval           Sandrea Hughs, MD 10/28/2023

## 2023-10-29 ENCOUNTER — Other Ambulatory Visit: Payer: Self-pay | Admitting: Internal Medicine

## 2023-10-30 ENCOUNTER — Encounter (HOSPITAL_COMMUNITY): Payer: Self-pay

## 2023-10-30 ENCOUNTER — Telehealth: Payer: Self-pay

## 2023-10-30 ENCOUNTER — Ambulatory Visit: Payer: Medicare Other | Attending: Nurse Practitioner | Admitting: Nurse Practitioner

## 2023-10-30 ENCOUNTER — Encounter: Payer: Self-pay | Admitting: Nurse Practitioner

## 2023-10-30 VITALS — BP 130/68 | HR 87 | Ht 69.0 in | Wt 164.4 lb

## 2023-10-30 DIAGNOSIS — J449 Chronic obstructive pulmonary disease, unspecified: Secondary | ICD-10-CM | POA: Insufficient documentation

## 2023-10-30 DIAGNOSIS — I252 Old myocardial infarction: Secondary | ICD-10-CM | POA: Diagnosis not present

## 2023-10-30 DIAGNOSIS — R059 Cough, unspecified: Secondary | ICD-10-CM | POA: Diagnosis not present

## 2023-10-30 DIAGNOSIS — Z20822 Contact with and (suspected) exposure to covid-19: Secondary | ICD-10-CM | POA: Diagnosis not present

## 2023-10-30 DIAGNOSIS — I499 Cardiac arrhythmia, unspecified: Secondary | ICD-10-CM | POA: Diagnosis not present

## 2023-10-30 DIAGNOSIS — I872 Venous insufficiency (chronic) (peripheral): Secondary | ICD-10-CM | POA: Diagnosis not present

## 2023-10-30 DIAGNOSIS — I447 Left bundle-branch block, unspecified: Secondary | ICD-10-CM | POA: Diagnosis not present

## 2023-10-30 DIAGNOSIS — F17210 Nicotine dependence, cigarettes, uncomplicated: Secondary | ICD-10-CM | POA: Diagnosis not present

## 2023-10-30 DIAGNOSIS — Z4682 Encounter for fitting and adjustment of non-vascular catheter: Secondary | ICD-10-CM | POA: Diagnosis not present

## 2023-10-30 DIAGNOSIS — I5022 Chronic systolic (congestive) heart failure: Secondary | ICD-10-CM | POA: Diagnosis not present

## 2023-10-30 DIAGNOSIS — R079 Chest pain, unspecified: Secondary | ICD-10-CM | POA: Diagnosis not present

## 2023-10-30 DIAGNOSIS — I509 Heart failure, unspecified: Secondary | ICD-10-CM | POA: Diagnosis not present

## 2023-10-30 DIAGNOSIS — I4891 Unspecified atrial fibrillation: Secondary | ICD-10-CM | POA: Diagnosis not present

## 2023-10-30 DIAGNOSIS — E785 Hyperlipidemia, unspecified: Secondary | ICD-10-CM | POA: Diagnosis not present

## 2023-10-30 DIAGNOSIS — R0602 Shortness of breath: Secondary | ICD-10-CM | POA: Insufficient documentation

## 2023-10-30 DIAGNOSIS — I700 Atherosclerosis of aorta: Secondary | ICD-10-CM | POA: Diagnosis not present

## 2023-10-30 DIAGNOSIS — I110 Hypertensive heart disease with heart failure: Secondary | ICD-10-CM | POA: Diagnosis not present

## 2023-10-30 DIAGNOSIS — J984 Other disorders of lung: Secondary | ICD-10-CM | POA: Diagnosis not present

## 2023-10-30 DIAGNOSIS — J439 Emphysema, unspecified: Secondary | ICD-10-CM | POA: Diagnosis not present

## 2023-10-30 DIAGNOSIS — I5042 Chronic combined systolic (congestive) and diastolic (congestive) heart failure: Secondary | ICD-10-CM

## 2023-10-30 DIAGNOSIS — J9622 Acute and chronic respiratory failure with hypercapnia: Secondary | ICD-10-CM | POA: Diagnosis not present

## 2023-10-30 DIAGNOSIS — J80 Acute respiratory distress syndrome: Secondary | ICD-10-CM | POA: Diagnosis not present

## 2023-10-30 DIAGNOSIS — I255 Ischemic cardiomyopathy: Secondary | ICD-10-CM | POA: Diagnosis not present

## 2023-10-30 DIAGNOSIS — R4182 Altered mental status, unspecified: Secondary | ICD-10-CM | POA: Diagnosis not present

## 2023-10-30 DIAGNOSIS — E8729 Other acidosis: Secondary | ICD-10-CM | POA: Diagnosis not present

## 2023-10-30 DIAGNOSIS — J441 Chronic obstructive pulmonary disease with (acute) exacerbation: Secondary | ICD-10-CM | POA: Diagnosis not present

## 2023-10-30 MED ORDER — METOPROLOL SUCCINATE ER 100 MG PO TB24: 100.0000 mg | ORAL_TABLET | Freq: Every day | ORAL | 1 refills | Status: DC

## 2023-10-30 NOTE — Patient Instructions (Signed)
Medication Instructions:  Your physician recommends that you continue on your current medications as directed. Please refer to the Current Medication list given to you today.  Labwork: None   Testing/Procedures: Your physician has requested that you have an echocardiogram. Echocardiography is a painless test that uses sound waves to create images of your heart. It provides your doctor with information about the size and shape of your heart and how well your heart's chambers and valves are working. This procedure takes approximately one hour. There are no restrictions for this procedure. Please do NOT wear cologne, perfume, aftershave, or lotions (deodorant is allowed). Please arrive 15 minutes prior to your appointment time.  Please note: We ask at that you not bring children with you during ultrasound (echo/ vascular) testing. Due to room size and safety concerns, children are not allowed in the ultrasound rooms during exams. Our front office staff cannot provide observation of children in our lobby area while testing is being conducted. An adult accompanying a patient to their appointment will only be allowed in the ultrasound room at the discretion of the ultrasound technician under special circumstances. We apologize for any inconvenience.  Follow-Up: Your physician recommends that you schedule a follow-up appointment in: 3-4 weeks   Any Other Special Instructions Will Be Listed Below (If Applicable).  If you need a refill on your cardiac medications before your next appointment, please call your pharmacy.

## 2023-10-30 NOTE — Progress Notes (Unsigned)
Cardiology Office Note:  .   Date:  10/30/2023 ID:  Dave [name], DOB October 12, 1956, MRN [id number] PCP: Ignatius Specking, MD  [facility] Providers Cardiologist:  [name], MD    History of Present Illness: .   Dave [name] is a 67 y.o. male with a PMH of CAD, s/p CABG x 3 in 2010, A-fib, PAD, chronic systolic CHF, ICM, carotid artery stenosis, hypertension, former smoker, COPD, who presents today for overdue 25-month follow-up appointment.  Last seen for preoperative cardiovascular risk assessment on October 23, 2022 with Bernadene Person, NP.  He was pending dental extraction at that time.  Was overall doing well at the time.  Last seen by Dr. [name] on June 18, 2022, had noted ongoing significant shortness of breath/dyspnea on exertion and was referred to pulmonary -missed his last appointment, had some soft BPs with some dizziness at times, Entresto was lowered to 49/51 mg twice daily, due to borderline high potassium levels, Aldactone was not started.  In the interim, he has had several ED visits/hospital admissions.  More recently, he has had a recent hospital admission at [facility] for COPD exacerbation.  CT scan revealed stable right lower lobe nodule.  Echocardiogram in hospital revealed EF reduced at 35 to 40% with repeat echo revealing worsening EF to 25 to 30%.  Recommended to follow-up outpatient cardiologist.  09/25/2023 - Today presents for outpatient follow-up.  He presents today with his family member.  He is doing well, and states he quit smoking 18 days ago.  Family member says normally he would be wearing oxygen for his office visit, but now he does not need to wear it, does wear it PRN.  They are requesting a referral to pulmonology as he is overdue for an office visit with his pulmonologist. Denies any chest pain, shortness of breath, palpitations, syncope, presyncope, dizziness, orthopnea, PND, swelling or significant weight changes, acute bleeding, or  claudication.  10/30/2023 - Patient presents today for follow-up.  Patient has returned to smoking.  Daughter states ever since he returned to smoking he has noticed more shortness of breath.  Patient reports smoking about half pack per day.  Patient says he is winded and is requesting to leave office to go use his nebulizer. Denies any chest pain,  palpitations, syncope, presyncope, dizziness, orthopnea, PND, swelling or significant weight changes, acute bleeding, or claudication.   ROS: Negative.  See HPI.  Studies Reviewed: Marland Kitchen    EKG: EKG is not ordered today.    Carotid duplex 10/2023: Summary:  Right Carotid: Velocities in the right ICA are consistent with a 1-39%  stenosis. Non-hemodynamically significant plaque <50% noted in the CCA. The ECA appears >50% stenosed.   Left Carotid: Velocities in the left ICA are consistent with a 60-79%  stenosis. Non-hemodynamically significant plaque <50% noted in the CCA. The ECA appears >50% stenosed.   Vertebrals:  Right vertebral artery demonstrates antegrade flow. Left  vertebral color flow-poorly visualized.  Subclavians: Bilateral subclavian artery flow was disturbed.   *See table(s) above for measurements and observations.  Suggest follow up study in 12 months. 2D echocardiogram 09/2023 [employer]): 1. The left ventricle is moderately dilated in size with normal wall thickness. 2. The left ventricular systolic function is severely decreased, LVEF is visually estimated at 25-30%. 3. There is mild mitral valve regurgitation. 4. The aortic valve is trileaflet with mildly thickened leaflets with normal excursion. 5. The left atrium is mildly dilated in size. 6. The right ventricle is  mildly dilated in size, with mildly reduced systolic function. 7. IVC size and inspiratory change suggest elevated right atrial pressure. (10-20 mmHg). Left Ventricle The left ventricle is moderately dilated in size with normal wall thickness. The left ventricular systolic  function is severely decreased, LVEF is visually estimated at 25-30%. Left ventricular diastolic function cannot be accurately assessed. Right Ventricle The right ventricle is mildly dilated in size, with mildly reduced systolic function. Left Atrium The left atrium is mildly dilated in size. Right Atrium The right atrium is normal in size. Aortic Valve The aortic valve is trileaflet with mildly thickened leaflets with normal excursion. There is trivial aortic regurgitation. There is no evidence of a significant transvalvular gradient. Mitral Valve The mitral valve leaflets are normal with normal leaflet mobility. There is mild mitral valve regurgitation. Tricuspid Valve The tricuspid valve leaflets are normal, with normal leaflet mobility. There is no significant tricuspid regurgitation. The pulmonary systolic pressure cannot be estimated due to insufficient TR signal. Pulmonic Valve Pulmonary valve is not well visualized. There is no significant pulmonic regurgitation. There is no evidence of a significant transvalvular gradient. Aorta The aorta is normal in size in the visualized segments. Inferior Vena Cava IVC size and inspiratory change suggest elevated right atrial pressure. (10-20 mmHg). Pericardium/Pleural There is no pericardial effusion.  Carotid duplex 08/2022: Summary:  Right Carotid: Velocities in the right ICA are consistent with a 1-39%  stenosis.                The ECA appears >50% stenosed.   Left Carotid: Velocities in the left ICA are consistent with a 40-59%  stenosis.               Non-hemodynamically significant plaque <50% noted in the  CCA. The                ECA appears >50% stenosed.   Vertebrals:  Bilateral vertebral arteries demonstrate antegrade flow.  Subclavians: Normal flow hemodynamics were seen in bilateral subclavian               arteries.   *See table(s) above for measurements and observations.  Suggest follow up study in 12 months.  Echo 11/2021: 1. Left  ventricular ejection fraction, by estimation, is 35 to 40%. The  left ventricle has moderately decreased function. The left ventricle  demonstrates regional wall motion abnormalities (see scoring  diagram/findings for description). The left  ventricular internal cavity size was mildly dilated. Left ventricular  diastolic parameters are consistent with Grade I diastolic dysfunction  (impaired relaxation).   2. Right ventricular systolic function is mildly reduced. The right  ventricular size is normal. There is normal pulmonary artery systolic  pressure. The estimated right ventricular systolic pressure is 27.2 mmHg.   3. Left atrial size was upper normal.   4. The mitral valve is grossly normal. Trivial mitral valve  regurgitation.   5. The aortic valve is tricuspid. Aortic valve regurgitation is not  visualized.   6. The inferior vena cava is normal in size with greater than 50%  respiratory variability, suggesting right atrial pressure of 3 mmHg.   Comparison(s): Prior images unable to be directly viewed.  Right/left heart cath 12/2016: Conclusions: 1.  Significant 2-vessel native coronary artery disease, including moderate diffuse proximal LAD disease and chronic total occlusion of mid LAD and ostial RCA.  70% proximal D1 stenosis is also present. 2.  Moderate, non-obstructive disease involving LCx.  Small branch of OM2 is chronically occluded. 3.  Widely patent LIMA->LAD. 4.  Patent SVG->rPDA with mild diffuse disease and discrete 95% stenosis at the distal anastomosis. 5.  Chronically occluded SVG->D1. 6.  Mildly elevated left and right heart filling pressures, as well as moderate pulmonary hypertension. 7.  Normal to mildly reduced cardiac output. 8.  Successful PCI to distal SVG->rPDA anastomosis using a Resolute Onxy 4.0 x 12 mm drug-eluting stent with 0% residual stenosis and TIMI-3 flow.   Recommendations: 1.  Admit for overnight observation following PCI with severe systolic  dysfunction. 2.  Dual antiplatelet therapy with aspirin and ticagrelor for at least 6-12 months, ideally indefinitely.  If patient experiences respiratory side effects from ticagrelor, he could be switched to clopidogrel. 3.  Avoid further IV hydration today; restart diuresis tomorrow if renal function allows. 4.  Aggressive medical therapy for severe ischemic cardiomyopathy.  If renal function stable, can consider starting ACEI tomorrow. 5.  Close outpatient follow-up with Dr. Wyline Mood.  Physical Exam:   VS:  BP 130/68 (BP Location: Left Arm)   Pulse 87   Ht 5\' 9"  (1.753 m)   Wt 164 lb 6.4 oz (74.6 kg)   SpO2 97%   BMI 24.28 kg/m    Wt Readings from Last 3 Encounters:  10/30/23 164 lb 6.4 oz (74.6 kg)  10/28/23 159 lb (72.1 kg)  09/25/23 156 lb 9.6 oz (71 kg)    GEN: Thin, 67 year old male in no acute distress NECK: No JVD; No carotid bruits CARDIAC: S1/S2, irregularly irregular rhythm, no murmurs, rubs, gallops RESPIRATORY:  Audible wheezing while entering exam room, diminished with expiratory wheezing to auscultation, increased work of breathing, tachypnea at rest  EXTREMITIES:  No edema; No deformity   ASSESSMENT AND PLAN: .    Shortness of breath Patient shows signs of respiratory distress and is tachypneic at rest with oxygen via nasal cannula on.  There is audible wheezing heard when walking into exam room before auscultating patient.  I discussed with patient my concern for his health and strongly recommended/advised presenting to the ED immediately.  Patient declined and stated he would like to go home immediately to use his nebulizer.  I discussed risk of delayed ED care and he verbalized understanding.  Patient states he will go to the emergency department if nebulizer does not help his symptoms.  No medication changes at this time.  Chronic systolic CHF, ICM Stage C, NYHA class IV symptoms.  EF previously found to be reduced to 25 to 30% at [facility] in October 2024.  Recommended ED care, however pt declined - see above. Will update limited Echo was patient's breathing is stable.  No medication changes at this time.  Low sodium diet, fluid restriction <2L, and daily weights encouraged. Educated to contact our office for weight gain of 2 lbs overnight or 5 lbs in one week.  COPD  Patient is showing signs of tachypnea at rest with audible wheezing on expiration. He has returned to smoking.  Strongly recommended/advise ED care immediately, however patient declined and requested to go home immediately to use his nebulizer.  Discussed risk of delayed ED care.  He understand if nebulizer does not improve his symptoms, to report to the ED immediately.  He verbalized understanding. Smoking cessation encouraged and discussed.    Dispo: Will provide refill per his request. Follow-up with me/APP in 3-4 weeks or sooner anything changes.  Signed, Sharlene Dory, NP

## 2023-10-30 NOTE — Telephone Encounter (Signed)
*  Pulm  Pharmacy Patient Advocate Encounter   Received notification from CoverMyMeds that prior authorization for Budesonide 0.25MG /2ML suspension  is required/requested.   Insurance verification completed.   The patient is insured through [facility] .   Per test claim: PA required; PA submitted to above mentioned insurance via CoverMyMeds Key/confirmation #/EOC [id number] Status is pending   *may need to be submitted to Medicare Part B

## 2023-10-31 DIAGNOSIS — A419 Sepsis, unspecified organism: Secondary | ICD-10-CM | POA: Diagnosis not present

## 2023-10-31 DIAGNOSIS — F17200 Nicotine dependence, unspecified, uncomplicated: Secondary | ICD-10-CM | POA: Diagnosis not present

## 2023-10-31 DIAGNOSIS — I959 Hypotension, unspecified: Secondary | ICD-10-CM | POA: Diagnosis not present

## 2023-10-31 DIAGNOSIS — I5023 Acute on chronic systolic (congestive) heart failure: Secondary | ICD-10-CM | POA: Diagnosis not present

## 2023-10-31 DIAGNOSIS — E785 Hyperlipidemia, unspecified: Secondary | ICD-10-CM | POA: Diagnosis not present

## 2023-10-31 DIAGNOSIS — J9622 Acute and chronic respiratory failure with hypercapnia: Secondary | ICD-10-CM | POA: Diagnosis not present

## 2023-10-31 DIAGNOSIS — R6521 Severe sepsis with septic shock: Secondary | ICD-10-CM | POA: Diagnosis not present

## 2023-10-31 DIAGNOSIS — J439 Emphysema, unspecified: Secondary | ICD-10-CM | POA: Diagnosis not present

## 2023-10-31 DIAGNOSIS — T17890A Other foreign object in other parts of respiratory tract causing asphyxiation, initial encounter: Secondary | ICD-10-CM | POA: Diagnosis not present

## 2023-10-31 DIAGNOSIS — J90 Pleural effusion, not elsewhere classified: Secondary | ICD-10-CM | POA: Diagnosis not present

## 2023-10-31 DIAGNOSIS — J9602 Acute respiratory failure with hypercapnia: Secondary | ICD-10-CM | POA: Diagnosis not present

## 2023-10-31 DIAGNOSIS — I509 Heart failure, unspecified: Secondary | ICD-10-CM | POA: Diagnosis not present

## 2023-10-31 DIAGNOSIS — Z4682 Encounter for fitting and adjustment of non-vascular catheter: Secondary | ICD-10-CM | POA: Diagnosis not present

## 2023-10-31 DIAGNOSIS — R570 Cardiogenic shock: Secondary | ICD-10-CM | POA: Diagnosis not present

## 2023-10-31 DIAGNOSIS — F17210 Nicotine dependence, cigarettes, uncomplicated: Secondary | ICD-10-CM | POA: Diagnosis not present

## 2023-10-31 DIAGNOSIS — I739 Peripheral vascular disease, unspecified: Secondary | ICD-10-CM | POA: Diagnosis not present

## 2023-10-31 DIAGNOSIS — J690 Pneumonitis due to inhalation of food and vomit: Secondary | ICD-10-CM | POA: Diagnosis not present

## 2023-10-31 DIAGNOSIS — I2510 Atherosclerotic heart disease of native coronary artery without angina pectoris: Secondary | ICD-10-CM | POA: Insufficient documentation

## 2023-10-31 DIAGNOSIS — R918 Other nonspecific abnormal finding of lung field: Secondary | ICD-10-CM | POA: Diagnosis not present

## 2023-10-31 DIAGNOSIS — I110 Hypertensive heart disease with heart failure: Secondary | ICD-10-CM | POA: Diagnosis not present

## 2023-10-31 DIAGNOSIS — I255 Ischemic cardiomyopathy: Secondary | ICD-10-CM | POA: Diagnosis not present

## 2023-10-31 DIAGNOSIS — Z720 Tobacco use: Secondary | ICD-10-CM | POA: Diagnosis not present

## 2023-10-31 DIAGNOSIS — T17410A Gastric contents in trachea causing asphyxiation, initial encounter: Secondary | ICD-10-CM | POA: Diagnosis not present

## 2023-10-31 DIAGNOSIS — Z7952 Long term (current) use of systemic steroids: Secondary | ICD-10-CM | POA: Diagnosis not present

## 2023-10-31 DIAGNOSIS — I952 Hypotension due to drugs: Secondary | ICD-10-CM | POA: Diagnosis not present

## 2023-10-31 DIAGNOSIS — I252 Old myocardial infarction: Secondary | ICD-10-CM | POA: Diagnosis not present

## 2023-10-31 DIAGNOSIS — I480 Paroxysmal atrial fibrillation: Secondary | ICD-10-CM | POA: Diagnosis not present

## 2023-10-31 DIAGNOSIS — I10 Essential (primary) hypertension: Secondary | ICD-10-CM | POA: Diagnosis not present

## 2023-10-31 DIAGNOSIS — Z7901 Long term (current) use of anticoagulants: Secondary | ICD-10-CM | POA: Diagnosis not present

## 2023-10-31 DIAGNOSIS — I493 Ventricular premature depolarization: Secondary | ICD-10-CM | POA: Diagnosis not present

## 2023-10-31 DIAGNOSIS — E872 Acidosis, unspecified: Secondary | ICD-10-CM | POA: Diagnosis not present

## 2023-10-31 DIAGNOSIS — Z20822 Contact with and (suspected) exposure to covid-19: Secondary | ICD-10-CM | POA: Diagnosis not present

## 2023-10-31 DIAGNOSIS — Z452 Encounter for adjustment and management of vascular access device: Secondary | ICD-10-CM | POA: Diagnosis not present

## 2023-10-31 DIAGNOSIS — E8729 Other acidosis: Secondary | ICD-10-CM | POA: Diagnosis not present

## 2023-10-31 DIAGNOSIS — J441 Chronic obstructive pulmonary disease with (acute) exacerbation: Secondary | ICD-10-CM | POA: Diagnosis not present

## 2023-10-31 DIAGNOSIS — I214 Non-ST elevation (NSTEMI) myocardial infarction: Secondary | ICD-10-CM | POA: Diagnosis not present

## 2023-10-31 DIAGNOSIS — I5020 Unspecified systolic (congestive) heart failure: Secondary | ICD-10-CM | POA: Diagnosis not present

## 2023-10-31 DIAGNOSIS — Z951 Presence of aortocoronary bypass graft: Secondary | ICD-10-CM | POA: Diagnosis not present

## 2023-10-31 DIAGNOSIS — Z9911 Dependence on respirator [ventilator] status: Secondary | ICD-10-CM | POA: Diagnosis not present

## 2023-10-31 DIAGNOSIS — I4510 Unspecified right bundle-branch block: Secondary | ICD-10-CM | POA: Diagnosis not present

## 2023-10-31 DIAGNOSIS — I5A Non-ischemic myocardial injury (non-traumatic): Secondary | ICD-10-CM | POA: Diagnosis not present

## 2023-10-31 DIAGNOSIS — J9621 Acute and chronic respiratory failure with hypoxia: Secondary | ICD-10-CM | POA: Diagnosis not present

## 2023-11-01 ENCOUNTER — Encounter: Payer: Self-pay | Admitting: Internal Medicine

## 2023-11-01 LAB — CBC WITH DIFFERENTIAL/PLATELET
Basophils Absolute: 0 10*3/uL (ref 0.0–0.2)
Basos: 0 %
EOS (ABSOLUTE): 0.1 10*3/uL (ref 0.0–0.4)
Eos: 1 %
Hematocrit: 41.7 % (ref 37.5–51.0)
Hemoglobin: 13.5 g/dL (ref 13.0–17.7)
Immature Grans (Abs): 0.1 10*3/uL (ref 0.0–0.1)
Immature Granulocytes: 1 %
Lymphocytes Absolute: 1.5 10*3/uL (ref 0.7–3.1)
Lymphs: 14 %
MCH: 30 pg (ref 26.6–33.0)
MCHC: 32.4 g/dL (ref 31.5–35.7)
MCV: 93 fL (ref 79–97)
Monocytes Absolute: 0.8 10*3/uL (ref 0.1–0.9)
Monocytes: 8 %
Neutrophils Absolute: 8.2 10*3/uL — ABNORMAL HIGH (ref 1.4–7.0)
Neutrophils: 76 %
Platelets: 271 10*3/uL (ref 150–450)
RBC: 4.5 x10E6/uL (ref 4.14–5.80)
RDW: 13 % (ref 11.6–15.4)
WBC: 10.7 10*3/uL (ref 3.4–10.8)

## 2023-11-01 LAB — BASIC METABOLIC PANEL
BUN/Creatinine Ratio: 23 (ref 10–24)
BUN: 18 mg/dL (ref 8–27)
CO2: 32 mmol/L — ABNORMAL HIGH (ref 20–29)
Calcium: 9.6 mg/dL (ref 8.6–10.2)
Chloride: 99 mmol/L (ref 96–106)
Creatinine, Ser: 0.8 mg/dL (ref 0.76–1.27)
Glucose: 105 mg/dL — ABNORMAL HIGH (ref 70–99)
Potassium: 4.2 mmol/L (ref 3.5–5.2)
Sodium: 144 mmol/L (ref 134–144)
eGFR: 98 mL/min/{1.73_m2} (ref >59)

## 2023-11-01 LAB — ALPHA-1-ANTITRYPSIN PHENOTYP: A-1 Antitrypsin: 140 mg/dL (ref 101–187)

## 2023-11-01 LAB — BRAIN NATRIURETIC PEPTIDE: BNP: 198.9 pg/mL — ABNORMAL HIGH (ref 0.0–100.0)

## 2023-11-01 LAB — TSH: TSH: 1.42 u[IU]/mL (ref 0.450–4.500)

## 2023-11-01 NOTE — Assessment & Plan Note (Addendum)
Active smoker with clinically severe emphysematous copd / 02 and bipap dep and unable to use trelegy effectively based on very poor Insp flows 10/28/2023 due to air trapping   I strongly doubt he is a candidate for lung transplant as also has worsening chf and still smoking so little to offer at this point other than a simplified regimen as follows  1) duoneb qid with bud 0.25 mg bid 2) no change bipap/02 rx but during the day advised: Make sure you check your oxygen saturation  AT  your highest level of activity (not after you stop)   to be sure it stays over 90% and adjust  02 flow upward to maintain this level if needed but remember to turn it back to previous settings when you stop (to conserve your supply).  3) prednisone 20 mg until better then 10 mg daily until seen  4) if worse go to ER as nothing else offer in this clinic   Return in 2 weeks with all meds in hand using a trust but verify approach to confirm accurate Medication  Reconciliation The principal here is that until we are certain that the  patients are doing what we've asked, it makes no sense to ask them to do more.          Each maintenance medication was reviewed in detail including emphasizing most importantly the difference between maintenance and prns and under what circumstances the prns are to be triggered using an action plan format where appropriate.  Total time for H and P, chart review, counseling, reviewing dpi/hfa/neb/02 /pulse ox/ bipap device(s) and generating customized AVS unique to this office visit / same day charting  > 45 min complex new pt eval

## 2023-11-04 ENCOUNTER — Other Ambulatory Visit (HOSPITAL_COMMUNITY): Payer: Self-pay

## 2023-11-04 MED ORDER — BUDESONIDE 0.25 MG/2ML IN SUSP: RESPIRATORY_TRACT | 12 refills | Status: DC

## 2023-11-04 NOTE — Telephone Encounter (Signed)
Placed new dx code for Pulmicort, can we get a PA on this

## 2023-11-04 NOTE — Telephone Encounter (Signed)
[facility] calling regarding PA for this same medication. They say that the diagnosis code is not acceptable to put this thru as Medicare Part B. Please FU w/PT and advise Triage. Can he go thru [place] using PA once diag code changed? Must he go thru Cover MY Meds?TY

## 2023-11-04 NOTE — Telephone Encounter (Signed)
Unable to prescribe Pulmicort neb with dx code for sob, did this pt have anything else maybe, or can we do a alternative ? Dr.Wert please advise

## 2023-11-04 NOTE — Telephone Encounter (Signed)
Try dx Chronic asthmatic bronchitis

## 2023-11-04 NOTE — Telephone Encounter (Signed)
Test claim shows this needs to be processed through patient's part B coverage. Patient pharmacy will be responsible for processing.

## 2023-11-11 ENCOUNTER — Encounter: Payer: Self-pay | Admitting: Internal Medicine

## 2023-11-11 ENCOUNTER — Ambulatory Visit: Payer: Medicare Other | Admitting: Internal Medicine

## 2023-11-11 VITALS — BP 96/63 | HR 74 | Ht 69.0 in | Wt 154.0 lb

## 2023-11-11 DIAGNOSIS — J9612 Chronic respiratory failure with hypercapnia: Secondary | ICD-10-CM | POA: Diagnosis not present

## 2023-11-11 DIAGNOSIS — J9611 Chronic respiratory failure with hypoxia: Secondary | ICD-10-CM

## 2023-11-11 DIAGNOSIS — R0609 Other forms of dyspnea: Secondary | ICD-10-CM

## 2023-11-11 NOTE — Assessment & Plan Note (Signed)
Active smoker/MM with clinically severe emphysematous copd / 02 and bipap dep and unable to use trelegy effectively based on very poor Insp flows 10/28/2023 due to air trapping  - Alpha one AT [employer]  MM level 140   Classic pink puffer appearance c/w severe emphysematous copd and unable to use devices due to air trapping with low IRV so duoneb with budesonide reasonable option and prednisone as backup = 20 mg until better then taper off

## 2023-11-11 NOTE — Patient Instructions (Addendum)
Change lopressor 100 mg to where you take a half twice daily instead of a whole pill once a day  Duoneb 4 x daily with Budesonide 0.25 mg on 1st  and 3rd dose  (bfast and supper)   If breathing getting worse >>> Prednisone 10 mg 2 daily until better then one daily    Ok to wear the bipap as much as you need to for comfort purposes and stop all smoking now if you can    Make sure you check your oxygen saturation at your highest level of activity(NOT after you stop)  to be sure it stays over 90% and keep track of it at least once a week, more often if breathing getting worse, and let me know if losing ground. (Collect the dots to connect the dots approach)     Congratulations on not smoking    Please schedule a follow up office visit in 6 weeks, call sooner if needed with all medications /inhalers/ solutions in hand so we can verify exactly what you are taking. This includes all medications from all doctors and over the counters

## 2023-11-11 NOTE — Assessment & Plan Note (Signed)
HC03  11/04/23    35  - 11/11/2023   Walked on 2lpm cont  x  1  lap(s) =  approx 150  ft  @ moderately fast pace, stopped due to sob /weak legs/ fatigue with lowest 02 sats 97% and pulse started at 46 and ended at 80    Strongly advised:  1) pace slower and consider rehab  2) change lopressor from 100 q d to 50 mg bid and f/u with Dr Wyline Mood as planned  3) Make sure you check your oxygen saturation at your highest level of activity(NOT after you stop)  to be sure it stays over 90% and keep track of it at least once a week, more often if breathing getting worse, and let me know if losing ground. (Collect the dots to connect the dots approach)     F/u in 6 weeks with all meds in hand using a trust but verify approach to confirm accurate Medication  Reconciliation The principal here is that until we are certain that the  patients are doing what we've asked, it makes no sense to ask them to do more.   Each maintenance medication was reviewed in detail including emphasizing most importantly the difference between maintenance and prns and under what circumstances the prns are to be triggered using an action plan format where appropriate.  Total time for H and P, chart review, counseling, reviewing hfa/dpi/ neb/ 02 pulse ox  device(s) , directly observing portions of ambulatory 02 saturation study/ and generating customized AVS unique to this office visit / same day charting  > 40 min post hosp f/u

## 2023-11-11 NOTE — Progress Notes (Signed)
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 One year      History of Present Illness  10/28/2023  Pulmonary/ 1st office eval/ Ca[facility] / [name] / trelegy  Chief Complaint  Patient presents with   Establish Care   Shortness of Breath   Dyspnea:  75 ft best days slow/flat surfaces  Cough: min rattling  Sleep: bipap on couch arm plus  pillows  SABA use: 3-4 x per day neb  02: 3lpm  Rec Duoneb 4 x daily with Budesonide 0.25 mg on 1st  and 3rd dose  (bfast and supper)  Prednisone 10 mg 2 daily until better then one daily thereafter Please remember to go to the lab department   for your tests - we will call you with the results when they are available. Ok to wear the bipap as much as you need to for comfort purposes and stop all smoking now if you can  Please schedule a follow up office visit in  2 weeks (next available NP) , sooner if needed  with all medications /inhalers/ solutions in hand so we can verify exactly what you are taking. This includes all medications from all doctors and over the counters   dmit Date: 10/31/2023 Discharge Date: 11/04/2023  Discharge Diagnoses:  Acute exacerbation of chronic obstructive pulmonary disease (COPD) (CMS/HHS-HCC) PAD (peripheral artery disease) (CMS-HCC) Paroxysmal atrial fibrillation (CMS/HHS-HCC) Tobacco abuse disorder Hypertension HFrEF (heart failure with reduced ejection fraction) (CMS/HHS-HCC) Coronary artery disease involving native heart Hypotension due to drugs On mechanically assisted ventilation (CMS/HHS-HCC)  Acute on Chronic Respiratory Failure due to COPD flare, CHF exacerbation and possible aspiration pneumonia.   He was intubated, treated with steroids and antibiotics with cefepime/azithromycin and diuresed with  IV lasix with improvement to baseline.  Changes Made (with rationale):  Nicotine replacement patch and gum Protonix 40 mg daily (for chronic steroid GI ppx) Aspirin 81 mg daily  To-Do List (incidental findings, follow-up studies, etc.):  Anticipatory Guidance for Outpatient Care:  Referred to tobacco cessation program PT/OT recommended Home health but he declined   Results Pending at Discharge:   Please see phone numbers at end of this summary for lab contact information.   START taking these medications  Details  aspirin 81 MG chewable tablet Take 1 tablet (81 mg total) by mouth once daily Qty: 30 tablet, Refills: 0   budesonide (PULMICORT) 0.5 mg/2 mL nebulizer solution Take 2 mLs (0.5 mg total) by nebulization 2 (two) times daily Qty: 60 mL, Refills: 0   nicotine (NICODERM CQ) 7 mg/24 hr patch Place 1 patch onto the skin once daily for 42 days Qty: 14 patch, Refills: 2   nicotine polacrilex (NICORETTE) 2 mg gum Take 1 each (2 mg total) by mouth as needed for Smoking cessation for up to 90 days Chew the gum until it tingles and then park it between your cheek and gum. Repeat. Qty: 100 each, Refills: 0   pantoprazole (PROTONIX) 40 MG DR tablet Take 1 tablet (40 mg total) by mouth once daily Qty: 30 tablet, Refills: 0     CONTINUE these medications which have NOT CHANGED  Details  albuterol MDI, PROVENTIL, VENTOLIN, PROAIR, HFA 90 mcg/actuation inhaler Inhale 2 inhalations into the lungs  apixaban (ELIQUIS) 5 mg tablet Take 5 mg by mouth every 12 (twelve) hours   atorvastatin (LIPITOR) 80 MG tablet Take 80 mg by mouth once daily   digoxin (LANOXIN) 0.125 MG tablet Take 0.125 mg by mouth once daily   FARXIGA 10 mg tablet Take 10 mg by mouth once daily   ipratropium-albuteroL (DUO-NEB) nebulizer solution Take 3 mLs by nebulization 4 (four) times daily   metoprolol SUCCinate (TOPROL-XL) 100 MG XL tablet Take 100 mg by mouth once daily   predniSONE (DELTASONE) 10 MG  tablet Take 10 mg by mouth once daily   sacubitriL-valsartan (ENTRESTO) 49-51 mg tablet Take 1 tablet by mouth every 12 (twelve) hours   Brief History of Present Illness:  '67 y.o. male with significant for HFrEF, COPD, HTN, MI, CABG x 3 in 2010; PAD, carotid artery stenosis, A fib on eliquis, smoker who presented to an outside hospital by EMS on 11-22 with severe respiratory distress. Initial ABG 7.08/>103/248. Intubated by ER physician. CVC placed. CTA chest with contrast negative for PE. Aspirated material in the trachea. Advanced emphysema with bullous change in the right mid lung. Stable 5 mm nodule in the right lower lobe (stable since 01/20/22). ECG without evidence of STEMI. CT brain without evidence of acute intracranial process. Follow up ABG post intubation 7.40/51.8/422/32. He was transferred to [facility] ICU on 11/23 for higher level of care.   Since being in ICU 11/23-11/26 he was extubated on 11/24, off pressors on 11/24 and passed swallow eval on 11/25. He received Azithromycin x3d and Cefepime for x5d for COPD exacerbation and received high dose steroids as well. He was diuresed with lasix IV. PT/OT evaluated and recommended HH.   _____________________  Hospital Course by Problem:  Acute on chronic hypoxic/hypercapnic respiratory failure Acute COPD exacerbation Aspiration  Intubated at OSH ED for CO2>100, stomach contents noted in trachea; weaned and extubated on 11/24 to HFNC/ BiPAP qHS. Followed by Pulmonary at [facility] follows with Dr. Sherene Sires; baseline PCO2 is around 58-60s with nocturnal PAP therapy. OP Pulm notes he was unable to use trelegy effecively due to very poor inspiratory flows. At baseline wears 2-3L during the day at BiPAP QHS.  He suspects was triggered by recent return to smoking and not wearing O2 while ambulating at home.  - He was treated with Azithromycin x3d and cefepime x5d.  - Treated with 5d course of high dose steroids (last day 11/26) then  resumed on home dose prednisone 10mg  daily and remains stable - started on PPI with his chronic steroids - Continue bronchodilators (home: albuterol, duoneb); prescribed budesonide BID as he was supposed to be on this but was not reported as such - PCP f/u appt scheduled for 11/10/2023 - PT/OT consulted; rec [facility]  Tobacco abuse Counseled on cessation. Shares he quit for 28days following previous admission - feels supported in trying again - Nicotine patch and gum prn - previously was given chantix at last hospital d/c but hasn't started yet and would prefer to do nicotine replacement for now  - referred to tobacco cessation program  HFrEF (EF 27%) paroxysmal Afib Tachycardia + ectopy  CAD s/p CABG 3v Follows with [facility] in [place]. Dry weight appears ~70-72kg. CHADSVASc score of 4 (Congestive heart failure or low EF (1), Hypertension (1), Vascular disease (PVD, MI, aortic plaque), and Age 17-74 (1)) - Admission TTE (11/23) with severe LV dysfunction (EF 25%), preserved RV function, no significant valve disease  - continue Eliquis -  Continue home: metop succ 100mg  daily - Continue home Digoxin - not on diuretics at home, was given spot dosed 20 mg Lasix IV while in ICU now euvolemic Weight at time of discharge is 150 lbs  PAD carotid artery stenosis HLD Last carotid US 09/30/23-right ICA consistent 1-39% stenosis; ECA > 50% stenosed. Left carotid 60-79% stenosis, ECA > 50% stenosed  - Continue home statin and Zetia  Dysphagia evaluation post intubation Aspiration (noted to have aspirated material in trachea s/p intubation) - SLP consulted for swallow study prior to diet - passed for regular diet/thins  Abnormal troponin: He had an abnormal high-sensitivity troponin panel. Based on my review of all clinical and laboratory information, the abnormal troponin most likely reflects myocardial injury secondary to another medical condition (ICD-10 code I5A).          11/11/2023  f/u ov/[place] office/Lakshya Mcgillicuddy re: clinically severe copd/bipap dep    last used duoneb/ pulmicort around about 5 h prior/ hfa no/ last prednisone 10 mg  one day prior to OV   Chief Complaint  Patient presents with   Shortness of Breath  Dyspnea:  stays home/ out of breath walking to kitchen whether wearing 02 or not  Cough: none  Sleeping: props up on couch arm /pillows bipap wakes up due to bad dreams  SABA use: as above 02: 3lpm hs  and 3lpm daytime/    No obvious day to day or daytime variability or assoc excess/ purulent sputum or mucus plugs or hemoptysis or cp or chest tightness, subjective wheeze or overt sinus or hb symptoms.    Also denies any obvious fluctuation of symptoms with weather or environmental changes or other aggravating or alleviating factors except as outlined above   No unusual exposure hx or h/o childhood pna/ asthma or knowledge of premature birth.  Current Allergies, Complete Past Medical History, Past Surgical History, Family History, and Social History were reviewed in [employer] record.  ROS  The following are not active complaints unless bolded Hoarseness, sore throat, dysphagia, dental problems, itching, sneezing,  nasal congestion or discharge of excess mucus or purulent secretions, ear ache,   fever, chills, sweats, unintended wt loss or wt gain, classically pleuritic or exertional cp,  orthopnea pnd or arm/hand swelling  or leg swelling, presyncope, palpitations, abdominal pain, anorexia, nausea, vomiting, diarrhea  or change in bowel habits or change in bladder habits, change in stools or change in urine, dysuria, hematuria,  rash, arthralgias, visual complaints, headache, numbness, weakness or ataxia or problems with walking or coordination,  change in mood or  memory.        Current Meds  Medication Sig   apixaban (ELIQUIS) 5 MG TABS tablet Take 1 tablet by mouth twice daily   atorvastatin (LIPITOR) 80 MG  tablet Take 1 tablet by mouth once daily   budesonide (PULMICORT) 0.25 MG/2ML nebulizer solution USE 1 VIAL IN NEBULIZER TWICE DAILY   digoxin (LANOXIN) 0.125 MG tablet Take 125 mcg by mouth every morning.   FARXIGA 10 MG TABS tablet TAKE 1 TABLET BY MOUTH ONCE DAILY BEFORE BREAKFAST   furosemide (LASIX) 20 MG tablet TAKE ONE TABLET BY MOUTH ONCE DAILY AS NEEDED (FOR WEIGHT GAIN OF 3 POUNDS IN 24 HOURS OR 5 POUNDS IN ONE WEEK)   ipratropium-albuterol (DUONEB) 0.5-2.5 (3) MG/3ML SOLN SMARTSIG:1 Ampule(s) Via Nebulizer 1-4 Times Daily   metoprolol succinate (TOPROL-XL) 100 MG 24 hr tablet Take 1 tablet (100 mg total) by mouth daily. Take with or immediately  following a meal.   nitroGLYCERIN (NITROSTAT) 0.4 MG SL tablet DISSOLVE ONE TABLET UNDER TONGUE EVERY 5 MINUTES UP TO 3 DOSES AS NEEDED FOR CHEST PAIN   nystatin (MYCOSTATIN) 100000 UNIT/ML suspension Take 5 mLs by mouth 4 (four) times daily.   predniSONE (DELTASONE) 10 MG tablet 2 daily until better then 1 daily   PROAIR HFA 108 (90 Base) MCG/ACT inhaler Inhale 2 puffs into the lungs 4 (four) times daily as needed for shortness of breath.    sacubitril-valsartan (ENTRESTO) 49-51 MG Take 1 tablet by mouth 2 (two) times daily.   TRELEGY ELLIPTA 100-62.5-25 MCG/INH AEPB Inhale 1 puff into the lungs daily.   Varenicline Tartrate, Starter, 0.5 MG X 11 & 1 MG X 42 TBPK See admin instructions.            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   [name] 12/16/2016   Anxiety    Anxiety disorder    Atrial fibrillation (HCC)    Chest pain    Chronic brain syndrome    Elevated blood pressure    Tobacco abuse       Objective:   Wts   11/11/2023       154   10/30/23 164 lb 6.4 oz (74.6 kg)  10/28/23 159 lb (72.1 kg)  09/25/23 156 lb 9.6 oz (71 kg)      Vital signs reviewed  11/11/2023  - Note at rest 02 sats  98% on 3lpm cont    General appearance:    anxious wm tachypneic at rest     HEENT  : Oropharynx  clear      NECK :  without  apparent JVD/ palpable Nodes/TM    LUNGS: no acc muscle use,  Mild barrel  contour chest wall with bilateral  Distant bs s audible wheeze and  without cough on insp or exp maneuvers  and mild  Hyperresonant  to  percussion bilaterally     CV:  RRR  no s3 or murmur or increase in P2, and no edema   ABD:  soft and nontender with pos end  insp Hoover's  in the supine position.  No bruits or organomegaly appreciated   MS:  Nl gait/ ext warm without deformities Or obvious joint restrictions  calf tenderness, cyanosis or clubbing     SKIN: warm and dry without lesions    NEURO:  alert, approp, nl sensorium with  no motor or cerebellar deficits apparent.        Assessment

## 2023-11-16 ENCOUNTER — Telehealth: Payer: Self-pay | Admitting: *Deleted

## 2023-11-16 ENCOUNTER — Other Ambulatory Visit: Payer: Self-pay | Admitting: Cardiology

## 2023-11-16 NOTE — Telephone Encounter (Signed)
Called [facility] and they re-ran thru Part B there is $0 copay.  LVM for patient that med will be ready for pick-up after 2 pm.

## 2023-11-16 NOTE — Patient Outreach (Signed)
  Care Coordination   11/16/2023 Name: Dave [name] MRN: [id number] DOB: 1956/06/13   Care Coordination Outreach Attempts:  An unsuccessful telephone outreach was attempted today to offer the patient information about available care coordination services.  Follow Up Plan:  Additional outreach attempts will be made to offer the patient care coordination information and services.   Encounter Outcome:  No Answer   Care Coordination Interventions:  No, not indicated    Demetrios Loll, RN, BSN Care Management Coordinator [facility]  [facility] Direct Dial: [phone] Main #: [phone]

## 2023-12-04 ENCOUNTER — Ambulatory Visit: Payer: Medicare Other | Attending: Nurse Practitioner | Admitting: Nurse Practitioner

## 2023-12-15 ENCOUNTER — Other Ambulatory Visit: Payer: Self-pay | Admitting: *Deleted

## 2023-12-15 MED ORDER — ENTRESTO 49-51 MG PO TABS: 1.0000 | ORAL_TABLET | Freq: Two times a day (BID) | ORAL | 1 refills | Status: DC

## 2023-12-23 ENCOUNTER — Encounter: Payer: Self-pay | Admitting: Internal Medicine

## 2023-12-23 ENCOUNTER — Ambulatory Visit: Payer: Medicare Other | Admitting: Internal Medicine

## 2023-12-23 NOTE — Progress Notes (Deleted)
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 One year      History of Present Illness  10/28/2023  Pulmonary/ 1st office eval/ Dave [name] / Dave [name] Office / trelegy  Chief Complaint  Patient presents with   Establish Care   Shortness of Breath   Dyspnea:  75 ft best days slow/flat surfaces  Cough: min rattling  Sleep: bipap on couch arm plus  pillows  SABA use: 3-4 x per day neb  02: 3lpm  Rec Duoneb 4 x daily with Budesonide 0.25 mg on 1st  and 3rd dose  (bfast and supper)  Prednisone 10 mg 2 daily until better then one daily thereafter Please remember to go to the lab department   for your tests - we will call you with the results when they are available. Ok to wear the bipap as much as you need to for comfort purposes and stop all smoking now if you can  Please schedule a follow up office visit in  2 weeks (next available NP) , sooner if needed  with all medications /inhalers/ solutions in hand so we can verify exactly what you are taking. This includes all medications from all doctors and over the counters   dmit Date: 10/31/2023 Discharge Date: 11/04/2023  Discharge Diagnoses:  Acute exacerbation of chronic obstructive pulmonary disease (COPD) (CMS/HHS-HCC) PAD (peripheral artery disease) (CMS-HCC) Paroxysmal atrial fibrillation (CMS/HHS-HCC) Tobacco abuse disorder Hypertension HFrEF (heart failure with reduced ejection fraction) (CMS/HHS-HCC) Coronary artery disease involving native heart Hypotension due to drugs On mechanically assisted ventilation (CMS/HHS-HCC)  Acute on Chronic Respiratory Failure due to COPD flare, CHF exacerbation and possible aspiration pneumonia.   He was intubated, treated with steroids and antibiotics with cefepime/azithromycin and diuresed with  IV lasix with improvement to baseline.  Changes Made (with rationale):  Nicotine replacement patch and gum Protonix 40 mg daily (for chronic steroid GI ppx) Aspirin 81 mg daily  To-Do List (incidental findings, follow-up studies, etc.):  Anticipatory Guidance for Outpatient Care:  Referred to tobacco cessation program PT/OT recommended Home health but he declined   Results Pending at Discharge:   Please see phone numbers at end of this summary for lab contact information.   START taking these medications  Details  aspirin 81 MG chewable tablet Take 1 tablet (81 mg total) by mouth once daily Qty: 30 tablet, Refills: 0   budesonide (PULMICORT) 0.5 mg/2 mL nebulizer solution Take 2 mLs (0.5 mg total) by nebulization 2 (two) times daily Qty: 60 mL, Refills: 0   nicotine (NICODERM CQ) 7 mg/24 hr patch Place 1 patch onto the skin once daily for 42 days Qty: 14 patch, Refills: 2   nicotine polacrilex (NICORETTE) 2 mg gum Take 1 each (2 mg total) by mouth as needed for Smoking cessation for up to 90 days Chew the gum until it tingles and then park it between your cheek and gum. Repeat. Qty: 100 each, Refills: 0   pantoprazole (PROTONIX) 40 MG DR tablet Take 1 tablet (40 mg total) by mouth once daily Qty: 30 tablet, Refills: 0     CONTINUE these medications which have NOT CHANGED  Details  albuterol MDI, PROVENTIL, VENTOLIN, PROAIR, HFA 90 mcg/actuation inhaler Inhale 2 inhalations into the lungs  apixaban (ELIQUIS) 5 mg tablet Take 5 mg by mouth every 12 (twelve) hours   atorvastatin (LIPITOR) 80 MG tablet Take 80 mg by mouth once daily   digoxin (LANOXIN) 0.125 MG tablet Take 0.125 mg by mouth once daily   FARXIGA 10 mg tablet Take 10 mg by mouth once daily   ipratropium-albuteroL (DUO-NEB) nebulizer solution Take 3 mLs by nebulization 4 (four) times daily   metoprolol SUCCinate (TOPROL-XL) 100 MG XL tablet Take 100 mg by mouth once daily   predniSONE (DELTASONE) 10 MG  tablet Take 10 mg by mouth once daily   sacubitriL-valsartan (ENTRESTO) 49-51 mg tablet Take 1 tablet by mouth every 12 (twelve) hours   Brief History of Present Illness:  '68 y.o. male with significant for HFrEF, COPD, HTN, MI, CABG x 3 in 2010; PAD, carotid artery stenosis, A fib on eliquis, smoker who presented to an outside hospital by EMS on 11-22 with severe respiratory distress. Initial ABG 7.08/>103/248. Intubated by ER physician. CVC placed. CTA chest with contrast negative for PE. Aspirated material in the trachea. Advanced emphysema with bullous change in the right mid lung. Stable 5 mm nodule in the right lower lobe (stable since 01/20/22). ECG without evidence of STEMI. CT brain without evidence of acute intracranial process. Follow up ABG post intubation 7.40/51.8/422/32. He was transferred to [facility] ICU on 11/23 for higher level of care.   Since being in ICU 11/23-11/26 he was extubated on 11/24, off pressors on 11/24 and passed swallow eval on 11/25. He received Azithromycin x3d and Cefepime for x5d for COPD exacerbation and received high dose steroids as well. He was diuresed with lasix IV. PT/OT evaluated and recommended HH.   _____________________  Hospital Course by Problem:  Acute on chronic hypoxic/hypercapnic respiratory failure Acute COPD exacerbation Aspiration  Intubated at OSH ED for CO2>100, stomach contents noted in trachea; weaned and extubated on 11/24 to HFNC/ BiPAP qHS. Followed by Pulmonary at [facility] follows with Dr. [name]; baseline PCO2 is around 58-60s with nocturnal PAP therapy. OP Pulm notes he was unable to use trelegy effecively due to very poor inspiratory flows. At baseline wears 2-3L during the day at BiPAP QHS.  He suspects was triggered by recent return to smoking and not wearing O2 while ambulating at home.  - He was treated with Azithromycin x3d and cefepime x5d.  - Treated with 5d course of high dose steroids (last day 11/26) then  resumed on home dose prednisone 10mg  daily and remains stable - started on PPI with his chronic steroids - Continue bronchodilators (home: albuterol, duoneb); prescribed budesonide BID as he was supposed to be on this but was not reported as such - PCP f/u appt scheduled for 11/10/2023 - PT/OT consulted; rec [facility]  Tobacco abuse Counseled on cessation. Shares he quit for 28days following previous admission - feels supported in trying again - Nicotine patch and gum prn - previously was given chantix at last hospital d/c but hasn't started yet and would prefer to do nicotine replacement for now  - referred to tobacco cessation program  HFrEF (EF 27%) paroxysmal Afib Tachycardia + ectopy  CAD s/p CABG 3v Follows with  [facility] in [place]. Dry weight appears ~70-72kg. CHADSVASc score of 4 (Congestive heart failure or low EF (1), Hypertension (1), Vascular disease (PVD, MI, aortic plaque), and Age 58-74 (1)) - Admission TTE (11/23) with severe LV dysfunction (EF 25%), preserved RV function, no significant valve disease  - continue Eliquis -  Continue home: metop succ 100mg  daily - Continue home Digoxin - not on diuretics at home, was given spot dosed 20 mg Lasix IV while in ICU now euvolemic Weight at time of discharge is 150 lbs  PAD carotid artery stenosis HLD Last carotid US 09/30/23-right ICA consistent 1-39% stenosis; ECA > 50% stenosed. Left carotid 60-79% stenosis, ECA > 50% stenosed  - Continue home statin and Zetia  Dysphagia evaluation post intubation Aspiration (noted to have aspirated material in trachea s/p intubation) - SLP consulted for swallow study prior to diet - passed for regular diet/thins  Abnormal troponin: He had an abnormal high-sensitivity troponin panel. Based on my review of all clinical and laboratory information, the abnormal troponin most likely reflects myocardial injury secondary to another medical condition (ICD-10 code I5A).          11/11/2023  f/u ov/[place] office/Dave [name] re: clinically severe copd/bipap dep    last used duoneb/ pulmicort around about 5 h prior/ hfa no/ last prednisone 10 mg  one day prior to OV   Chief Complaint  Patient presents with   Shortness of Breath  Dyspnea:  stays home/ out of breath walking to kitchen whether wearing 02 or not  Cough: none  Sleeping: props up on couch arm /pillows bipap wakes up due to bad dreams  SABA use: as above 02: 3lpm hs  and 3lpm daytime Rec Change lopressor 100 mg to where you take a half twice daily instead of a whole pill once a day Duoneb 4 x daily with Budesonide 0.25 mg on 1st  and 3rd dose  (bfast and supper)  If breathing getting worse >>> Prednisone 10 mg 2 daily until better then one daily   Ok to wear the bipap as much as you need to for comfort purposes and stop all smoking now if you can  Make sure you check your oxygen saturation at your highest level of activity(NOT after you stop)  to be sure it stays over 90% and keep track of it at least once a week, more often if breathing getting worse, and let me know if losing ground. (Collect the dots to connect the dots approach)    Congratulations on not smoking    Please schedule a follow up office visit in 6 weeks, call sooner if needed with all medications /inhalers/ solutions in hand   12/23/2023  f/u ov/[place] office/Dave [name] re: *** maint on *** did *** bring meds  No chief complaint on file.   Dyspnea:  *** Cough: *** Sleeping: ***   resp cc  SABA use: *** 02: ***  Lung cancer screening: ***   No obvious day to day or daytime variability or assoc excess/ purulent sputum or mucus plugs or hemoptysis or cp or chest tightness, subjective wheeze or overt sinus or hb symptoms.    Also denies any obvious fluctuation of symptoms with weather or environmental changes or other aggravating or alleviating factors except as outlined above   No unusual exposure hx or h/o childhood pna/ asthma  or knowledge of premature birth.  Current Allergies, Complete Past Medical History, Past Surgical History, Family History, and Social History were reviewed in [employer] record.  ROS  The following are not active complaints unless bolded Hoarseness, sore throat, dysphagia, dental problems, itching, sneezing,  nasal congestion or discharge of excess mucus or purulent secretions, ear ache,   fever, chills, sweats, unintended wt loss or wt gain, classically pleuritic or exertional cp,  orthopnea pnd or arm/hand  swelling  or leg swelling, presyncope, palpitations, abdominal pain, anorexia, nausea, vomiting, diarrhea  or change in bowel habits or change in bladder habits, change in stools or change in urine, dysuria, hematuria,  rash, arthralgias, visual complaints, headache, numbness, weakness or ataxia or problems with walking or coordination,  change in [name] or  memory.        No outpatient medications have been marked as taking for the 12/23/23 encounter (Appointment) with Nyoka Cowden, MD.            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

## 2024-02-02 ENCOUNTER — Other Ambulatory Visit: Payer: Self-pay | Admitting: Cardiology

## 2024-02-02 DIAGNOSIS — I4892 Unspecified atrial flutter: Secondary | ICD-10-CM

## 2024-02-02 NOTE — Telephone Encounter (Signed)
 Prescription refill request for Eliquis received. Indication:afib Last office visit:11/24 Scr:0.9 Age: 68 Weight:69.9  kg  Prescription refilled

## 2024-02-05 ENCOUNTER — Other Ambulatory Visit: Payer: Self-pay | Admitting: Cardiology

## 2024-02-08 NOTE — Progress Notes (Deleted)
 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 One year      History of Present Illness  10/28/2023  Pulmonary/ 1st office eval/ Dave [name] / Dave [name] Office / trelegy  Chief Complaint  Patient presents with   Establish Care   Shortness of Breath   Dyspnea:  75 ft best days slow/flat surfaces  Cough: min rattling  Sleep: bipap on couch arm plus  pillows  SABA use: 3-4 x per day neb  02: 3lpm  Rec Duoneb 4 x daily with Budesonide 0.25 mg on 1st  and 3rd dose  (bfast and supper)  Prednisone 10 mg 2 daily until better then one daily thereafter Please remember to go to the lab department   for your tests - we will call you with the results when they are available. Ok to wear the bipap as much as you need to for comfort purposes and stop all smoking now if you can  Please schedule a follow up office visit in  2 weeks (next available NP) , sooner if needed  with all medications /inhalers/ solutions in hand so we can verify exactly what you are taking. This includes all medications from all doctors and over the counters   dmit Date: 10/31/2023 Discharge Date: 11/04/2023  Discharge Diagnoses:  Acute exacerbation of chronic obstructive pulmonary disease (COPD) (CMS/HHS-HCC) PAD (peripheral artery disease) (CMS-HCC) Paroxysmal atrial fibrillation (CMS/HHS-HCC) Tobacco abuse disorder Hypertension HFrEF (heart failure with reduced ejection fraction) (CMS/HHS-HCC) Coronary artery disease involving native heart Hypotension due to drugs On mechanically assisted ventilation (CMS/HHS-HCC)  Acute on Chronic Respiratory Failure due to COPD flare, CHF exacerbation and possible aspiration pneumonia.   He was intubated, treated with steroids and antibiotics with cefepime/azithromycin and diuresed with  IV lasix with improvement to baseline.  Changes Made (with rationale):  Nicotine replacement patch and gum Protonix 40 mg daily (for chronic steroid GI ppx) Aspirin 81 mg daily  To-Do List (incidental findings, follow-up studies, etc.):  Anticipatory Guidance for Outpatient Care:  Referred to tobacco cessation program PT/OT recommended Home health but he declined   Results Pending at Discharge:   Please see phone numbers at end of this summary for lab contact information.   START taking these medications  Details  aspirin 81 MG chewable tablet Take 1 tablet (81 mg total) by mouth once daily Qty: 30 tablet, Refills: 0   budesonide (PULMICORT) 0.5 mg/2 mL nebulizer solution Take 2 mLs (0.5 mg total) by nebulization 2 (two) times daily Qty: 60 mL, Refills: 0   nicotine (NICODERM CQ) 7 mg/24 hr patch Place 1 patch onto the skin once daily for 42 days Qty: 14 patch, Refills: 2   nicotine polacrilex (NICORETTE) 2 mg gum Take 1 each (2 mg total) by mouth as needed for Smoking cessation for up to 90 days Chew the gum until it tingles and then park it between your cheek and gum. Repeat. Qty: 100 each, Refills: 0   pantoprazole (PROTONIX) 40 MG Dave tablet Take 1 tablet (40 mg total) by mouth once daily Qty: 30 tablet, Refills: 0     CONTINUE these medications which have NOT CHANGED  Details  albuterol MDI, PROVENTIL, VENTOLIN, PROAIR, HFA 90 mcg/actuation inhaler Inhale 2 inhalations into the lungs  apixaban (ELIQUIS) 5 mg tablet Take 5 mg by mouth every 12 (twelve) hours   atorvastatin (LIPITOR) 80 MG tablet Take 80 mg by mouth once daily   digoxin (LANOXIN) 0.125 MG tablet Take 0.125 mg by mouth once daily   FARXIGA 10 mg tablet Take 10 mg by mouth once daily   ipratropium-albuteroL (DUO-NEB) nebulizer solution Take 3 mLs by nebulization 4 (four) times daily   metoprolol SUCCinate (TOPROL-XL) 100 MG XL tablet Take 100 mg by mouth once daily   predniSONE (DELTASONE) 10 MG  tablet Take 10 mg by mouth once daily   sacubitriL-valsartan (ENTRESTO) 49-51 mg tablet Take 1 tablet by mouth every 12 (twelve) hours   Brief History of Present Illness:  '68 y.o. male with significant for HFrEF, COPD, HTN, MI, CABG x 3 in 2010; PAD, carotid artery stenosis, A fib on eliquis, smoker who presented to an outside hospital by EMS on 11-22 with severe respiratory distress. Initial ABG 7.08/>103/248. Intubated by ER physician. CVC placed. CTA chest with contrast negative for PE. Aspirated material in the trachea. Advanced emphysema with bullous change in the right mid lung. Stable 5 mm nodule in the right lower lobe (stable since 01/20/22). ECG without evidence of STEMI. CT brain without evidence of acute intracranial process. Follow up ABG post intubation 7.40/51.8/422/32. He was transferred to [facility] ICU on 11/23 for higher level of care.   Since being in ICU 11/23-11/26 he was extubated on 11/24, off pressors on 11/24 and passed swallow eval on 11/25. He received Azithromycin x3d and Cefepime for x5d for COPD exacerbation and received high dose steroids as well. He was diuresed with lasix IV. PT/OT evaluated and recommended HH.   _____________________  Hospital Course by Problem:  Acute on chronic hypoxic/hypercapnic respiratory failure Acute COPD exacerbation Aspiration  Intubated at OSH ED for CO2>100, stomach contents noted in trachea; weaned and extubated on 11/24 to HFNC/ BiPAP qHS. Followed by Pulmonary at [facility] follows with Dave. Dave [name]; baseline PCO2 is around 58-60s with nocturnal PAP therapy. OP Pulm notes he was unable to use trelegy effecively due to very poor inspiratory flows. At baseline wears 2-3L during the day at BiPAP QHS.  He suspects was triggered by recent return to smoking and not wearing O2 while ambulating at home.  - He was treated with Azithromycin x3d and cefepime x5d.  - Treated with 5d course of high dose steroids (last day 11/26) then  resumed on home dose prednisone 10mg  daily and remains stable - started on PPI with his chronic steroids - Continue bronchodilators (home: albuterol, duoneb); prescribed budesonide BID as he was supposed to be on this but was not reported as such - PCP f/u appt scheduled for 11/10/2023 - PT/OT consulted; rec [facility]  Tobacco abuse Counseled on cessation. Shares he quit for 28days following previous admission - feels supported in trying again - Nicotine patch and gum prn - previously was given chantix at last hospital d/c but hasn't started yet and would prefer to do nicotine replacement for now  - referred to tobacco cessation program  HFrEF (EF 27%) paroxysmal Afib Tachycardia + ectopy  CAD s/p CABG 3v Follows with [facility] in [place]. Dry weight appears ~70-72kg. CHADSVASc score of 4 (Congestive heart failure or low EF (1), Hypertension (1), Vascular disease (PVD, MI, aortic plaque), and Age 67-74 (1)) - Admission TTE (11/23) with severe LV dysfunction (EF 25%), preserved RV function, no significant valve disease  - continue Eliquis -  Continue home: metop succ 100mg  daily - Continue home Digoxin - not on diuretics at home, was given spot dosed 20 mg Lasix IV while in ICU now euvolemic Weight at time of discharge is 150 lbs  PAD carotid artery stenosis HLD Last carotid US 09/30/23-right ICA consistent 1-39% stenosis; ECA > 50% stenosed. Left carotid 60-79% stenosis, ECA > 50% stenosed  - Continue home statin and Zetia  Dysphagia evaluation post intubation Aspiration (noted to have aspirated material in trachea s/p intubation) - SLP consulted for swallow study prior to diet - passed for regular diet/thins  Abnormal troponin: He had an abnormal high-sensitivity troponin panel. Based on my review of all clinical and laboratory information, the abnormal troponin most likely reflects myocardial injury secondary to another medical condition (ICD-10 code I5A).          11/11/2023  f/u ov/Dave [name] office/Dave [name] re: clinically severe copd/bipap dep    last used duoneb/ pulmicort around about 5 h prior/ hfa no/ last prednisone 10 mg  one day prior to OV   Chief Complaint  Patient presents with   Shortness of Breath  Dyspnea:  stays home/ out of breath walking to kitchen whether wearing 02 or not  Cough: none  Sleeping: props up on couch arm /pillows bipap wakes up due to bad dreams  SABA use: as above 02: 3lpm hs  and 3lpm daytime Rec   02/10/2024  f/u ov/[place] office/Dave [name] re: *** maint on ***  No chief complaint on file.   Dyspnea:  *** Cough: *** Sleeping: ***   resp cc  SABA use: *** 02: ***  Lung cancer screening: ***   No obvious day to day or daytime variability or assoc excess/ purulent sputum or mucus plugs or hemoptysis or cp or chest tightness, subjective wheeze or overt sinus or hb symptoms.    Also denies any obvious fluctuation of symptoms with weather or environmental changes or other aggravating or alleviating factors except as outlined above   No unusual exposure hx or h/o childhood pna/ asthma or knowledge of premature birth.  Current Allergies, Complete Past Medical History, Past Surgical History, Family History, and Social History were reviewed in [employer] record.  ROS  The following are not active complaints unless bolded Hoarseness, sore throat, dysphagia, dental problems, itching, sneezing,  nasal congestion or discharge of excess mucus or purulent secretions, ear ache,   fever, chills, sweats, unintended wt loss or wt gain, classically pleuritic or exertional cp,  orthopnea pnd or arm/hand swelling  or leg swelling, presyncope, palpitations, abdominal pain, anorexia, nausea, vomiting, diarrhea  or change in bowel habits or change in bladder habits, change in stools or change in urine, dysuria, hematuria,  rash, arthralgias, visual complaints, headache, numbness, weakness or ataxia or  problems with walking or coordination,  change in [name] or  memory.        No outpatient medications have been marked as taking for the 02/10/24 encounter (Appointment) with Dave Cowden, MD.            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

## 2024-02-10 ENCOUNTER — Ambulatory Visit: Payer: Medicare Other | Admitting: Internal Medicine

## 2024-02-10 ENCOUNTER — Other Ambulatory Visit: Payer: Self-pay | Admitting: Internal Medicine

## 2024-02-12 ENCOUNTER — Encounter: Payer: Self-pay | Admitting: Nurse Practitioner

## 2024-02-12 ENCOUNTER — Ambulatory Visit: Payer: Medicare Other | Attending: Nurse Practitioner | Admitting: Nurse Practitioner

## 2024-02-12 VITALS — BP 112/58 | HR 78 | Ht 69.0 in | Wt 154.0 lb

## 2024-02-12 DIAGNOSIS — Z720 Tobacco use: Secondary | ICD-10-CM | POA: Diagnosis not present

## 2024-02-12 DIAGNOSIS — J449 Chronic obstructive pulmonary disease, unspecified: Secondary | ICD-10-CM | POA: Diagnosis not present

## 2024-02-12 DIAGNOSIS — I255 Ischemic cardiomyopathy: Secondary | ICD-10-CM

## 2024-02-12 DIAGNOSIS — I5022 Chronic systolic (congestive) heart failure: Secondary | ICD-10-CM | POA: Diagnosis not present

## 2024-02-12 DIAGNOSIS — R0609 Other forms of dyspnea: Secondary | ICD-10-CM | POA: Diagnosis not present

## 2024-02-12 NOTE — Progress Notes (Signed)
 Cardiology Office Note:  .   Date:  02/12/2024 ID:  Dave [name], DOB [birth year], MRN [id number] PCP: Ignatius Specking, MD  [facility] Providers Cardiologist:  [name], MD    History of Present Illness: .   Dave [name] is a 68 y.o. male with a PMH of CAD, s/p CABG x 3 in 2010, A-fib, PAD, chronic systolic CHF, ICM, carotid artery stenosis, hypertension, former smoker, COPD, who presents today for overdue 70-month follow-up appointment.  Last seen for preoperative cardiovascular risk assessment on October 23, 2022 with Bernadene Person, NP.  He was pending dental extraction at that time.  Was overall doing well at the time.  Last seen by Dr. [name] on June 18, 2022, had noted ongoing significant shortness of breath/dyspnea on exertion and was referred to pulmonary -missed his last appointment, had some soft BPs with some dizziness at times, Entresto was lowered to 49/51 mg twice daily, due to borderline high potassium levels, Aldactone was not started.  In the interim, he has had several ED visits/hospital admissions.  More recently, he has had a recent hospital admission at [facility] for COPD exacerbation.  CT scan revealed stable right lower lobe nodule.  Echocardiogram in hospital revealed EF reduced at 35 to 40% with repeat echo revealing worsening EF to 25 to 30%.  Recommended to follow-up outpatient cardiologist.  09/25/2023 - Today presents for outpatient follow-up.  He presents today with his family member.  He is doing well, and states he quit smoking 18 days ago.  Family member says normally he would be wearing oxygen for his office visit, but now he does not need to wear it, does wear it PRN.  They are requesting a referral to pulmonology as he is overdue for an office visit with his pulmonologist. Denies any chest pain, shortness of breath, palpitations, syncope, presyncope, dizziness, orthopnea, PND, swelling or significant weight changes, acute bleeding, or  claudication.  10/30/2023 - Patient presents today for follow-up.  Patient has returned to smoking.  Daughter states ever since he returned to smoking he has noticed more shortness of breath.  Patient reports smoking about half pack per day.  Patient says he is winded and is requesting to leave office to go use his nebulizer. Denies any chest pain,  palpitations, syncope, presyncope, dizziness, orthopnea, PND, swelling or significant weight changes, acute bleeding, or claudication.   Hospitalized 10/2023 for acute respiratory failure. Transferred to [facility] and tx for COPD flaure, CHF exacerbation, possible aspiration PNA. Was intubated, tx with steroids and ABX as well as IV Lasix.   02/12/2024 - Doing pretty well from a cardiac standpoint. Says he is using a machine similar to a BIPAP to help with his breathing that helps. Admits to stable DOE. Denies any chest pain, palpitations, syncope, presyncope, dizziness, orthopnea, PND, swelling or significant weight changes, acute bleeding, or claudication. Continues to quit, working on weaning off nicotine on his own.   ROS: Negative.  See HPI.  Studies Reviewed: Marland Kitchen    EKG: EKG is not ordered today.  Echo 10/31/2023 ([facility]): LVEF 25%, no LVH, grade 1 DD, normal RV systolic function, no valvular regurgitation or valvular stenosis.     Carotid duplex 10/2023: Summary:  Right Carotid: Velocities in the right ICA are consistent with a 1-39%  stenosis. Non-hemodynamically significant plaque <50% noted in the CCA. The ECA appears >50% stenosed.   Left Carotid: Velocities in the left ICA are consistent with a 60-79%  stenosis. Non-hemodynamically significant plaque <  50% noted in the CCA. The ECA appears >50% stenosed.   Vertebrals:  Right vertebral artery demonstrates antegrade flow. Left  vertebral color flow-poorly visualized.  Subclavians: Bilateral subclavian artery flow was disturbed.   *See table(s) above for measurements and observations.  Suggest  follow up study in 12 months. 2D echocardiogram 09/2023 [facility]): 1. The left ventricle is moderately dilated in size with normal wall thickness. 2. The left ventricular systolic function is severely decreased, LVEF is visually estimated at 25-30%. 3. There is mild mitral valve regurgitation. 4. The aortic valve is trileaflet with mildly thickened leaflets with normal excursion. 5. The left atrium is mildly dilated in size. 6. The right ventricle is mildly dilated in size, with mildly reduced systolic function. 7. IVC size and inspiratory change suggest elevated right atrial pressure. (10-20 mmHg). Left Ventricle The left ventricle is moderately dilated in size with normal wall thickness. The left ventricular systolic function is severely decreased, LVEF is visually estimated at 25-30%. Left ventricular diastolic function cannot be accurately assessed. Right Ventricle The right ventricle is mildly dilated in size, with mildly reduced systolic function. Left Atrium The left atrium is mildly dilated in size. Right Atrium The right atrium is normal in size. Aortic Valve The aortic valve is trileaflet with mildly thickened leaflets with normal excursion. There is trivial aortic regurgitation. There is no evidence of a significant transvalvular gradient. Mitral Valve The mitral valve leaflets are normal with normal leaflet mobility. There is mild mitral valve regurgitation. Tricuspid Valve The tricuspid valve leaflets are normal, with normal leaflet mobility. There is no significant tricuspid regurgitation. The pulmonary systolic pressure cannot be estimated due to insufficient TR signal. Pulmonic Valve Pulmonary valve is not well visualized. There is no significant pulmonic regurgitation. There is no evidence of a significant transvalvular gradient. Aorta The aorta is normal in size in the visualized segments. Inferior Vena Cava IVC size and inspiratory change suggest elevated right atrial pressure. (10-20 mmHg).  Pericardium/Pleural There is no pericardial effusion.  Carotid duplex 08/2022: Summary:  Right Carotid: Velocities in the right ICA are consistent with a 1-39%  stenosis.                The ECA appears >50% stenosed.   Left Carotid: Velocities in the left ICA are consistent with a 40-59%  stenosis.               Non-hemodynamically significant plaque <50% noted in the  CCA. The                ECA appears >50% stenosed.   Vertebrals:  Bilateral vertebral arteries demonstrate antegrade flow.  Subclavians: Normal flow hemodynamics were seen in bilateral subclavian               arteries.   *See table(s) above for measurements and observations.  Suggest follow up study in 12 months.  Echo 11/2021: 1. Left ventricular ejection fraction, by estimation, is 35 to 40%. The  left ventricle has moderately decreased function. The left ventricle  demonstrates regional wall motion abnormalities (see scoring  diagram/findings for description). The left  ventricular internal cavity size was mildly dilated. Left ventricular  diastolic parameters are consistent with Grade I diastolic dysfunction  (impaired relaxation).   2. Right ventricular systolic function is mildly reduced. The right  ventricular size is normal. There is normal pulmonary artery systolic  pressure. The estimated right ventricular systolic pressure is 27.2 mmHg.   3. Left atrial size was upper normal.  4. The mitral valve is grossly normal. Trivial mitral valve  regurgitation.   5. The aortic valve is tricuspid. Aortic valve regurgitation is not  visualized.   6. The inferior vena cava is normal in size with greater than 50%  respiratory variability, suggesting right atrial pressure of 3 mmHg.   Comparison(s): Prior images unable to be directly viewed.  Right/left heart cath 12/2016: Conclusions: 1.  Significant 2-vessel native coronary artery disease, including moderate diffuse proximal LAD disease and chronic total  occlusion of mid LAD and ostial RCA.  70% proximal D1 stenosis is also present. 2.  Moderate, non-obstructive disease involving LCx.  Small branch of OM2 is chronically occluded. 3.  Widely patent LIMA->LAD. 4.  Patent SVG->rPDA with mild diffuse disease and discrete 95% stenosis at the distal anastomosis. 5.  Chronically occluded SVG->D1. 6.  Mildly elevated left and right heart filling pressures, as well as moderate pulmonary hypertension. 7.  Normal to mildly reduced cardiac output. 8.  Successful PCI to distal SVG->rPDA anastomosis using a Resolute Onxy 4.0 x 12 mm drug-eluting stent with 0% residual stenosis and TIMI-3 flow.   Recommendations: 1.  Admit for overnight observation following PCI with severe systolic dysfunction. 2.  Dual antiplatelet therapy with aspirin and ticagrelor for at least 6-12 months, ideally indefinitely.  If patient experiences respiratory side effects from ticagrelor, he could be switched to clopidogrel. 3.  Avoid further IV hydration today; restart diuresis tomorrow if renal function allows. 4.  Aggressive medical therapy for severe ischemic cardiomyopathy.  If renal function stable, can consider starting ACEI tomorrow. 5.  Close outpatient follow-up with Dr. Wyline Mood.  Physical Exam:   VS:  BP (!) 112/58 (BP Location: Right Arm, Patient Position: Sitting, Cuff Size: Normal)   Pulse 78   Ht 5\' 9"  (1.753 m)   Wt 154 lb (69.9 kg)   SpO2 97% Comment: 3 liter oxygen  BMI 22.74 kg/m    Wt Readings from Last 3 Encounters:  02/12/24 154 lb (69.9 kg)  11/11/23 154 lb (69.9 kg)  10/30/23 164 lb 6.4 oz (74.6 kg)    GEN: Thin, 68 year old male in no acute distress NECK: No JVD; No carotid bruits CARDIAC: S1/S2, irregularly irregular rhythm, no murmurs, rubs, gallops RESPIRATORY:  Audible wheezing while entering exam room, diminished with expiratory wheezing to auscultation, increased work of breathing, tachypnea at rest  EXTREMITIES:  No edema; No deformity    ASSESSMENT AND PLAN: .    Chronic systolic CHF, ICM Stage C, NYHA class I-II symptoms.  EF previously found to be reduced to 25 to 30% at [facility] in October 2024, Echo 10/2023 at [facility] found EF 25%. Euvolemic and well compensated on exam. He has turned down ICD in past. Recommend HF clinic referral, will run this past attending cardiologist. Continue Digoxin, Farxiga, Lasix, Toprol XL, and Entresto. GDMT limited d/t BP trends.  No medication changes at this time.  Low sodium diet, fluid restriction <2L, and daily weights encouraged. Educated to contact our office for weight gain of 2 lbs overnight or 5 lbs in one week.  COPD, DOE Admits to stable DOE, however improved from hospitalization. Continue current medication regimen.  Continue to follow-up with Dr. Sherene Sires.   3. Tobacco abuse Smoking cessation encouraged and discussed.   Dispo: Will provide refill per his request. Follow-up with me/APP in 6-8 weeks or sooner anything changes.  Signed, Sharlene Dory, NP

## 2024-02-12 NOTE — Patient Instructions (Addendum)

## 2024-02-18 ENCOUNTER — Telehealth: Payer: Self-pay | Admitting: Nurse Practitioner

## 2024-02-18 DIAGNOSIS — I5022 Chronic systolic (congestive) heart failure: Secondary | ICD-10-CM

## 2024-02-18 NOTE — Telephone Encounter (Signed)
 Left patient a message to return our call regarding referral

## 2024-02-18 NOTE — Telephone Encounter (Signed)
-----   Message from Sharlene Dory sent at 02/17/2024  3:42 PM EDT ----- I heard back from Dr. Wyline Mood. He said okay to refer to heart failure clinic. Let's arrange this for him.   Thanks!   Best, Sharlene Dory, NP ----- Message ----- From: Antoine Poche, MD Sent: 02/17/2024  12:32 PM EDT To: Sharlene Dory, NP  That would be fine  Dominga Ferry MD ----- Message ----- From: Sharlene Dory, NP Sent: 02/15/2024  12:57 PM EDT To: Antoine Poche, MD  Doing better since I last saw him. EF still 25%, okay to refer to HF clinic? Wanted to check with you.   Thanks!   Best, Sharlene Dory, NP

## 2024-02-19 NOTE — Telephone Encounter (Signed)
 Left message

## 2024-02-19 NOTE — Telephone Encounter (Signed)
 Left message on phone per patient DPR.  Referral has been sent

## 2024-03-02 ENCOUNTER — Other Ambulatory Visit: Payer: Self-pay | Admitting: Nurse Practitioner

## 2024-03-29 NOTE — Progress Notes (Deleted)
 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 One year      History of Present Illness  10/28/2023  Pulmonary/ 1st office eval/ [facility] Broady / [facility] / trelegy  Chief Complaint  Patient presents with   Establish Care   Shortness of Breath   Dyspnea:  75 ft best days slow/flat surfaces  Cough: min rattling  Sleep: bipap on couch arm plus  pillows  SABA use: 3-4 x per day neb  02: 3lpm  Rec Duoneb 4 x daily with Budesonide  0.25 mg on 1st  and 3rd dose  (bfast and supper)  Prednisone  10 mg 2 daily until better then one daily thereafter Please remember to go to the lab department   for your tests - we will call you with the results when they are available. Ok to wear the bipap as much as you need to for comfort purposes and stop all smoking now if you can  Please schedule a follow up office visit in  2 weeks (next available NP) , sooner if needed  with all medications /inhalers/ solutions in hand so we can verify exactly what you are taking. This includes all medications from all doctors and over the counters   dmit Date: 10/31/2023 Discharge Date: 11/04/2023  Discharge Diagnoses:  Acute exacerbation of chronic obstructive pulmonary disease (COPD) (CMS/HHS-HCC) PAD (peripheral artery disease) (CMS-HCC) Paroxysmal atrial fibrillation (CMS/HHS-HCC) Tobacco abuse disorder Hypertension HFrEF (heart failure with reduced ejection fraction) (CMS/HHS-HCC) Coronary artery disease involving native heart Hypotension due to drugs On mechanically assisted ventilation (CMS/HHS-HCC)  Acute on Chronic Respiratory Failure due to COPD flare, CHF exacerbation and possible aspiration pneumonia.   He was intubated, treated with steroids and antibiotics with cefepime/azithromycin and diuresed with  IV lasix  with improvement to baseline.  Changes Made (with rationale):  Nicotine replacement patch and gum Protonix 40 mg daily (for chronic steroid GI ppx) Aspirin  81 mg daily  To-Do List (incidental findings, follow-up studies, etc.):  Anticipatory Guidance for Outpatient Care:  Referred to tobacco cessation program PT/OT recommended Home health but he declined   Results Pending at Discharge:   Please see phone numbers at end of this summary for lab contact information.   START taking these medications  Details  aspirin  81 MG chewable tablet Take 1 tablet (81 mg total) by mouth once daily Qty: 30 tablet, Refills: 0   budesonide  (PULMICORT ) 0.5 mg/2 mL nebulizer solution Take 2 mLs (0.5 mg total) by nebulization 2 (two) times daily Qty: 60 mL, Refills: 0   nicotine (NICODERM CQ) 7 mg/24 hr patch Place 1 patch onto the skin once daily for 42 days Qty: 14 patch, Refills: 2   nicotine polacrilex (NICORETTE) 2 mg gum Take 1 each (2 mg total) by mouth as needed for Smoking cessation for up to 90 days Chew the gum until it tingles and then park it between your cheek and gum. Repeat. Qty: 100 each, Refills: 0   pantoprazole (PROTONIX) 40 MG DR tablet Take 1 tablet (40 mg total) by mouth once daily Qty: 30 tablet, Refills: 0     CONTINUE these medications which have NOT CHANGED  Details  albuterol  MDI, PROVENTIL , VENTOLIN , PROAIR , HFA 90 mcg/actuation inhaler Inhale 2 inhalations into the lungs  apixaban  (ELIQUIS ) 5 mg tablet Take 5 mg by mouth every 12 (twelve) hours   atorvastatin  (LIPITOR ) 80 MG tablet Take 80 mg by mouth once daily   digoxin (LANOXIN) 0.125 MG tablet Take 0.125 mg by mouth once daily   FARXIGA  10 mg tablet Take 10 mg by mouth once daily   ipratropium-albuteroL  (DUO-NEB) nebulizer solution Take 3 mLs by nebulization 4 (four) times daily   metoprolol  SUCCinate (TOPROL -XL) 100 MG XL tablet Take 100 mg by mouth once daily   predniSONE  (DELTASONE ) 10 MG  tablet Take 10 mg by mouth once daily   sacubitriL -valsartan  (ENTRESTO ) 49-51 mg tablet Take 1 tablet by mouth every 12 (twelve) hours   Brief History of Present Illness:  '68 y.o. male with significant for HFrEF, COPD, HTN, MI, CABG x 3 in 2010; PAD, carotid artery stenosis, A fib on eliquis , smoker who presented to an outside hospital by EMS on 11-22 with severe respiratory distress. Initial ABG 7.08/>103/248. Intubated by ER physician. CVC placed. CTA chest with contrast negative for PE. Aspirated material in the trachea. Advanced emphysema with bullous change in the right mid lung. Stable 5 mm nodule in the right lower lobe (stable since 01/20/22). ECG without evidence of STEMI. CT brain without evidence of acute intracranial process. Follow up ABG post intubation 7.40/51.8/422/32. He was transferred to [facility] ICU on 11/23 for higher level of care.   Since being in ICU 11/23-11/26 he was extubated on 11/24, off pressors on 11/24 and passed swallow eval on 11/25. He received Azithromycin x3d and Cefepime for x5d for COPD exacerbation and received high dose steroids as well. He was diuresed with lasix  IV. PT/OT evaluated and recommended HH.   _____________________  Hospital Course by Problem:  Acute on chronic hypoxic/hypercapnic respiratory failure Acute COPD exacerbation Aspiration  Intubated at OSH ED for CO2>100, stomach contents noted in trachea; weaned and extubated on 11/24 to HFNC/ BiPAP qHS. Followed by Pulmonary at [facility] follows with Dr. Waymond Hailey; baseline PCO2 is around 58-60s with nocturnal PAP therapy. OP Pulm notes he was unable to use trelegy effecively due to very poor inspiratory flows. At baseline wears 2-3L during the day at BiPAP QHS.  He suspects was triggered by recent return to smoking and not wearing O2 while ambulating at home.  - He was treated with Azithromycin x3d and cefepime x5d.  - Treated with 5d course of high dose steroids (last day 11/26) then  resumed on home dose prednisone  10mg  daily and remains stable - started on PPI with his chronic steroids - Continue bronchodilators (home: albuterol , duoneb); prescribed budesonide  BID as he was supposed to be on this but was not reported as such - PCP f/u appt scheduled for 11/10/2023 - PT/OT consulted; rec [facility]  Tobacco abuse Counseled on cessation. Shares he quit for 28days following previous admission - feels supported in trying again - Nicotine patch and gum prn - previously was given chantix at last hospital d/c but hasn't started yet and would prefer to do nicotine replacement for now  - referred to tobacco cessation program  HFrEF (EF 27%) paroxysmal Afib Tachycardia + ectopy  CAD s/p CABG 3v Follows with [facility] at [place] in [place]. Dry weight appears ~70-72kg. CHADSVASc score of 4 (Congestive heart failure or low EF (1), Hypertension (1), Vascular disease (PVD, MI, aortic plaque), and Age 43-74 (1)) - Admission TTE (11/23) with severe LV dysfunction (EF 25%), preserved RV function, no significant valve disease  - continue Eliquis  -  Continue home: metop succ 100mg  daily - Continue home Digoxin - not on diuretics at home, was given spot dosed 20 mg Lasix  IV while in ICU now euvolemic Weight at time of discharge is 150 lbs  PAD carotid artery stenosis HLD Last carotid us  09/30/23-right ICA consistent 1-39% stenosis; ECA > 50% stenosed. Left carotid 60-79% stenosis, ECA > 50% stenosed  - Continue home statin and Zetia  Dysphagia evaluation post intubation Aspiration (noted to have aspirated material in trachea s/p intubation) - SLP consulted for swallow study prior to diet - passed for regular diet/thins  Abnormal troponin: He had an abnormal high-sensitivity troponin panel. Based on my review of all clinical and laboratory information, the abnormal troponin most likely reflects myocardial injury secondary to another medical condition (ICD-10 code I5A).          11/11/2023  f/u ov/[place] office/Tino Ronan re: clinically severe copd/bipap dep    last used duoneb/ pulmicort  around about 5 h prior/ hfa no/ last prednisone  10 mg  one day prior to OV   Chief Complaint  Patient presents with   Shortness of Breath  Dyspnea:  stays home/ out of breath walking to kitchen whether wearing 02 or not  Cough: none  Sleeping: props up on couch arm /pillows bipap wakes up due to bad dreams  SABA use: as above 02: 3lpm hs  and 3lpm daytime/  Rec Change lopressor  100 mg to where you take a half twice daily instead of a whole pill once a day Duoneb 4 x daily with Budesonide  0.25 mg on 1st  and 3rd dose  (bfast and supper)  If breathing getting worse >>> Prednisone  10 mg 2 daily until better then one daily   Ok to wear the bipap as much as you need to for comfort purposes and stop all smoking now if you can  Make sure you check your oxygen  saturation at your highest level of activity(NOT after you stop)  to be sure it stays over 90%   Congratulations on not smoking   Please schedule a follow up office visit in 6 weeks, call sooner if needed with all medications /inhalers/ solutions in hand  03/30/2024  f/u ov/[place] office/Marcelline Temkin re: *** maint on *** did *** bring meds  No chief complaint on file.   Dyspnea:  *** Cough: *** Sleeping: ***   resp cc  SABA use: *** 02: ***  Lung cancer screening: ***   No obvious day to day or daytime variability or assoc excess/ purulent sputum or mucus plugs or hemoptysis or cp or chest tightness, subjective wheeze or overt sinus or hb symptoms.    Also denies any obvious fluctuation of symptoms with weather or environmental changes or other aggravating or alleviating factors except as outlined above   No unusual exposure hx or h/o childhood pna/ asthma or knowledge of premature birth.  Current Allergies, Complete Past Medical History, Past Surgical History, Family History, and Social History were reviewed in  [employer] record.  ROS  The following are not active complaints unless bolded Hoarseness, sore throat, dysphagia, dental problems, itching, sneezing,  nasal congestion or discharge of excess mucus or purulent secretions, ear ache,   fever, chills, sweats, unintended wt loss or wt gain, classically pleuritic or exertional cp,  orthopnea pnd or arm/hand swelling  or leg swelling, presyncope, palpitations, abdominal pain, anorexia, nausea, vomiting, diarrhea  or change in bowel habits or change in bladder habits, change in stools or change in urine, dysuria, hematuria,  rash, arthralgias, visual complaints, headache, numbness, weakness or ataxia or problems with walking or coordination,  change in mood or  memory.        No outpatient medications have been marked as taking for the 03/30/24 encounter (Appointment) with Diamond Formica, MD.        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

## 2024-03-30 ENCOUNTER — Encounter: Payer: Self-pay | Admitting: Internal Medicine

## 2024-03-30 ENCOUNTER — Ambulatory Visit: Admitting: Internal Medicine

## 2024-04-01 ENCOUNTER — Ambulatory Visit: Attending: Nurse Practitioner | Admitting: Nurse Practitioner

## 2024-04-01 ENCOUNTER — Encounter: Payer: Self-pay | Admitting: Nurse Practitioner

## 2024-04-01 VITALS — BP 138/64 | HR 64 | Ht 70.0 in | Wt 153.0 lb

## 2024-04-01 DIAGNOSIS — I5022 Chronic systolic (congestive) heart failure: Secondary | ICD-10-CM | POA: Insufficient documentation

## 2024-04-01 DIAGNOSIS — I509 Heart failure, unspecified: Secondary | ICD-10-CM

## 2024-04-01 DIAGNOSIS — I255 Ischemic cardiomyopathy: Secondary | ICD-10-CM | POA: Insufficient documentation

## 2024-04-01 DIAGNOSIS — Z720 Tobacco use: Secondary | ICD-10-CM | POA: Diagnosis not present

## 2024-04-01 DIAGNOSIS — J449 Chronic obstructive pulmonary disease, unspecified: Secondary | ICD-10-CM | POA: Insufficient documentation

## 2024-04-01 DIAGNOSIS — R0609 Other forms of dyspnea: Secondary | ICD-10-CM | POA: Diagnosis not present

## 2024-04-01 NOTE — Progress Notes (Signed)
 Cardiology Office Note:  .   Date: 04/01/2024 ID:  Dave [name], DOB [birth year], MRN [id number] PCP: Orlena Bitters, MD  [facility] Providers Cardiologist:  Armida Lander, MD    History of Present Illness: .   Dave [name] is a 68 y.o. male with a PMH of CAD, s/p CABG x 3 in 2010, A-fib, PAD, chronic systolic CHF, ICM, carotid artery stenosis, hypertension, former smoker, COPD, who presents today for follow-up appointment.  Last seen for preoperative cardiovascular risk assessment on October 23, 2022 with Marlana Silvan, NP.  He was pending dental extraction at that time.  Was overall doing well at the time.  Last seen by Dr. Armida Lander on June 18, 2022, had noted ongoing significant shortness of breath/dyspnea on exertion and was referred to pulmonary -missed his last appointment, had some soft BPs with some dizziness at times, Entresto  was lowered to 49/51 mg twice daily, due to borderline high potassium levels, Aldactone was not started.  In the interim, he has had several ED visits/hospital admissions.  More recently, he has had a recent hospital admission at [facility] for COPD exacerbation.  CT scan revealed stable right lower lobe nodule.  Echocardiogram in hospital revealed EF reduced at 35 to 40% with repeat echo revealing worsening EF to 25 to 30%.  Recommended to follow-up outpatient cardiologist.  09/25/2023 - Today presents for outpatient follow-up.  He presents today with his family member.  He is doing well, and states he quit smoking 18 days ago.  Family member says normally he would be wearing oxygen  for his office visit, but now he does not need to wear it, does wear it PRN.  They are requesting a referral to pulmonology as he is overdue for an office visit with his pulmonologist. Denies any chest pain, shortness of breath, palpitations, syncope, presyncope, dizziness, orthopnea, PND, swelling or significant weight changes, acute bleeding, or claudication.  10/30/2023  - Patient presents today for follow-up.  Patient has returned to smoking.  Daughter states ever since he returned to smoking he has noticed more shortness of breath.  Patient reports smoking about half pack per day.  Patient says he is winded and is requesting to leave office to go use his nebulizer. Denies any chest pain,  palpitations, syncope, presyncope, dizziness, orthopnea, PND, swelling or significant weight changes, acute bleeding, or claudication.   Hospitalized 10/2023 for acute respiratory failure. Transferred to [facility] and tx for COPD flaure, CHF exacerbation, possible aspiration PNA. Was intubated, tx with steroids and ABX as well as IV Lasix .   02/12/2024 - Doing pretty well from a cardiac standpoint. Says he is using a machine similar to a BIPAP to help with his breathing that helps. Admits to stable DOE. Denies any chest pain, palpitations, syncope, presyncope, dizziness, orthopnea, PND, swelling or significant weight changes, acute bleeding, or claudication. Continues to smoke, working on weaning off nicotine on his own.   04/01/2024 - Presents today for follow-up with his daughter. Doing well from a cardiac standpoint. Denies any chest pain, palpitations, syncope, presyncope, dizziness, orthopnea, PND, swelling or significant weight changes, acute bleeding, or claudication. Continues to smoke, daughter says he is weaning his nicotine usage. Tolerating medications well.   ROS: Negative.  See HPI.  Studies Reviewed: Aaron Aas    EKG: EKG is not ordered today.  Echo 10/31/2023 ([facility]): LVEF 25%, no LVH, grade 1 DD, normal RV systolic function, no valvular regurgitation or valvular stenosis.     Carotid duplex 10/2023: Summary:  Right Carotid: Velocities in the right ICA are consistent with a 1-39%  stenosis. Non-hemodynamically significant plaque <50% noted in the CCA. The ECA appears >50% stenosed.   Left Carotid: Velocities in the left ICA are consistent with a 60-79%  stenosis.  Non-hemodynamically significant plaque <50% noted in the CCA. The ECA appears >50% stenosed.   Vertebrals:  Right vertebral artery demonstrates antegrade flow. Left  vertebral color flow-poorly visualized.  Subclavians: Bilateral subclavian artery flow was disturbed.   *See table(s) above for measurements and observations.  Suggest follow up study in 12 months. 2D echocardiogram 09/2023 [facility]): 1. The left ventricle is moderately dilated in size with normal wall thickness. 2. The left ventricular systolic function is severely decreased, LVEF is visually estimated at 25-30%. 3. There is mild mitral valve regurgitation. 4. The aortic valve is trileaflet with mildly thickened leaflets with normal excursion. 5. The left atrium is mildly dilated in size. 6. The right ventricle is mildly dilated in size, with mildly reduced systolic function. 7. IVC size and inspiratory change suggest elevated right atrial pressure. (10-20 mmHg). Left Ventricle The left ventricle is moderately dilated in size with normal wall thickness. The left ventricular systolic function is severely decreased, LVEF is visually estimated at 25-30%. Left ventricular diastolic function cannot be accurately assessed. Right Ventricle The right ventricle is mildly dilated in size, with mildly reduced systolic function. Left Atrium The left atrium is mildly dilated in size. Right Atrium The right atrium is normal in size. Aortic Valve The aortic valve is trileaflet with mildly thickened leaflets with normal excursion. There is trivial aortic regurgitation. There is no evidence of a significant transvalvular gradient. Mitral Valve The mitral valve leaflets are normal with normal leaflet mobility. There is mild mitral valve regurgitation. Tricuspid Valve The tricuspid valve leaflets are normal, with normal leaflet mobility. There is no significant tricuspid regurgitation. The pulmonary systolic pressure cannot be estimated due to insufficient TR  signal. Pulmonic Valve Pulmonary valve is not well visualized. There is no significant pulmonic regurgitation. There is no evidence of a significant transvalvular gradient. Aorta The aorta is normal in size in the visualized segments. Inferior Vena Cava IVC size and inspiratory change suggest elevated right atrial pressure. (10-20 mmHg). Pericardium/Pleural There is no pericardial effusion.  Carotid duplex 08/2022: Summary:  Right Carotid: Velocities in the right ICA are consistent with a 1-39%  stenosis.                The ECA appears >50% stenosed.   Left Carotid: Velocities in the left ICA are consistent with a 40-59%  stenosis.               Non-hemodynamically significant plaque <50% noted in the  CCA. The                ECA appears >50% stenosed.   Vertebrals:  Bilateral vertebral arteries demonstrate antegrade flow.  Subclavians: Normal flow hemodynamics were seen in bilateral subclavian               arteries.   *See table(s) above for measurements and observations.  Suggest follow up study in 12 months.  Echo 11/2021: 1. Left ventricular ejection fraction, by estimation, is 35 to 40%. The  left ventricle has moderately decreased function. The left ventricle  demonstrates regional wall motion abnormalities (see scoring  diagram/findings for description). The left  ventricular internal cavity size was mildly dilated. Left ventricular  diastolic parameters are consistent with Grade I diastolic dysfunction  (impaired  relaxation).   2. Right ventricular systolic function is mildly reduced. The right  ventricular size is normal. There is normal pulmonary artery systolic  pressure. The estimated right ventricular systolic pressure is 27.2 mmHg.   3. Left atrial size was upper normal.   4. The mitral valve is grossly normal. Trivial mitral valve  regurgitation.   5. The aortic valve is tricuspid. Aortic valve regurgitation is not  visualized.   6. The inferior vena cava is normal  in size with greater than 50%  respiratory variability, suggesting right atrial pressure of 3 mmHg.   Comparison(s): Prior images unable to be directly viewed.  Right/left heart cath 12/2016: Conclusions: 1.  Significant 2-vessel native coronary artery disease, including moderate diffuse proximal LAD disease and chronic total occlusion of mid LAD and ostial RCA.  70% proximal D1 stenosis is also present. 2.  Moderate, non-obstructive disease involving LCx.  Small branch of OM2 is chronically occluded. 3.  Widely patent LIMA->LAD. 4.  Patent SVG->rPDA with mild diffuse disease and discrete 95% stenosis at the distal anastomosis. 5.  Chronically occluded SVG->D1. 6.  Mildly elevated left and right heart filling pressures, as well as moderate pulmonary hypertension. 7.  Normal to mildly reduced cardiac output. 8.  Successful PCI to distal SVG->rPDA anastomosis using a Resolute Onxy 4.0 x 12 mm drug-eluting stent with 0% residual stenosis and TIMI-3 flow.   Recommendations: 1.  Admit for overnight observation following PCI with severe systolic dysfunction. 2.  Dual antiplatelet therapy with aspirin  and ticagrelor  for at least 6-12 months, ideally indefinitely.  If patient experiences respiratory side effects from ticagrelor , he could be switched to clopidogrel. 3.  Avoid further IV hydration today; restart diuresis tomorrow if renal function allows. 4.  Aggressive medical therapy for severe ischemic cardiomyopathy.  If renal function stable, can consider starting ACEI tomorrow. 5.  Close outpatient follow-up with Dr. Amanda Jungling.  Physical Exam:   VS:  BP 138/64 (BP Location: Right Arm, Patient Position: Sitting, Cuff Size: Normal)   Pulse 64   Ht 5\' 10"  (1.778 m)   Wt 153 lb (69.4 kg)   SpO2 97%   BMI 21.95 kg/m    Wt Readings from Last 3 Encounters:  04/01/24 153 lb (69.4 kg)  02/12/24 154 lb (69.9 kg)  11/11/23 154 lb (69.9 kg)    GEN: Thin, 68 year old male in no acute distress NECK:  No JVD; No carotid bruits CARDIAC: S1/S2, irregularly irregular rhythm, no murmurs, rubs, gallops RESPIRATORY:  Audible wheezing while entering exam room, diminished with expiratory wheezing to auscultation, increased work of breathing, tachypnea at rest  EXTREMITIES:  No edema; No deformity   ASSESSMENT AND PLAN: .    Chronic systolic CHF, ICM Stage C, NYHA class I-II symptoms.  EF previously found to be reduced to 25 to 30% at [facility] in October 2024, Echo 10/2023 at [facility] found EF 25%. Euvolemic and well compensated on exam. He has turned down ICD in past. Will update limited Echo after consulting with Dr. Amanda Jungling who recommended this. Continue Digoxin, Farxiga , Lasix , Toprol  XL, and Entresto . GDMT limited d/t BP trends.  No medication changes at this time.  Low sodium diet, fluid restriction <2L, and daily weights encouraged. Educated to contact our office for weight gain of 2 lbs overnight or 5 lbs in one week. Follow-up with HF clinic as scheduled. Will arrange CBC and BMET at this time.   COPD, DOE Admits to stable, chronic DOE. Continue current medication regimen.  Continue to follow-up  with Dr. Waymond Hailey.   3. Tobacco abuse Smoking cessation encouraged and discussed.     Dispo: Follow-up with me/APP in 3-4 months or sooner anything changes.  Signed, Lasalle Pointer, NP

## 2024-04-01 NOTE — Patient Instructions (Signed)
 Medication Instructions:  Your physician recommends that you continue on your current medications as directed. Please refer to the Current Medication list given to you today.  Labwork: In 2-3 weeks at [facility]   Testing/Procedures: Your physician has requested that you have an echocardiogram. Echocardiography is a painless test that uses sound waves to create images of your heart. It provides your doctor with information about the size and shape of your heart and how well your heart's chambers and valves are working. This procedure takes approximately one hour. There are no restrictions for this procedure. Please do NOT wear cologne, perfume, aftershave, or lotions (deodorant is allowed). Please arrive 15 minutes prior to your appointment time.  Please note: We ask at that you not bring children with you during ultrasound (echo/ vascular) testing. Due to room size and safety concerns, children are not allowed in the ultrasound rooms during exams. Our front office staff cannot provide observation of children in our lobby area while testing is being conducted. An adult accompanying a patient to their appointment will only be allowed in the ultrasound room at the discretion of the ultrasound technician under special circumstances. We apologize for any inconvenience.  Follow-Up: Your physician recommends that you schedule a follow-up appointment in: 3-4 months   Any Other Special Instructions Will Be Listed Below (If Applicable).  If you need a refill on your cardiac medications before your next appointment, please call your pharmacy.

## 2024-04-05 ENCOUNTER — Other Ambulatory Visit: Payer: Self-pay | Admitting: Cardiology

## 2024-04-15 DIAGNOSIS — I509 Heart failure, unspecified: Secondary | ICD-10-CM | POA: Diagnosis not present

## 2024-04-15 DIAGNOSIS — I5022 Chronic systolic (congestive) heart failure: Secondary | ICD-10-CM | POA: Diagnosis not present

## 2024-04-20 ENCOUNTER — Ambulatory Visit

## 2024-05-03 ENCOUNTER — Telehealth (HOSPITAL_COMMUNITY): Payer: Self-pay | Admitting: Cardiology

## 2024-05-03 NOTE — Telephone Encounter (Signed)
 Called to confirm/remind patient of their appointment [facility] on 05/03/24 .   Appointment:   [x] Confirmed  [] Left mess   [] No answer/No voice mail  [] VM Full/unable to leave message  [] Phone not in service  Patient reminded to bring all medications and/or complete list.  Confirmed patient has transportation. Gave directions, instructed to utilize valet parking.

## 2024-05-04 ENCOUNTER — Encounter (HOSPITAL_COMMUNITY): Admitting: Cardiology

## 2024-05-09 ENCOUNTER — Ambulatory Visit: Attending: Nurse Practitioner

## 2024-05-15 ENCOUNTER — Other Ambulatory Visit: Payer: Self-pay | Admitting: Internal Medicine

## 2024-05-25 ENCOUNTER — Ambulatory Visit: Attending: Cardiology

## 2024-05-25 DIAGNOSIS — I5022 Chronic systolic (congestive) heart failure: Secondary | ICD-10-CM | POA: Insufficient documentation

## 2024-05-25 DIAGNOSIS — I255 Ischemic cardiomyopathy: Secondary | ICD-10-CM | POA: Diagnosis not present

## 2024-05-26 LAB — ECHOCARDIOGRAM LIMITED
AV Peak grad: 7.4 mmHg
Ao pk vel: 1.36 m/s
Area-P 1/2: 5.09 cm2
Calc EF: 27.9 %
S' Lateral: 5.3 cm
Single Plane A2C EF: 25.7 %
Single Plane A4C EF: 31.1 %

## 2024-06-04 ENCOUNTER — Ambulatory Visit: Payer: Self-pay | Admitting: Nurse Practitioner

## 2024-06-18 ENCOUNTER — Other Ambulatory Visit: Payer: Self-pay | Admitting: Internal Medicine

## 2024-07-01 ENCOUNTER — Encounter: Payer: Self-pay | Admitting: Nurse Practitioner

## 2024-07-01 ENCOUNTER — Ambulatory Visit: Attending: Nurse Practitioner | Admitting: Nurse Practitioner

## 2024-07-01 VITALS — BP 118/64 | HR 69 | Ht 70.0 in | Wt 151.4 lb

## 2024-07-01 DIAGNOSIS — I4891 Unspecified atrial fibrillation: Secondary | ICD-10-CM | POA: Insufficient documentation

## 2024-07-01 DIAGNOSIS — J449 Chronic obstructive pulmonary disease, unspecified: Secondary | ICD-10-CM | POA: Insufficient documentation

## 2024-07-01 DIAGNOSIS — R0609 Other forms of dyspnea: Secondary | ICD-10-CM | POA: Diagnosis not present

## 2024-07-01 DIAGNOSIS — I5022 Chronic systolic (congestive) heart failure: Secondary | ICD-10-CM | POA: Diagnosis not present

## 2024-07-01 DIAGNOSIS — I255 Ischemic cardiomyopathy: Secondary | ICD-10-CM | POA: Diagnosis not present

## 2024-07-01 DIAGNOSIS — Z720 Tobacco use: Secondary | ICD-10-CM | POA: Diagnosis not present

## 2024-07-01 DIAGNOSIS — I2510 Atherosclerotic heart disease of native coronary artery without angina pectoris: Secondary | ICD-10-CM | POA: Insufficient documentation

## 2024-07-01 NOTE — Progress Notes (Signed)
 Cardiology Office Note:  .   Date: 07/01/2024 ID:  Dave [name], DOB 02-08-1956, MRN [id number] PCP: Rosamond Leta NOVAK, MD  [facility] Providers Cardiologist:  Alvan Carrier, MD    History of Present Illness: .   Dave [name] is a 68 y.o. male with a PMH of CAD, s/p CABG x 3 in 2010, A-fib, PAD, chronic systolic CHF, ICM, carotid artery stenosis, hypertension, former smoker, COPD, who presents today for follow-up appointment.  Last seen for preoperative cardiovascular risk assessment on October 23, 2022 with Damien Braver, NP.  He was pending dental extraction at that time.  Was overall doing well at the time.  Last seen by Dr. Carrier Alvan on June 18, 2022, had noted ongoing significant shortness of breath/dyspnea on exertion and was referred to pulmonary -missed his last appointment, had some soft BPs with some dizziness at times, Entresto  was lowered to 49/51 mg twice daily, due to borderline high potassium levels, Aldactone was not started.  In the interim, he has had several ED visits/hospital admissions.  More recently, he has had a recent hospital admission at [facility] for COPD exacerbation.  CT scan revealed stable right lower lobe nodule.  Echocardiogram in hospital revealed EF reduced at 35 to 40% with repeat echo revealing worsening EF to 25 to 30%.  Recommended to follow-up outpatient cardiologist.  09/25/2023 - Today presents for outpatient follow-up.  He presents today with his family member.  He is doing well, and states he quit smoking 18 days ago.  Family member says normally he would be wearing oxygen  for his office visit, but now he does not need to wear it, does wear it PRN.  They are requesting a referral to pulmonology as he is overdue for an office visit with his pulmonologist. Denies any chest pain, shortness of breath, palpitations, syncope, presyncope, dizziness, orthopnea, PND, swelling or significant weight changes, acute bleeding, or claudication.  10/30/2023  - Patient presents today for follow-up.  Patient has returned to smoking.  Daughter states ever since he returned to smoking he has noticed more shortness of breath.  Patient reports smoking about half pack per day.  Patient says he is winded and is requesting to leave office to go use his nebulizer. Denies any chest pain,  palpitations, syncope, presyncope, dizziness, orthopnea, PND, swelling or significant weight changes, acute bleeding, or claudication.   Hospitalized 10/2023 for acute respiratory failure. Transferred to [facility] and tx for COPD flaure, CHF exacerbation, possible aspiration PNA. Was intubated, tx with steroids and ABX as well as IV Lasix .   02/12/2024 - Doing pretty well from a cardiac standpoint. Says he is using a machine similar to a BIPAP to help with his breathing that helps. Admits to stable DOE. Denies any chest pain, palpitations, syncope, presyncope, dizziness, orthopnea, PND, swelling or significant weight changes, acute bleeding, or claudication. Continues to smoke, working on weaning off nicotine on his own.   04/01/2024 - Presents today for follow-up with his daughter. Doing well from a cardiac standpoint. Denies any chest pain, palpitations, syncope, presyncope, dizziness, orthopnea, PND, swelling or significant weight changes, acute bleeding, or claudication. Continues to smoke, daughter says he is weaning his nicotine usage. Tolerating medications well.   07/01/2024 -here for follow-up.  Overall doing well from a cardiac perspective-tells me his breathing is stable since I last saw him.  Per daughter's report, he has not followed up with the heart failure clinic as this appointment had to be rescheduled. Denies any chest pain,  palpitations, syncope, presyncope, dizziness, orthopnea, PND, swelling or significant weight changes, acute bleeding, or claudication. Continues to smoke. Tolerating medications well.  He remains on chronic oxygen  via nasal cannula.  ROS: Negative.  See  HPI.  Studies Reviewed: SABRA    EKG:  EKG Interpretation Date/Time:  Friday July 01 2024 15:16:30 EDT Ventricular Rate:  71 PR Interval:  160 QRS Duration:  146 QT Interval:  414 QTC Calculation: 449 R Axis:   176  Text Interpretation: Sinus rhythm with Premature atrial complexes Non-specific intra-ventricular conduction block Minimal voltage criteria for LVH, may be normal variant ( Cornell product ) Lateral infarct (cited on or before 19-Dec-2016) When compared with ECG of 25-Sep-2023 14:22, Sinus rhythm has replaced Atrial fibrillation QRS duration has increased Questionable change in initial forces of Lateral leads Confirmed by Miriam Norris [phone]) on 07/01/2024 3:25:15 PM   Limited Echo 05/2024: 1. Somewhat limited visualization of LV limites interpretation of LVEF  and wall motion. Consider limited study with contrast. . Left ventricular  ejection fraction, by estimation, is 30 to 35%. The left ventricle has  moderately decreased function. Left  ventricular endocardial border not optimally defined to evaluate regional  wall motion. The left ventricular internal cavity size was mildly dilated.  Left ventricular diastolic parameters are indeterminate. Elevated left  atrial pressure.   2. Right ventricular systolic function was not well visualized. The right  ventricular size is not well visualized.   3. The aortic valve was not well visualized.   4. Limited study, evaluate ventricular function   Comparison(s): A prior study was performed on 11/14/2021. EF 35-40%. Left  venticle internal cavity mildy dilated. Right ventricular systolic  function is mildly reduced.   Echo 10/31/2023 ([facility]): LVEF 25%, no LVH, grade 1 DD, normal RV systolic function, no valvular regurgitation or valvular stenosis.     Carotid duplex 10/2023: Summary:  Right Carotid: Velocities in the right ICA are consistent with a 1-39%  stenosis. Non-hemodynamically significant plaque <50% noted in the CCA. The  ECA appears >50% stenosed.   Left Carotid: Velocities in the left ICA are consistent with a 60-79%  stenosis. Non-hemodynamically significant plaque <50% noted in the CCA. The ECA appears >50% stenosed.   Vertebrals:  Right vertebral artery demonstrates antegrade flow. Left  vertebral color flow-poorly visualized.  Subclavians: Bilateral subclavian artery flow was disturbed.   *See table(s) above for measurements and observations.  Suggest follow up study in 12 months. 2D echocardiogram 09/2023 [facility]): 1. The left ventricle is moderately dilated in size with normal wall thickness. 2. The left ventricular systolic function is severely decreased, LVEF is visually estimated at 25-30%. 3. There is mild mitral valve regurgitation. 4. The aortic valve is trileaflet with mildly thickened leaflets with normal excursion. 5. The left atrium is mildly dilated in size. 6. The right ventricle is mildly dilated in size, with mildly reduced systolic function. 7. IVC size and inspiratory change suggest elevated right atrial pressure. (10-20 mmHg). Left Ventricle The left ventricle is moderately dilated in size with normal wall thickness. The left ventricular systolic function is severely decreased, LVEF is visually estimated at 25-30%. Left ventricular diastolic function cannot be accurately assessed. Right Ventricle The right ventricle is mildly dilated in size, with mildly reduced systolic function. Left Atrium The left atrium is mildly dilated in size. Right Atrium The right atrium is normal in size. Aortic Valve The aortic valve is trileaflet with mildly thickened leaflets with normal excursion. There is trivial aortic regurgitation. There is no  evidence of a significant transvalvular gradient. Mitral Valve The mitral valve leaflets are normal with normal leaflet mobility. There is mild mitral valve regurgitation. Tricuspid Valve The tricuspid valve leaflets are normal, with normal leaflet mobility. There is no  significant tricuspid regurgitation. The pulmonary systolic pressure cannot be estimated due to insufficient TR signal. Pulmonic Valve Pulmonary valve is not well visualized. There is no significant pulmonic regurgitation. There is no evidence of a significant transvalvular gradient. Aorta The aorta is normal in size in the visualized segments. Inferior Vena Cava IVC size and inspiratory change suggest elevated right atrial pressure. (10-20 mmHg). Pericardium/Pleural There is no pericardial effusion.  Carotid duplex 08/2022: Summary:  Right Carotid: Velocities in the right ICA are consistent with a 1-39%  stenosis.                The ECA appears >50% stenosed.   Left Carotid: Velocities in the left ICA are consistent with a 40-59%  stenosis.               Non-hemodynamically significant plaque <50% noted in the  CCA. The                ECA appears >50% stenosed.   Vertebrals:  Bilateral vertebral arteries demonstrate antegrade flow.  Subclavians: Normal flow hemodynamics were seen in bilateral subclavian               arteries.   *See table(s) above for measurements and observations.  Suggest follow up study in 12 months.  Echo 11/2021: 1. Left ventricular ejection fraction, by estimation, is 35 to 40%. The  left ventricle has moderately decreased function. The left ventricle  demonstrates regional wall motion abnormalities (see scoring  diagram/findings for description). The left  ventricular internal cavity size was mildly dilated. Left ventricular  diastolic parameters are consistent with Grade I diastolic dysfunction  (impaired relaxation).   2. Right ventricular systolic function is mildly reduced. The right  ventricular size is normal. There is normal pulmonary artery systolic  pressure. The estimated right ventricular systolic pressure is 27.2 mmHg.   3. Left atrial size was upper normal.   4. The mitral valve is grossly normal. Trivial mitral valve  regurgitation.   5. The  aortic valve is tricuspid. Aortic valve regurgitation is not  visualized.   6. The inferior vena cava is normal in size with greater than 50%  respiratory variability, suggesting right atrial pressure of 3 mmHg.   Comparison(s): Prior images unable to be directly viewed.  Right/left heart cath 12/2016: Conclusions: 1.  Significant 2-vessel native coronary artery disease, including moderate diffuse proximal LAD disease and chronic total occlusion of mid LAD and ostial RCA.  70% proximal D1 stenosis is also present. 2.  Moderate, non-obstructive disease involving LCx.  Small branch of OM2 is chronically occluded. 3.  Widely patent LIMA->LAD. 4.  Patent SVG->rPDA with mild diffuse disease and discrete 95% stenosis at the distal anastomosis. 5.  Chronically occluded SVG->D1. 6.  Mildly elevated left and right heart filling pressures, as well as moderate pulmonary hypertension. 7.  Normal to mildly reduced cardiac output. 8.  Successful PCI to distal SVG->rPDA anastomosis using a Resolute Onxy 4.0 x 12 mm drug-eluting stent with 0% residual stenosis and TIMI-3 flow.   Recommendations: 1.  Admit for overnight observation following PCI with severe systolic dysfunction. 2.  Dual antiplatelet therapy with aspirin  and ticagrelor  for at least 6-12 months, ideally indefinitely.  If patient experiences respiratory side effects from ticagrelor , he  could be switched to clopidogrel. 3.  Avoid further IV hydration today; restart diuresis tomorrow if renal function allows. 4.  Aggressive medical therapy for severe ischemic cardiomyopathy.  If renal function stable, can consider starting ACEI tomorrow. 5.  Close outpatient follow-up with Dr. Alvan.  Physical Exam:   VS:  BP (!) 122/52   Pulse 69   Ht 5' 10 (1.778 m)   Wt 151 lb 6.4 oz (68.7 kg)   SpO2 96%   BMI 21.72 kg/m    Wt Readings from Last 3 Encounters:  07/01/24 151 lb 6.4 oz (68.7 kg)  04/01/24 153 lb (69.4 kg)  02/12/24 154 lb (69.9 kg)     GEN: Thin, 68 year old male in no acute distress, on chronic oxygen  via nasal cannula NECK: No JVD; No carotid bruits CARDIAC: S1/S2, irregularly irregular rhythm, no murmurs, rubs, gallops RESPIRATORY:  Clear and diminished to auscultation  EXTREMITIES:  No edema; No deformity   ASSESSMENT AND PLAN: .    Chronic systolic CHF, ICM Stage C, NYHA class I-II symptoms.  Most recent EF was 30 to 35%.  Tells me he is still not interested in defibrillator.  He has not followed up with heart failure clinic since last office visit.  Euvolemic and well compensated on exam. Continue Digoxin, Farxiga , Lasix , Toprol  XL, and Entresto . GDMT limited d/t hx of BP trends.  No medication changes at this time.  Low sodium diet, fluid restriction <2L, and daily weights encouraged. Educated to contact our office for weight gain of 2 lbs overnight or 5 lbs in one week. Follow-up with HF clinic as scheduled.   2. CAD, s/p CABG Stable with no anginal symptoms. No indication for ischemic evaluation.  Not on aspirin  due to being on Eliquis .  No medication changes at this time.  Heart healthy diet recommended.  Care and ED precautions discussed.  3. A-fib Denies any tachycardia or palpitations.  Heart rates well-controlled today.  No medication changes at this time.  Continue Eliquis  for stroke prevention.  He is on appropriate dosage and denies any bleeding issues.  4. COPD, DOE Admits to stable, chronic DOE. Continue current medication regimen.  Continue to follow-up with Dr. Darlean.   5. Tobacco abuse Smoking cessation encouraged and discussed.    Dispo: Follow-up with me/APP in 2-3 months or sooner anything changes.  Signed, Almarie Crate, NP

## 2024-07-01 NOTE — Patient Instructions (Signed)

## 2024-07-08 ENCOUNTER — Telehealth (HOSPITAL_COMMUNITY): Payer: Self-pay | Admitting: Cardiology

## 2024-07-08 NOTE — Telephone Encounter (Signed)
 Called to confirm/remind patient of their appointment at [facility] on 07/08/24 .   Appointment:   [] Confirmed  [x] Left mess   [] No answer/No voice mail  [] VM Full/unable to leave message  [] Phone not in service  Patient reminded to bring all medications and/or complete list.  Confirmed patient has transportation. Gave directions, instructed to utilize valet parking.

## 2024-07-11 ENCOUNTER — Encounter (HOSPITAL_COMMUNITY): Admitting: Cardiology

## 2024-07-19 ENCOUNTER — Other Ambulatory Visit: Payer: Self-pay | Admitting: Internal Medicine

## 2024-07-22 DIAGNOSIS — R7989 Other specified abnormal findings of blood chemistry: Secondary | ICD-10-CM | POA: Diagnosis not present

## 2024-07-22 DIAGNOSIS — Z452 Encounter for adjustment and management of vascular access device: Secondary | ICD-10-CM | POA: Diagnosis not present

## 2024-07-22 DIAGNOSIS — J441 Chronic obstructive pulmonary disease with (acute) exacerbation: Secondary | ICD-10-CM | POA: Diagnosis not present

## 2024-07-22 DIAGNOSIS — J9601 Acute respiratory failure with hypoxia: Secondary | ICD-10-CM | POA: Diagnosis not present

## 2024-07-22 DIAGNOSIS — R0602 Shortness of breath: Secondary | ICD-10-CM | POA: Diagnosis not present

## 2024-07-22 DIAGNOSIS — R404 Transient alteration of awareness: Secondary | ICD-10-CM | POA: Diagnosis not present

## 2024-07-22 DIAGNOSIS — J90 Pleural effusion, not elsewhere classified: Secondary | ICD-10-CM | POA: Diagnosis not present

## 2024-07-22 DIAGNOSIS — A419 Sepsis, unspecified organism: Secondary | ICD-10-CM | POA: Diagnosis not present

## 2024-07-22 DIAGNOSIS — R9431 Abnormal electrocardiogram [ECG] [EKG]: Secondary | ICD-10-CM | POA: Diagnosis not present

## 2024-07-22 DIAGNOSIS — J9602 Acute respiratory failure with hypercapnia: Secondary | ICD-10-CM | POA: Diagnosis not present

## 2024-07-22 DIAGNOSIS — I447 Left bundle-branch block, unspecified: Secondary | ICD-10-CM | POA: Diagnosis not present

## 2024-07-22 DIAGNOSIS — I509 Heart failure, unspecified: Secondary | ICD-10-CM | POA: Diagnosis not present

## 2024-07-22 DIAGNOSIS — R4182 Altered mental status, unspecified: Secondary | ICD-10-CM | POA: Diagnosis not present

## 2024-07-22 DIAGNOSIS — Z4682 Encounter for fitting and adjustment of non-vascular catheter: Secondary | ICD-10-CM | POA: Diagnosis not present

## 2024-07-22 DIAGNOSIS — I110 Hypertensive heart disease with heart failure: Secondary | ICD-10-CM | POA: Diagnosis not present

## 2024-07-22 DIAGNOSIS — I700 Atherosclerosis of aorta: Secondary | ICD-10-CM | POA: Diagnosis not present

## 2024-07-22 DIAGNOSIS — Z79899 Other long term (current) drug therapy: Secondary | ICD-10-CM | POA: Diagnosis not present

## 2024-07-22 DIAGNOSIS — N390 Urinary tract infection, site not specified: Secondary | ICD-10-CM | POA: Diagnosis not present

## 2024-07-22 DIAGNOSIS — E785 Hyperlipidemia, unspecified: Secondary | ICD-10-CM | POA: Diagnosis not present

## 2024-07-22 DIAGNOSIS — F17210 Nicotine dependence, cigarettes, uncomplicated: Secondary | ICD-10-CM | POA: Diagnosis not present

## 2024-07-22 DIAGNOSIS — R231 Pallor: Secondary | ICD-10-CM | POA: Diagnosis not present

## 2024-07-22 DIAGNOSIS — I2510 Atherosclerotic heart disease of native coronary artery without angina pectoris: Secondary | ICD-10-CM | POA: Diagnosis not present

## 2024-07-22 DIAGNOSIS — J432 Centrilobular emphysema: Secondary | ICD-10-CM | POA: Diagnosis not present

## 2024-07-22 DIAGNOSIS — J439 Emphysema, unspecified: Secondary | ICD-10-CM | POA: Diagnosis not present

## 2024-07-22 DIAGNOSIS — Z1152 Encounter for screening for COVID-19: Secondary | ICD-10-CM | POA: Diagnosis not present

## 2024-07-22 NOTE — ED Triage Notes (Signed)
 Pt BIB EMS for [facility], pt is on bipap and 3L [place] at baseline. Pt became unresponsive en route to ED arrived assisting ventilations. EMS reports a possible L sided gaze prior to pt becoming unresponsive.

## 2024-07-22 NOTE — ED Notes (Signed)
 Bed: 002-TR Expected date: 07/22/24 Expected time: 10:31 PM Means of arrival:  Comments: EMS: [facility]

## 2024-07-23 ENCOUNTER — Inpatient Hospital Stay (HOSPITAL_COMMUNITY): Admit: 2024-07-23 | Admitting: Pulmonary Disease

## 2024-07-23 ENCOUNTER — Encounter (HOSPITAL_COMMUNITY): Payer: Self-pay

## 2024-07-23 DIAGNOSIS — A419 Sepsis, unspecified organism: Secondary | ICD-10-CM | POA: Diagnosis not present

## 2024-07-23 DIAGNOSIS — J432 Centrilobular emphysema: Secondary | ICD-10-CM | POA: Diagnosis not present

## 2024-07-23 DIAGNOSIS — J690 Pneumonitis due to inhalation of food and vomit: Secondary | ICD-10-CM | POA: Diagnosis not present

## 2024-07-23 DIAGNOSIS — Z9181 History of falling: Secondary | ICD-10-CM | POA: Diagnosis not present

## 2024-07-23 DIAGNOSIS — J449 Chronic obstructive pulmonary disease, unspecified: Secondary | ICD-10-CM | POA: Diagnosis not present

## 2024-07-23 DIAGNOSIS — Z452 Encounter for adjustment and management of vascular access device: Secondary | ICD-10-CM | POA: Diagnosis not present

## 2024-07-23 DIAGNOSIS — R0603 Acute respiratory distress: Secondary | ICD-10-CM | POA: Diagnosis not present

## 2024-07-23 DIAGNOSIS — R579 Shock, unspecified: Secondary | ICD-10-CM | POA: Diagnosis not present

## 2024-07-23 DIAGNOSIS — I5020 Unspecified systolic (congestive) heart failure: Secondary | ICD-10-CM | POA: Diagnosis not present

## 2024-07-23 DIAGNOSIS — J439 Emphysema, unspecified: Secondary | ICD-10-CM | POA: Diagnosis not present

## 2024-07-23 DIAGNOSIS — I429 Cardiomyopathy, unspecified: Secondary | ICD-10-CM | POA: Diagnosis not present

## 2024-07-23 DIAGNOSIS — J441 Chronic obstructive pulmonary disease with (acute) exacerbation: Secondary | ICD-10-CM | POA: Diagnosis not present

## 2024-07-23 DIAGNOSIS — R451 Restlessness and agitation: Secondary | ICD-10-CM | POA: Diagnosis not present

## 2024-07-23 DIAGNOSIS — Z4682 Encounter for fitting and adjustment of non-vascular catheter: Secondary | ICD-10-CM | POA: Diagnosis not present

## 2024-07-23 DIAGNOSIS — R4182 Altered mental status, unspecified: Secondary | ICD-10-CM | POA: Diagnosis not present

## 2024-07-23 DIAGNOSIS — G9341 Metabolic encephalopathy: Secondary | ICD-10-CM | POA: Diagnosis not present

## 2024-07-23 DIAGNOSIS — R918 Other nonspecific abnormal finding of lung field: Secondary | ICD-10-CM | POA: Diagnosis not present

## 2024-07-23 DIAGNOSIS — Z7189 Other specified counseling: Secondary | ICD-10-CM | POA: Diagnosis not present

## 2024-07-23 DIAGNOSIS — E875 Hyperkalemia: Secondary | ICD-10-CM | POA: Diagnosis not present

## 2024-07-23 DIAGNOSIS — J90 Pleural effusion, not elsewhere classified: Secondary | ICD-10-CM | POA: Diagnosis not present

## 2024-07-23 DIAGNOSIS — J9690 Respiratory failure, unspecified, unspecified whether with hypoxia or hypercapnia: Secondary | ICD-10-CM | POA: Diagnosis not present

## 2024-07-23 DIAGNOSIS — R0602 Shortness of breath: Secondary | ICD-10-CM | POA: Diagnosis not present

## 2024-07-23 DIAGNOSIS — J9621 Acute and chronic respiratory failure with hypoxia: Secondary | ICD-10-CM | POA: Diagnosis not present

## 2024-07-23 DIAGNOSIS — I358 Other nonrheumatic aortic valve disorders: Secondary | ICD-10-CM | POA: Diagnosis not present

## 2024-07-23 DIAGNOSIS — I509 Heart failure, unspecified: Secondary | ICD-10-CM | POA: Diagnosis not present

## 2024-07-23 DIAGNOSIS — Z515 Encounter for palliative care: Secondary | ICD-10-CM | POA: Diagnosis not present

## 2024-07-23 DIAGNOSIS — I480 Paroxysmal atrial fibrillation: Secondary | ICD-10-CM | POA: Diagnosis not present

## 2024-07-23 DIAGNOSIS — E870 Hyperosmolality and hypernatremia: Secondary | ICD-10-CM | POA: Diagnosis not present

## 2024-07-23 DIAGNOSIS — Z7901 Long term (current) use of anticoagulants: Secondary | ICD-10-CM | POA: Diagnosis not present

## 2024-07-23 DIAGNOSIS — I255 Ischemic cardiomyopathy: Secondary | ICD-10-CM | POA: Diagnosis not present

## 2024-07-23 DIAGNOSIS — J189 Pneumonia, unspecified organism: Secondary | ICD-10-CM | POA: Diagnosis not present

## 2024-07-23 DIAGNOSIS — Z5329 Procedure and treatment not carried out because of patient's decision for other reasons: Secondary | ICD-10-CM | POA: Diagnosis not present

## 2024-07-23 DIAGNOSIS — F419 Anxiety disorder, unspecified: Secondary | ICD-10-CM | POA: Diagnosis not present

## 2024-07-23 DIAGNOSIS — I4891 Unspecified atrial fibrillation: Secondary | ICD-10-CM | POA: Diagnosis not present

## 2024-07-23 DIAGNOSIS — F05 Delirium due to known physiological condition: Secondary | ICD-10-CM | POA: Diagnosis not present

## 2024-07-23 DIAGNOSIS — I4892 Unspecified atrial flutter: Secondary | ICD-10-CM | POA: Diagnosis not present

## 2024-07-23 DIAGNOSIS — J9602 Acute respiratory failure with hypercapnia: Secondary | ICD-10-CM | POA: Diagnosis not present

## 2024-07-23 DIAGNOSIS — Z9911 Dependence on respirator [ventilator] status: Secondary | ICD-10-CM | POA: Diagnosis not present

## 2024-07-23 DIAGNOSIS — I110 Hypertensive heart disease with heart failure: Secondary | ICD-10-CM | POA: Diagnosis not present

## 2024-07-23 DIAGNOSIS — G4733 Obstructive sleep apnea (adult) (pediatric): Secondary | ICD-10-CM | POA: Diagnosis not present

## 2024-07-23 DIAGNOSIS — G9340 Encephalopathy, unspecified: Secondary | ICD-10-CM | POA: Diagnosis not present

## 2024-07-23 DIAGNOSIS — J9622 Acute and chronic respiratory failure with hypercapnia: Secondary | ICD-10-CM | POA: Diagnosis not present

## 2024-07-23 DIAGNOSIS — I517 Cardiomegaly: Secondary | ICD-10-CM | POA: Diagnosis not present

## 2024-07-23 DIAGNOSIS — I4821 Permanent atrial fibrillation: Secondary | ICD-10-CM | POA: Diagnosis not present

## 2024-07-23 DIAGNOSIS — I5022 Chronic systolic (congestive) heart failure: Secondary | ICD-10-CM | POA: Diagnosis not present

## 2024-07-23 DIAGNOSIS — R531 Weakness: Secondary | ICD-10-CM | POA: Diagnosis not present

## 2024-07-23 DIAGNOSIS — I2510 Atherosclerotic heart disease of native coronary artery without angina pectoris: Secondary | ICD-10-CM | POA: Diagnosis not present

## 2024-07-23 DIAGNOSIS — J9601 Acute respiratory failure with hypoxia: Secondary | ICD-10-CM | POA: Diagnosis not present

## 2024-07-23 DIAGNOSIS — I5189 Other ill-defined heart diseases: Secondary | ICD-10-CM | POA: Diagnosis not present

## 2024-07-23 DIAGNOSIS — Z951 Presence of aortocoronary bypass graft: Secondary | ICD-10-CM | POA: Diagnosis not present

## 2024-07-23 DIAGNOSIS — F17210 Nicotine dependence, cigarettes, uncomplicated: Secondary | ICD-10-CM | POA: Diagnosis not present

## 2024-07-25 ENCOUNTER — Ambulatory Visit: Payer: Self-pay | Admitting: Internal Medicine

## 2024-07-25 NOTE — Telephone Encounter (Signed)
 CRM sent to the office to assist per CAL.      Copied from CRM #[id number]. Topic: General - Other >> Jul 25, 2024 11:46 AM Shona RAMAN wrote: Reason for CRM: patient daughter is calling to ask what rating is his bipap machine is on, Patient had an episode and is at [facility] and they urgently needs this information. They dont know the dme company please call to provide today Please call [phone]

## 2024-07-25 NOTE — Telephone Encounter (Signed)
 Called and spoke with Darryle pts daughter  the dpr and she stated she is unsure why this message was sent bc she knew dr wert would not have this information, pt never seen NP to est. Care for his Bipap, told her that's fine we are here to help. NFN

## 2024-08-08 DIAGNOSIS — R6521 Severe sepsis with septic shock: Secondary | ICD-10-CM | POA: Diagnosis not present

## 2024-08-08 DIAGNOSIS — J440 Chronic obstructive pulmonary disease with acute lower respiratory infection: Secondary | ICD-10-CM | POA: Diagnosis not present

## 2024-08-08 DIAGNOSIS — A419 Sepsis, unspecified organism: Secondary | ICD-10-CM | POA: Diagnosis not present

## 2024-08-08 DIAGNOSIS — R404 Transient alteration of awareness: Secondary | ICD-10-CM | POA: Diagnosis not present

## 2024-08-08 DIAGNOSIS — Z720 Tobacco use: Secondary | ICD-10-CM | POA: Diagnosis not present

## 2024-08-08 DIAGNOSIS — I739 Peripheral vascular disease, unspecified: Secondary | ICD-10-CM | POA: Diagnosis not present

## 2024-08-08 DIAGNOSIS — Z87891 Personal history of nicotine dependence: Secondary | ICD-10-CM | POA: Diagnosis not present

## 2024-08-08 DIAGNOSIS — R918 Other nonspecific abnormal finding of lung field: Secondary | ICD-10-CM | POA: Diagnosis not present

## 2024-08-08 DIAGNOSIS — J90 Pleural effusion, not elsewhere classified: Secondary | ICD-10-CM | POA: Diagnosis not present

## 2024-08-08 DIAGNOSIS — Z515 Encounter for palliative care: Secondary | ICD-10-CM | POA: Diagnosis not present

## 2024-08-08 DIAGNOSIS — J189 Pneumonia, unspecified organism: Secondary | ICD-10-CM | POA: Diagnosis not present

## 2024-08-08 DIAGNOSIS — J9621 Acute and chronic respiratory failure with hypoxia: Secondary | ICD-10-CM | POA: Diagnosis not present

## 2024-08-08 DIAGNOSIS — Z7901 Long term (current) use of anticoagulants: Secondary | ICD-10-CM | POA: Diagnosis not present

## 2024-08-08 DIAGNOSIS — I5023 Acute on chronic systolic (congestive) heart failure: Secondary | ICD-10-CM | POA: Diagnosis not present

## 2024-08-08 DIAGNOSIS — E785 Hyperlipidemia, unspecified: Secondary | ICD-10-CM | POA: Diagnosis not present

## 2024-08-08 DIAGNOSIS — I214 Non-ST elevation (NSTEMI) myocardial infarction: Secondary | ICD-10-CM | POA: Diagnosis not present

## 2024-08-08 DIAGNOSIS — I480 Paroxysmal atrial fibrillation: Secondary | ICD-10-CM | POA: Diagnosis not present

## 2024-08-08 DIAGNOSIS — Z79899 Other long term (current) drug therapy: Secondary | ICD-10-CM | POA: Diagnosis not present

## 2024-08-08 DIAGNOSIS — F17210 Nicotine dependence, cigarettes, uncomplicated: Secondary | ICD-10-CM | POA: Diagnosis not present

## 2024-08-08 DIAGNOSIS — I509 Heart failure, unspecified: Secondary | ICD-10-CM | POA: Diagnosis not present

## 2024-08-08 DIAGNOSIS — Z7982 Long term (current) use of aspirin: Secondary | ICD-10-CM | POA: Diagnosis not present

## 2024-08-08 DIAGNOSIS — J850 Gangrene and necrosis of lung: Secondary | ICD-10-CM | POA: Diagnosis not present

## 2024-08-08 DIAGNOSIS — J9601 Acute respiratory failure with hypoxia: Secondary | ICD-10-CM | POA: Diagnosis not present

## 2024-08-08 DIAGNOSIS — I110 Hypertensive heart disease with heart failure: Secondary | ICD-10-CM | POA: Diagnosis not present

## 2024-08-08 DIAGNOSIS — Z66 Do not resuscitate: Secondary | ICD-10-CM | POA: Diagnosis not present

## 2024-08-08 DIAGNOSIS — J9600 Acute respiratory failure, unspecified whether with hypoxia or hypercapnia: Secondary | ICD-10-CM | POA: Diagnosis not present

## 2024-08-08 DIAGNOSIS — J441 Chronic obstructive pulmonary disease with (acute) exacerbation: Secondary | ICD-10-CM | POA: Diagnosis not present

## 2024-08-08 DIAGNOSIS — R230 Cyanosis: Secondary | ICD-10-CM | POA: Diagnosis not present

## 2024-08-08 DIAGNOSIS — R069 Unspecified abnormalities of breathing: Secondary | ICD-10-CM | POA: Diagnosis not present

## 2024-08-08 DIAGNOSIS — R079 Chest pain, unspecified: Secondary | ICD-10-CM | POA: Diagnosis not present

## 2024-08-08 DIAGNOSIS — Z7951 Long term (current) use of inhaled steroids: Secondary | ICD-10-CM | POA: Diagnosis not present

## 2024-09-07 DEATH — deceased

## 2024-10-07 ENCOUNTER — Ambulatory Visit: Admitting: Nurse Practitioner
